# Patient Record
Sex: Female | Born: 1955 | Race: White | Hispanic: No | Marital: Married | Smoking: Former smoker
Health system: Southern US, Community
[De-identification: ages and names within clinical notes are randomized; demographics above are authoritative.]

## PROBLEM LIST (undated history)

## (undated) DIAGNOSIS — G9341 Metabolic encephalopathy: Secondary | ICD-10-CM

## (undated) DIAGNOSIS — E7800 Pure hypercholesterolemia, unspecified: Secondary | ICD-10-CM

## (undated) DIAGNOSIS — J939 Pneumothorax, unspecified: Secondary | ICD-10-CM

## (undated) DIAGNOSIS — U071 COVID-19: Secondary | ICD-10-CM

## (undated) DIAGNOSIS — J1282 Pneumonia due to coronavirus disease 2019: Secondary | ICD-10-CM

## (undated) DIAGNOSIS — K219 Gastro-esophageal reflux disease without esophagitis: Secondary | ICD-10-CM

## (undated) DIAGNOSIS — J9621 Acute and chronic respiratory failure with hypoxia: Secondary | ICD-10-CM

## (undated) DIAGNOSIS — Z972 Presence of dental prosthetic device (complete) (partial): Secondary | ICD-10-CM

## (undated) DIAGNOSIS — N200 Calculus of kidney: Secondary | ICD-10-CM

## (undated) HISTORY — PX: KIDNEY STONE SURGERY: SHX686

## (undated) HISTORY — PX: BREAST BIOPSY: SHX20

## (undated) HISTORY — PX: BREAST EXCISIONAL BIOPSY: SUR124

## (undated) HISTORY — PX: COLONOSCOPY: SHX174

## (undated) HISTORY — PX: ABDOMINAL HYSTERECTOMY: SHX81

---

## 2009-02-21 ENCOUNTER — Ambulatory Visit (HOSPITAL_COMMUNITY): Admission: RE | Admit: 2009-02-21 | Discharge: 2009-02-21 | Payer: Self-pay | Admitting: Family Medicine

## 2009-02-26 ENCOUNTER — Encounter: Admission: RE | Admit: 2009-02-26 | Discharge: 2009-02-26 | Payer: Self-pay | Admitting: Family Medicine

## 2009-02-26 ENCOUNTER — Encounter (INDEPENDENT_AMBULATORY_CARE_PROVIDER_SITE_OTHER): Payer: Self-pay | Admitting: Diagnostic Radiology

## 2009-03-25 ENCOUNTER — Ambulatory Visit (HOSPITAL_BASED_OUTPATIENT_CLINIC_OR_DEPARTMENT_OTHER): Admission: RE | Admit: 2009-03-25 | Discharge: 2009-03-25 | Payer: Self-pay | Admitting: Surgery

## 2009-03-25 ENCOUNTER — Encounter: Admission: RE | Admit: 2009-03-25 | Discharge: 2009-03-25 | Payer: Self-pay | Admitting: Surgery

## 2010-02-24 ENCOUNTER — Encounter: Admission: RE | Admit: 2010-02-24 | Discharge: 2010-02-24 | Payer: Self-pay | Admitting: Family Medicine

## 2010-07-15 LAB — BASIC METABOLIC PANEL
BUN: 7 mg/dL (ref 6–23)
CO2: 28 mEq/L (ref 19–32)
Calcium: 9.6 mg/dL (ref 8.4–10.5)
Chloride: 104 mEq/L (ref 96–112)
Creatinine, Ser: 0.74 mg/dL (ref 0.4–1.2)
GFR calc Af Amer: >60 mL/min (ref >60)
GFR calc non Af Amer: >60 mL/min (ref >60)
Glucose, Bld: 93 mg/dL (ref 70–99)
Potassium: 4.4 mEq/L (ref 3.5–5.1)
Sodium: 139 mEq/L (ref 135–145)

## 2010-07-15 LAB — DIFFERENTIAL
Basophils Absolute: 0 10*3/uL (ref 0.0–0.1)
Basophils Relative: 1 % (ref 0–1)
Eosinophils Absolute: 0.1 10*3/uL (ref 0.0–0.7)
Eosinophils Relative: 2 % (ref 0–5)
Lymphocytes Relative: 43 % (ref 12–46)
Lymphs Abs: 2.4 10*3/uL (ref 0.7–4.0)
Monocytes Absolute: 0.6 10*3/uL (ref 0.1–1.0)
Monocytes Relative: 11 % (ref 3–12)
Neutro Abs: 2.4 10*3/uL (ref 1.7–7.7)
Neutrophils Relative %: 43 % (ref 43–77)

## 2010-07-15 LAB — CBC
HCT: 40.1 % (ref 36.0–46.0)
Hemoglobin: 14 g/dL (ref 12.0–15.0)
MCHC: 34.8 g/dL (ref 30.0–36.0)
MCV: 90.6 fL (ref 78.0–100.0)
Platelets: 223 10*3/uL (ref 150–400)
RBC: 4.43 MIL/uL (ref 3.87–5.11)
RDW: 12.2 % (ref 11.5–15.5)
WBC: 5.5 10*3/uL (ref 4.0–10.5)

## 2011-01-08 ENCOUNTER — Other Ambulatory Visit: Payer: Self-pay | Admitting: Family Medicine

## 2011-01-08 DIAGNOSIS — Z1231 Encounter for screening mammogram for malignant neoplasm of breast: Secondary | ICD-10-CM

## 2011-02-26 ENCOUNTER — Ambulatory Visit
Admission: RE | Admit: 2011-02-26 | Discharge: 2011-02-26 | Disposition: A | Discharge status: Home / Self Care | Payer: 59 | Source: Ambulatory Visit | Attending: Family Medicine | Admitting: Family Medicine

## 2011-02-26 DIAGNOSIS — Z1231 Encounter for screening mammogram for malignant neoplasm of breast: Secondary | ICD-10-CM

## 2012-01-18 ENCOUNTER — Other Ambulatory Visit: Payer: Self-pay | Admitting: Family Medicine

## 2012-01-18 DIAGNOSIS — Z1231 Encounter for screening mammogram for malignant neoplasm of breast: Secondary | ICD-10-CM

## 2012-02-29 ENCOUNTER — Ambulatory Visit
Admission: RE | Admit: 2012-02-29 | Discharge: 2012-02-29 | Disposition: A | Discharge status: Home / Self Care | Payer: 59 | Source: Ambulatory Visit | Attending: Family Medicine | Admitting: Family Medicine

## 2012-02-29 DIAGNOSIS — Z1231 Encounter for screening mammogram for malignant neoplasm of breast: Secondary | ICD-10-CM

## 2012-03-04 ENCOUNTER — Other Ambulatory Visit: Payer: Self-pay | Admitting: Family Medicine

## 2012-03-04 DIAGNOSIS — R928 Other abnormal and inconclusive findings on diagnostic imaging of breast: Secondary | ICD-10-CM

## 2012-03-15 ENCOUNTER — Ambulatory Visit
Admission: RE | Admit: 2012-03-15 | Discharge: 2012-03-15 | Disposition: A | Discharge status: Home / Self Care | Payer: 59 | Source: Ambulatory Visit | Attending: Family Medicine | Admitting: Family Medicine

## 2012-03-15 DIAGNOSIS — R928 Other abnormal and inconclusive findings on diagnostic imaging of breast: Secondary | ICD-10-CM

## 2013-02-03 ENCOUNTER — Other Ambulatory Visit: Payer: Self-pay

## 2013-02-03 DIAGNOSIS — Z1231 Encounter for screening mammogram for malignant neoplasm of breast: Secondary | ICD-10-CM

## 2013-03-07 ENCOUNTER — Ambulatory Visit
Admission: RE | Admit: 2013-03-07 | Discharge: 2013-03-07 | Disposition: A | Discharge status: Home / Self Care | Payer: BC Managed Care – PPO | Source: Ambulatory Visit

## 2013-03-07 DIAGNOSIS — Z1231 Encounter for screening mammogram for malignant neoplasm of breast: Secondary | ICD-10-CM

## 2014-01-31 ENCOUNTER — Other Ambulatory Visit: Payer: Self-pay

## 2014-01-31 DIAGNOSIS — Z1231 Encounter for screening mammogram for malignant neoplasm of breast: Secondary | ICD-10-CM

## 2014-03-12 ENCOUNTER — Ambulatory Visit
Admission: RE | Admit: 2014-03-12 | Discharge: 2014-03-12 | Disposition: A | Discharge status: Home / Self Care | Payer: BC Managed Care – PPO | Source: Ambulatory Visit

## 2014-03-12 ENCOUNTER — Encounter (INDEPENDENT_AMBULATORY_CARE_PROVIDER_SITE_OTHER): Payer: Self-pay

## 2014-03-12 DIAGNOSIS — Z1231 Encounter for screening mammogram for malignant neoplasm of breast: Secondary | ICD-10-CM

## 2015-02-08 ENCOUNTER — Other Ambulatory Visit: Payer: Self-pay

## 2015-02-08 DIAGNOSIS — Z1231 Encounter for screening mammogram for malignant neoplasm of breast: Secondary | ICD-10-CM

## 2015-03-19 ENCOUNTER — Ambulatory Visit
Admission: RE | Admit: 2015-03-19 | Discharge: 2015-03-19 | Disposition: A | Discharge status: Home / Self Care | Payer: BLUE CROSS/BLUE SHIELD | Source: Ambulatory Visit

## 2015-03-19 DIAGNOSIS — Z1231 Encounter for screening mammogram for malignant neoplasm of breast: Secondary | ICD-10-CM

## 2015-04-10 ENCOUNTER — Telehealth: Payer: Self-pay | Admitting: Gastroenterology

## 2015-04-10 NOTE — Telephone Encounter (Signed)
colonoscopy

## 2015-04-12 ENCOUNTER — Encounter: Payer: Self-pay | Admitting: *Deleted

## 2015-04-12 ENCOUNTER — Other Ambulatory Visit: Payer: Self-pay

## 2015-04-12 NOTE — Telephone Encounter (Signed)
Gastroenterology Pre-Procedure Review  Request Date: 04/22/15 Requesting Physician:   PATIENT REVIEW QUESTIONS: The patient responded to the following health history questions as indicated:    1. Are you having any GI issues? no 2. Do you have a personal history of Polyps? yes (last done Feb 21, 2007) 3. Do you have a family history of Colon Cancer or Polyps? no 4. Diabetes Mellitus? no 5. Joint replacements in the past 12 months?no 6. Major health problems in the past 3 months?no 7. Any artificial heart valves, MVP, or defibrillator?no    MEDICATIONS & ALLERGIES:    Patient reports the following regarding taking any anticoagulation/antiplatelet therapy:   Plavix, Coumadin, Eliquis, Xarelto, Lovenox, Pradaxa, Brilinta, or Effient? no Aspirin? no  Patient confirms/reports the following medications:  Current Outpatient Prescriptions  Medication Sig Dispense Refill  . Krill Oil 1000 MG CAPS Take by mouth.     No current facility-administered medications for this visit.    Patient confirms/reports the following allergies:  No Known Allergies  No orders of the defined types were placed in this encounter.    AUTHORIZATION INFORMATION Primary Insurance: 1D#: Group #:  Secondary Insurance: 1D#: Group #:  SCHEDULE INFORMATION: Date: 04/22/15 Time: Location:  [facility]

## 2015-04-18 NOTE — Discharge Instructions (Signed)

## 2015-04-22 ENCOUNTER — Ambulatory Visit: Payer: BLUE CROSS/BLUE SHIELD | Admitting: Anesthesiology

## 2015-04-22 ENCOUNTER — Ambulatory Visit
Admission: RE | Admit: 2015-04-22 | Discharge: 2015-04-22 | Disposition: A | Discharge status: Home / Self Care | Payer: BLUE CROSS/BLUE SHIELD | Source: Ambulatory Visit | Attending: Gastroenterology | Admitting: Gastroenterology

## 2015-04-22 ENCOUNTER — Encounter
Admission: RE | Disposition: A | Discharge status: Home / Self Care | Payer: Self-pay | Source: Ambulatory Visit | Attending: Gastroenterology

## 2015-04-22 DIAGNOSIS — K219 Gastro-esophageal reflux disease without esophagitis: Secondary | ICD-10-CM | POA: Diagnosis not present

## 2015-04-22 DIAGNOSIS — K641 Second degree hemorrhoids: Secondary | ICD-10-CM | POA: Insufficient documentation

## 2015-04-22 DIAGNOSIS — Z1211 Encounter for screening for malignant neoplasm of colon: Secondary | ICD-10-CM | POA: Insufficient documentation

## 2015-04-22 DIAGNOSIS — Z90710 Acquired absence of both cervix and uterus: Secondary | ICD-10-CM | POA: Insufficient documentation

## 2015-04-22 DIAGNOSIS — E7800 Pure hypercholesterolemia, unspecified: Secondary | ICD-10-CM | POA: Insufficient documentation

## 2015-04-22 DIAGNOSIS — K5730 Diverticulosis of large intestine without perforation or abscess without bleeding: Secondary | ICD-10-CM | POA: Diagnosis not present

## 2015-04-22 DIAGNOSIS — Z87891 Personal history of nicotine dependence: Secondary | ICD-10-CM | POA: Insufficient documentation

## 2015-04-22 HISTORY — DX: Calculus of kidney: N20.0

## 2015-04-22 HISTORY — DX: Pure hypercholesterolemia, unspecified: E78.00

## 2015-04-22 HISTORY — PX: COLONOSCOPY WITH PROPOFOL: SHX5780

## 2015-04-22 HISTORY — DX: Gastro-esophageal reflux disease without esophagitis: K21.9

## 2015-04-22 HISTORY — DX: Presence of dental prosthetic device (complete) (partial): Z97.2

## 2015-04-22 SURGERY — COLONOSCOPY WITH PROPOFOL
Anesthesia: Monitor Anesthesia Care | Wound class: Contaminated

## 2015-04-22 MED ORDER — SIMETHICONE 40 MG/0.6ML PO SUSP
ORAL | Status: DC | PRN
  Administered 2015-04-22: 12:00:00

## 2015-04-22 MED ORDER — LIDOCAINE HCL (CARDIAC) 20 MG/ML IV SOLN
INTRAVENOUS | Status: DC | PRN
  Administered 2015-04-22: 20 mg via INTRAVENOUS

## 2015-04-22 MED ORDER — ACETAMINOPHEN 325 MG PO TABS: 325.0000 mg | ORAL_TABLET | ORAL | Status: DC | PRN

## 2015-04-22 MED ORDER — LACTATED RINGERS IV SOLN
INTRAVENOUS | Status: DC
  Administered 2015-04-22: 11:00:00 via INTRAVENOUS

## 2015-04-22 MED ORDER — ACETAMINOPHEN 160 MG/5ML PO SOLN: 325.0000 mg | ORAL | Status: DC | PRN

## 2015-04-22 MED ORDER — PROPOFOL 10 MG/ML IV BOLUS
INTRAVENOUS | Status: DC | PRN
  Administered 2015-04-22: 100 mg via INTRAVENOUS
  Administered 2015-04-22 (×4): 20 mg via INTRAVENOUS
  Administered 2015-04-22: 40 mg via INTRAVENOUS
  Administered 2015-04-22 (×2): 20 mg via INTRAVENOUS

## 2015-04-22 SURGICAL SUPPLY — 28 items

## 2015-04-22 NOTE — H&P (Signed)
  [facility]  [address] [place], [place] [address] Phone: [phone] Fax : [phone]  Primary Care Physician:  No primary care provider on file. Primary Gastroenterologist:  Dr. Servando SnareWohl  Pre-Procedure History & Physical: HPI:  Rachel [name] is a 60 y.o. female is here for a screening colonoscopy.   Past Medical History  Diagnosis Date  . GERD (gastroesophageal reflux disease)   . Wears dentures     full upper, partial lower  . Kidney stones   . Hypercholesteremia     Past Surgical History  Procedure Laterality Date  . Abdominal hysterectomy    . Colonoscopy    . Kidney stone surgery      Prior to Admission medications   Medication Sig Start Date End Date Taking? Authorizing Provider  Ca Carbonate-Mag Hydroxide (ROLAIDS PO) Take by mouth.   Yes Historical Provider, MD  Boris LownKrill Oil 1000 MG CAPS Take by mouth.   Yes Historical Provider, MD    Allergies as of 04/12/2015  . (No Known Allergies)    History reviewed. No pertinent family history.  Social History   Social History  . Marital Status: Married    Spouse Name: N/A  . Number of Children: N/A  . Years of Education: N/A   Occupational History  . Not on file.   Social History Main Topics  . Smoking status: Former Smoker    Types: Cigarettes  . Smokeless tobacco: Not on file     Comment: quit 2005  . Alcohol Use: No  . Drug Use: Not on file  . Sexual Activity: Not on file   Other Topics Concern  . Not on file   Social History Narrative    Review of Systems: See HPI, otherwise negative ROS  Physical Exam: BP 142/86 mmHg  Pulse 69  Temp(Src) 97.9 F (36.6 C)  Resp 16  Ht 5\' 4"  (1.626 m)  Wt 141 lb (63.957 kg)  BMI 24.19 kg/m2  SpO2 98% General:   Alert,  pleasant and cooperative in NAD Head:  Normocephalic and atraumatic. Neck:  Supple; no masses or thyromegaly. Lungs:  Clear throughout to auscultation.    Heart:  Regular rate and rhythm. Abdomen:  Soft,  nontender and nondistended. Normal bowel sounds, without guarding, and without rebound.   Neurologic:  Alert and  oriented x4;  grossly normal neurologically.  Impression/Plan: Rachel [name] is now here to undergo a screening colonoscopy.  Risks, benefits, and alternatives regarding colonoscopy have been reviewed with the patient.  Questions have been answered.  All parties agreeable.

## 2015-04-22 NOTE — Transfer of Care (Signed)
Immediate Anesthesia Transfer of Care Note  Patient: Rachel [name]  Procedure(s) Performed: Procedure(s): COLONOSCOPY WITH PROPOFOL (N/A)  Patient Location: PACU  Anesthesia Type: MAC  Level of Consciousness: awake, alert  and patient cooperative  Airway and Oxygen Therapy: Patient Spontanous Breathing and Patient connected to supplemental oxygen  Post-op Assessment: Post-op Vital signs reviewed, Patient's Cardiovascular Status Stable, Respiratory Function Stable, Patent Airway and No signs of Nausea or vomiting  Post-op Vital Signs: Reviewed and stable  Complications: No apparent anesthesia complications

## 2015-04-22 NOTE — Op Note (Signed)
[facility] Gastroenterology Patient Name: Rachel [name] Procedure Date: 04/22/2015 12:12 PM MRN: [id number] Account #: [id number] Date of Birth: [birth year] Admit Type: Outpatient Age: 60 Room: [facility] OR ROOM 01 Gender: Female Note Status: Finalized Procedure:         Colonoscopy Indications:       Screening for colorectal malignant neoplasm Providers:         Midge Minium, MD Medicines:         Propofol per Anesthesia Complications:     No immediate complications. Procedure:         Pre-Anesthesia Assessment:                    - Prior to the procedure, a History and Physical was                     performed, and patient medications and allergies were                     reviewed. The patient's tolerance of previous anesthesia                     was also reviewed. The risks and benefits of the procedure                     and the sedation options and risks were discussed with the                     patient. All questions were answered, and informed consent                     was obtained. Prior Anticoagulants: The patient has taken                     no previous anticoagulant or antiplatelet agents. ASA                     Grade Assessment: II - A patient with mild systemic                     disease. After reviewing the risks and benefits, the                     patient was deemed in satisfactory condition to undergo                     the procedure.                    After obtaining informed consent, the colonoscope was                     passed under direct vision. Throughout the procedure, the                     patient's blood pressure, pulse, and oxygen saturations                     were monitored continuously. The was introduced through                     the anus and advanced to the the cecum, identified by                     appendiceal orifice and ileocecal valve. The  colonoscopy                     was performed without difficulty. The  patient tolerated                     the procedure well. The quality of the bowel preparation                     was excellent. Findings:      The perianal and digital rectal examinations were normal.      A few small-mouthed diverticula were found in the entire colon.      Non-bleeding internal hemorrhoids were found during retroflexion. The       hemorrhoids were Grade II (internal hemorrhoids that prolapse but reduce       spontaneously). Impression:        - Diverticulosis in the entire examined colon.                    - Non-bleeding internal hemorrhoids.                    - No specimens collected. Recommendation:    - Repeat colonoscopy in 10 years for screening unless any                     change in family history or lower GI problems. Procedure Code(s): --- Professional ---                    [phone], Colonoscopy, flexible; diagnostic, including                     collection of specimen(s) by brushing or washing, when                     performed (separate procedure) Diagnosis Code(s): --- Professional ---                    Z12.11, Encounter for screening for malignant neoplasm of                     colon CPT copyright 2014 American Medical Association. All rights reserved. The codes documented in this report are preliminary and upon coder review may  be revised to meet current compliance requirements. Midge Miniumarren Dayson Aboud, MD 04/22/2015 12:37:27 PM This report has been signed electronically. Number of Addenda: 0 Note Initiated On: 04/22/2015 12:12 PM Scope Withdrawal Time: 0 hours 5 minutes 4 seconds  Total Procedure Duration: 0 hours 11 minutes 34 seconds       [facility]

## 2015-04-22 NOTE — Anesthesia Postprocedure Evaluation (Signed)
Anesthesia Post Note  Patient: Corwin LevinsVickie M Raffield  Procedure(s) Performed: Procedure(s) (LRB): COLONOSCOPY WITH PROPOFOL (N/A)  Patient location during evaluation: PACU Anesthesia Type: MAC Level of consciousness: awake and alert and oriented Pain management: satisfactory to patient Vital Signs Assessment: post-procedure vital signs reviewed and stable Respiratory status: spontaneous breathing, nonlabored ventilation and respiratory function stable Cardiovascular status: blood pressure returned to baseline and stable Postop Assessment: Adequate PO intake and No signs of nausea or vomiting Anesthetic complications: no    Cherly BeachStella, Trinidad Ingle J

## 2015-04-22 NOTE — Anesthesia Preprocedure Evaluation (Signed)
Anesthesia Evaluation  Patient identified by MRN, date of birth, ID band  Reviewed: Allergy & Precautions, H&P , NPO status , Patient's Chart, lab work & pertinent test results  Airway Mallampati: II  TM Distance: >3 FB Neck ROM: full    Dental no notable dental hx.    Pulmonary former smoker,    Pulmonary exam normal        Cardiovascular  Rhythm:regular Rate:Normal     Neuro/Psych    GI/Hepatic GERD  ,  Endo/Other    Renal/GU      Musculoskeletal   Abdominal   Peds  Hematology   Anesthesia Other Findings   Reproductive/Obstetrics                             Anesthesia Physical Anesthesia Plan  ASA: II  Anesthesia Plan: MAC   Post-op Pain Management:    Induction:   Airway Management Planned:   Additional Equipment:   Intra-op Plan:   Post-operative Plan:   Informed Consent: I have reviewed the patients History and Physical, chart, labs and discussed the procedure including the risks, benefits and alternatives for the proposed anesthesia with the patient or authorized representative who has indicated his/her understanding and acceptance.     Plan Discussed with: CRNA  Anesthesia Plan Comments:         Anesthesia Quick Evaluation  

## 2015-04-22 NOTE — Anesthesia Procedure Notes (Signed)
Procedure Name: MAC Performed by: Lorana Maffeo Pre-anesthesia Checklist: Patient identified, Emergency Drugs available, Suction available, Patient being monitored and Timeout performed Patient Re-evaluated:Patient Re-evaluated prior to inductionOxygen Delivery Method: Nasal cannula       

## 2015-04-23 ENCOUNTER — Encounter: Payer: Self-pay | Admitting: Gastroenterology

## 2016-02-04 ENCOUNTER — Other Ambulatory Visit: Payer: Self-pay | Admitting: Physician Assistant

## 2016-02-04 DIAGNOSIS — Z1231 Encounter for screening mammogram for malignant neoplasm of breast: Secondary | ICD-10-CM

## 2016-03-23 ENCOUNTER — Ambulatory Visit
Admission: RE | Admit: 2016-03-23 | Discharge: 2016-03-23 | Disposition: A | Discharge status: Home / Self Care | Payer: BLUE CROSS/BLUE SHIELD | Source: Ambulatory Visit | Attending: Physician Assistant | Admitting: Physician Assistant

## 2016-03-23 DIAGNOSIS — Z1231 Encounter for screening mammogram for malignant neoplasm of breast: Secondary | ICD-10-CM

## 2017-02-08 ENCOUNTER — Other Ambulatory Visit: Payer: Self-pay | Admitting: Physician Assistant

## 2017-02-08 DIAGNOSIS — Z1231 Encounter for screening mammogram for malignant neoplasm of breast: Secondary | ICD-10-CM

## 2017-03-24 ENCOUNTER — Ambulatory Visit
Admission: RE | Admit: 2017-03-24 | Discharge: 2017-03-24 | Disposition: A | Discharge status: Home / Self Care | Payer: BLUE CROSS/BLUE SHIELD | Source: Ambulatory Visit | Attending: Physician Assistant | Admitting: Physician Assistant

## 2017-03-24 DIAGNOSIS — Z1231 Encounter for screening mammogram for malignant neoplasm of breast: Secondary | ICD-10-CM

## 2018-02-03 ENCOUNTER — Other Ambulatory Visit: Payer: Self-pay | Admitting: Physician Assistant

## 2018-02-03 DIAGNOSIS — Z1231 Encounter for screening mammogram for malignant neoplasm of breast: Secondary | ICD-10-CM

## 2018-03-28 ENCOUNTER — Other Ambulatory Visit: Payer: Self-pay | Admitting: Family Medicine

## 2018-03-28 ENCOUNTER — Ambulatory Visit
Admission: RE | Admit: 2018-03-28 | Discharge: 2018-03-28 | Disposition: A | Discharge status: Home / Self Care | Payer: BLUE CROSS/BLUE SHIELD | Source: Ambulatory Visit | Attending: Physician Assistant | Admitting: Physician Assistant

## 2018-03-28 DIAGNOSIS — Z1231 Encounter for screening mammogram for malignant neoplasm of breast: Secondary | ICD-10-CM

## 2019-02-24 ENCOUNTER — Other Ambulatory Visit: Payer: Self-pay | Admitting: Family Medicine

## 2019-02-24 DIAGNOSIS — Z1231 Encounter for screening mammogram for malignant neoplasm of breast: Secondary | ICD-10-CM

## 2019-04-21 ENCOUNTER — Ambulatory Visit
Admission: RE | Admit: 2019-04-21 | Discharge: 2019-04-21 | Disposition: A | Discharge status: Home / Self Care | Payer: BLUE CROSS/BLUE SHIELD | Source: Ambulatory Visit | Attending: Family Medicine | Admitting: Family Medicine

## 2019-04-21 ENCOUNTER — Other Ambulatory Visit: Payer: Self-pay

## 2019-04-21 DIAGNOSIS — Z1231 Encounter for screening mammogram for malignant neoplasm of breast: Secondary | ICD-10-CM

## 2020-05-31 ENCOUNTER — Emergency Department (HOSPITAL_COMMUNITY): Payer: BLUE CROSS/BLUE SHIELD

## 2020-05-31 ENCOUNTER — Encounter (HOSPITAL_COMMUNITY): Payer: Self-pay | Admitting: Radiology

## 2020-05-31 ENCOUNTER — Inpatient Hospital Stay (HOSPITAL_COMMUNITY)
Admission: EM | Admit: 2020-05-31 | Discharge: 2020-07-09 | DRG: 004 | Disposition: A | Discharge status: Other Acute Inpatient Hospital | Payer: BLUE CROSS/BLUE SHIELD | Attending: Pulmonary Disease | Admitting: Pulmonary Disease

## 2020-05-31 DIAGNOSIS — Z9911 Dependence on respirator [ventilator] status: Secondary | ICD-10-CM

## 2020-05-31 DIAGNOSIS — J80 Acute respiratory distress syndrome: Secondary | ICD-10-CM | POA: Diagnosis not present

## 2020-05-31 DIAGNOSIS — J13 Pneumonia due to Streptococcus pneumoniae: Secondary | ICD-10-CM | POA: Diagnosis not present

## 2020-05-31 DIAGNOSIS — R0689 Other abnormalities of breathing: Secondary | ICD-10-CM | POA: Diagnosis not present

## 2020-05-31 DIAGNOSIS — J984 Other disorders of lung: Secondary | ICD-10-CM | POA: Diagnosis not present

## 2020-05-31 DIAGNOSIS — E860 Dehydration: Secondary | ICD-10-CM | POA: Diagnosis present

## 2020-05-31 DIAGNOSIS — E870 Hyperosmolality and hypernatremia: Secondary | ICD-10-CM | POA: Diagnosis not present

## 2020-05-31 DIAGNOSIS — J939 Pneumothorax, unspecified: Secondary | ICD-10-CM | POA: Diagnosis not present

## 2020-05-31 DIAGNOSIS — E872 Acidosis: Secondary | ICD-10-CM | POA: Diagnosis not present

## 2020-05-31 DIAGNOSIS — R739 Hyperglycemia, unspecified: Secondary | ICD-10-CM | POA: Diagnosis not present

## 2020-05-31 DIAGNOSIS — E162 Hypoglycemia, unspecified: Secondary | ICD-10-CM | POA: Diagnosis not present

## 2020-05-31 DIAGNOSIS — R7989 Other specified abnormal findings of blood chemistry: Secondary | ICD-10-CM

## 2020-05-31 DIAGNOSIS — R042 Hemoptysis: Secondary | ICD-10-CM | POA: Diagnosis not present

## 2020-05-31 DIAGNOSIS — E876 Hypokalemia: Secondary | ICD-10-CM | POA: Diagnosis present

## 2020-05-31 DIAGNOSIS — Z452 Encounter for adjustment and management of vascular access device: Secondary | ICD-10-CM

## 2020-05-31 DIAGNOSIS — N200 Calculus of kidney: Secondary | ICD-10-CM

## 2020-05-31 DIAGNOSIS — Z66 Do not resuscitate: Secondary | ICD-10-CM

## 2020-05-31 DIAGNOSIS — J189 Pneumonia, unspecified organism: Secondary | ICD-10-CM | POA: Diagnosis not present

## 2020-05-31 DIAGNOSIS — D696 Thrombocytopenia, unspecified: Secondary | ICD-10-CM | POA: Diagnosis not present

## 2020-05-31 DIAGNOSIS — E875 Hyperkalemia: Secondary | ICD-10-CM | POA: Diagnosis not present

## 2020-05-31 DIAGNOSIS — E871 Hypo-osmolality and hyponatremia: Secondary | ICD-10-CM | POA: Diagnosis present

## 2020-05-31 DIAGNOSIS — Z930 Tracheostomy status: Secondary | ICD-10-CM

## 2020-05-31 DIAGNOSIS — G9341 Metabolic encephalopathy: Secondary | ICD-10-CM | POA: Diagnosis present

## 2020-05-31 DIAGNOSIS — U071 COVID-19: Secondary | ICD-10-CM | POA: Diagnosis not present

## 2020-05-31 DIAGNOSIS — I959 Hypotension, unspecified: Secondary | ICD-10-CM | POA: Diagnosis not present

## 2020-05-31 DIAGNOSIS — G4700 Insomnia, unspecified: Secondary | ICD-10-CM | POA: Diagnosis not present

## 2020-05-31 DIAGNOSIS — Y95 Nosocomial condition: Secondary | ICD-10-CM | POA: Diagnosis not present

## 2020-05-31 DIAGNOSIS — E785 Hyperlipidemia, unspecified: Secondary | ICD-10-CM | POA: Diagnosis present

## 2020-05-31 DIAGNOSIS — R578 Other shock: Secondary | ICD-10-CM | POA: Diagnosis not present

## 2020-05-31 DIAGNOSIS — R0600 Dyspnea, unspecified: Secondary | ICD-10-CM

## 2020-05-31 DIAGNOSIS — K5900 Constipation, unspecified: Secondary | ICD-10-CM | POA: Diagnosis not present

## 2020-05-31 DIAGNOSIS — I4510 Unspecified right bundle-branch block: Secondary | ICD-10-CM | POA: Diagnosis present

## 2020-05-31 DIAGNOSIS — R069 Unspecified abnormalities of breathing: Secondary | ICD-10-CM

## 2020-05-31 DIAGNOSIS — J9312 Secondary spontaneous pneumothorax: Secondary | ICD-10-CM | POA: Diagnosis not present

## 2020-05-31 DIAGNOSIS — E861 Hypovolemia: Secondary | ICD-10-CM | POA: Diagnosis present

## 2020-05-31 DIAGNOSIS — J9621 Acute and chronic respiratory failure with hypoxia: Secondary | ICD-10-CM | POA: Diagnosis not present

## 2020-05-31 DIAGNOSIS — R040 Epistaxis: Secondary | ICD-10-CM | POA: Diagnosis not present

## 2020-05-31 DIAGNOSIS — R0603 Acute respiratory distress: Secondary | ICD-10-CM | POA: Diagnosis not present

## 2020-05-31 DIAGNOSIS — R0902 Hypoxemia: Secondary | ICD-10-CM

## 2020-05-31 DIAGNOSIS — A419 Sepsis, unspecified organism: Secondary | ICD-10-CM | POA: Diagnosis not present

## 2020-05-31 DIAGNOSIS — N170 Acute kidney failure with tubular necrosis: Secondary | ICD-10-CM | POA: Diagnosis not present

## 2020-05-31 DIAGNOSIS — J1282 Pneumonia due to coronavirus disease 2019: Secondary | ICD-10-CM | POA: Diagnosis not present

## 2020-05-31 DIAGNOSIS — R6521 Severe sepsis with septic shock: Secondary | ICD-10-CM | POA: Diagnosis not present

## 2020-05-31 DIAGNOSIS — J9382 Other air leak: Secondary | ICD-10-CM | POA: Diagnosis not present

## 2020-05-31 DIAGNOSIS — J9602 Acute respiratory failure with hypercapnia: Secondary | ICD-10-CM | POA: Diagnosis not present

## 2020-05-31 DIAGNOSIS — J9601 Acute respiratory failure with hypoxia: Secondary | ICD-10-CM

## 2020-05-31 DIAGNOSIS — K219 Gastro-esophageal reflux disease without esophagitis: Secondary | ICD-10-CM | POA: Diagnosis not present

## 2020-05-31 DIAGNOSIS — Z87442 Personal history of urinary calculi: Secondary | ICD-10-CM

## 2020-05-31 DIAGNOSIS — R339 Retention of urine, unspecified: Secondary | ICD-10-CM | POA: Diagnosis not present

## 2020-05-31 DIAGNOSIS — R0602 Shortness of breath: Secondary | ICD-10-CM

## 2020-05-31 DIAGNOSIS — Z90710 Acquired absence of both cervix and uterus: Secondary | ICD-10-CM

## 2020-05-31 DIAGNOSIS — D6489 Other specified anemias: Secondary | ICD-10-CM | POA: Diagnosis present

## 2020-05-31 DIAGNOSIS — Z7189 Other specified counseling: Secondary | ICD-10-CM | POA: Diagnosis not present

## 2020-05-31 DIAGNOSIS — J9690 Respiratory failure, unspecified, unspecified whether with hypoxia or hypercapnia: Secondary | ICD-10-CM | POA: Diagnosis present

## 2020-05-31 DIAGNOSIS — Z87891 Personal history of nicotine dependence: Secondary | ICD-10-CM

## 2020-05-31 DIAGNOSIS — Z01818 Encounter for other preprocedural examination: Secondary | ICD-10-CM

## 2020-05-31 DIAGNOSIS — T380X5A Adverse effect of glucocorticoids and synthetic analogues, initial encounter: Secondary | ICD-10-CM | POA: Diagnosis not present

## 2020-05-31 DIAGNOSIS — Z4682 Encounter for fitting and adjustment of non-vascular catheter: Secondary | ICD-10-CM

## 2020-05-31 LAB — TROPONIN I (HIGH SENSITIVITY)
Troponin I (High Sensitivity): 15 ng/L (ref <18)
Troponin I (High Sensitivity): 18 ng/L — ABNORMAL HIGH (ref <18)

## 2020-05-31 LAB — COMPREHENSIVE METABOLIC PANEL
ALT: 21 U/L (ref 0–44)
AST: 32 U/L (ref 15–41)
Albumin: 3.7 g/dL (ref 3.5–5.0)
Alkaline Phosphatase: 50 U/L (ref 38–126)
Anion gap: 9 (ref 5–15)
BUN: 12 mg/dL (ref 8–23)
CO2: 23 mmol/L (ref 22–32)
Calcium: 9 mg/dL (ref 8.9–10.3)
Chloride: 99 mmol/L (ref 98–111)
Creatinine, Ser: 0.79 mg/dL (ref 0.44–1.00)
GFR, Estimated: >60 mL/min (ref >60)
Glucose, Bld: 106 mg/dL — ABNORMAL HIGH (ref 70–99)
Potassium: 3 mmol/L — ABNORMAL LOW (ref 3.5–5.1)
Sodium: 131 mmol/L — ABNORMAL LOW (ref 135–145)
Total Bilirubin: 0.9 mg/dL (ref 0.3–1.2)
Total Protein: 7.3 g/dL (ref 6.5–8.1)

## 2020-05-31 LAB — BRAIN NATRIURETIC PEPTIDE: B Natriuretic Peptide: 76 pg/mL (ref 0.0–100.0)

## 2020-05-31 LAB — CBC WITH DIFFERENTIAL/PLATELET
Abs Immature Granulocytes: 0.05 10*3/uL (ref 0.00–0.07)
Basophils Absolute: 0 10*3/uL (ref 0.0–0.1)
Basophils Relative: 0 %
Eosinophils Absolute: 0.1 10*3/uL (ref 0.0–0.5)
Eosinophils Relative: 1 %
HCT: 36.7 % (ref 36.0–46.0)
Hemoglobin: 11.9 g/dL — ABNORMAL LOW (ref 12.0–15.0)
Immature Granulocytes: 1 %
Lymphocytes Relative: 11 %
Lymphs Abs: 0.9 10*3/uL (ref 0.7–4.0)
MCH: 28.8 pg (ref 26.0–34.0)
MCHC: 32.4 g/dL (ref 30.0–36.0)
MCV: 88.9 fL (ref 80.0–100.0)
Monocytes Absolute: 0.8 10*3/uL (ref 0.1–1.0)
Monocytes Relative: 9 %
Neutro Abs: 7 10*3/uL (ref 1.7–7.7)
Neutrophils Relative %: 78 %
Platelets: 240 10*3/uL (ref 150–400)
RBC: 4.13 MIL/uL (ref 3.87–5.11)
RDW: 13.4 % (ref 11.5–15.5)
WBC: 8.8 10*3/uL (ref 4.0–10.5)
nRBC: 0 % (ref 0.0–0.2)

## 2020-05-31 LAB — POC SARS CORONAVIRUS 2 AG -  ED: SARS Coronavirus 2 Ag: POSITIVE — AB

## 2020-05-31 LAB — FERRITIN: Ferritin: 734 ng/mL — ABNORMAL HIGH (ref 11–307)

## 2020-05-31 LAB — C-REACTIVE PROTEIN: CRP: 19.1 mg/dL — ABNORMAL HIGH (ref <1.0)

## 2020-05-31 LAB — D-DIMER, QUANTITATIVE: D-Dimer, Quant: 1.44 ug/mL-FEU — ABNORMAL HIGH (ref 0.00–0.50)

## 2020-05-31 MED ORDER — METHYLPREDNISOLONE SODIUM SUCC 40 MG IJ SOLR
40.0000 mg | Freq: Two times a day (BID) | INTRAMUSCULAR | Status: DC
  Administered 2020-05-31 – 2020-06-05 (×10): 40 mg via INTRAVENOUS
  Filled 2020-05-31 (×10): qty 1

## 2020-05-31 MED ORDER — SODIUM CHLORIDE 0.9 % IV SOLN: 200.0000 mg | Freq: Once | INTRAVENOUS | Status: DC

## 2020-05-31 MED ORDER — SODIUM CHLORIDE 0.9 % IV SOLN
100.0000 mg | INTRAVENOUS | Status: AC
  Administered 2020-05-31 (×2): 100 mg via INTRAVENOUS
  Filled 2020-05-31 (×3): qty 20

## 2020-05-31 MED ORDER — ONDANSETRON HCL 4 MG/2ML IJ SOLN: 4.0000 mg | Freq: Four times a day (QID) | INTRAMUSCULAR | Status: DC | PRN

## 2020-05-31 MED ORDER — ACETAMINOPHEN 650 MG RE SUPP
650.0000 mg | Freq: Four times a day (QID) | RECTAL | Status: DC | PRN
  Administered 2020-06-15: 650 mg via RECTAL
  Filled 2020-05-31: qty 1

## 2020-05-31 MED ORDER — ACETAMINOPHEN 325 MG PO TABS
650.0000 mg | ORAL_TABLET | Freq: Four times a day (QID) | ORAL | Status: DC | PRN
  Administered 2020-06-12 – 2020-06-16 (×5): 650 mg via ORAL
  Filled 2020-05-31 (×5): qty 2

## 2020-05-31 MED ORDER — DEXAMETHASONE SODIUM PHOSPHATE 10 MG/ML IJ SOLN
10.0000 mg | Freq: Once | INTRAMUSCULAR | Status: AC
  Administered 2020-05-31: 10 mg via INTRAVENOUS
  Filled 2020-05-31: qty 1

## 2020-05-31 MED ORDER — ACETAMINOPHEN 500 MG PO TABS
1000.0000 mg | ORAL_TABLET | Freq: Once | ORAL | Status: AC
  Administered 2020-05-31: 1000 mg via ORAL
  Filled 2020-05-31: qty 2

## 2020-05-31 MED ORDER — ENOXAPARIN SODIUM 40 MG/0.4ML ~~LOC~~ SOLN
40.0000 mg | SUBCUTANEOUS | Status: DC
  Administered 2020-05-31 – 2020-06-01 (×2): 40 mg via SUBCUTANEOUS
  Filled 2020-05-31 (×2): qty 0.4

## 2020-05-31 MED ORDER — SODIUM CHLORIDE 0.9 % IV SOLN
100.0000 mg | Freq: Every day | INTRAVENOUS | Status: AC
  Administered 2020-06-01 – 2020-06-04 (×4): 100 mg via INTRAVENOUS
  Filled 2020-05-31 (×4): qty 20

## 2020-05-31 MED ORDER — GUAIFENESIN-DM 100-10 MG/5ML PO SYRP: 10.0000 mL | ORAL_SOLUTION | ORAL | Status: DC | PRN

## 2020-05-31 MED ORDER — IOHEXOL 350 MG/ML SOLN
75.0000 mL | Freq: Once | INTRAVENOUS | Status: AC | PRN
  Administered 2020-05-31: 75 mL via INTRAVENOUS

## 2020-05-31 MED ORDER — POTASSIUM CHLORIDE CRYS ER 20 MEQ PO TBCR
40.0000 meq | EXTENDED_RELEASE_TABLET | Freq: Once | ORAL | Status: AC
  Administered 2020-05-31: 40 meq via ORAL
  Filled 2020-05-31: qty 2

## 2020-05-31 MED ORDER — SODIUM CHLORIDE 0.9 % IV SOLN: Freq: Once | INTRAVENOUS | Status: AC

## 2020-05-31 MED ORDER — SODIUM CHLORIDE 0.9 % IV SOLN: 100.0000 mg | Freq: Every day | INTRAVENOUS | Status: DC

## 2020-05-31 MED ORDER — POTASSIUM CHLORIDE 20 MEQ PO PACK
40.0000 meq | PACK | Freq: Once | ORAL | Status: AC
  Administered 2020-05-31: 40 meq via ORAL
  Filled 2020-05-31 (×2): qty 2

## 2020-05-31 MED ORDER — IPRATROPIUM-ALBUTEROL 20-100 MCG/ACT IN AERS
1.0000 | INHALATION_SPRAY | Freq: Four times a day (QID) | RESPIRATORY_TRACT | Status: DC
  Administered 2020-05-31 – 2020-06-03 (×10): 1 via RESPIRATORY_TRACT
  Filled 2020-05-31 (×2): qty 4

## 2020-05-31 MED ORDER — ONDANSETRON HCL 4 MG PO TABS: 4.0000 mg | ORAL_TABLET | Freq: Four times a day (QID) | ORAL | Status: DC | PRN

## 2020-05-31 MED ORDER — ALBUTEROL SULFATE HFA 108 (90 BASE) MCG/ACT IN AERS
1.0000 | INHALATION_SPRAY | RESPIRATORY_TRACT | Status: DC | PRN
  Filled 2020-05-31: qty 6.7

## 2020-05-31 MED ORDER — HYDROCOD POLST-CPM POLST ER 10-8 MG/5ML PO SUER
5.0000 mL | Freq: Two times a day (BID) | ORAL | Status: DC
Start: 2020-05-31 — End: 2020-06-17
  Administered 2020-05-31 – 2020-06-16 (×29): 5 mL via ORAL
  Filled 2020-05-31 (×29): qty 5

## 2020-05-31 NOTE — ED Provider Notes (Addendum)
[facility] EMERGENCY DEPARTMENT Provider Note   CSN: [id number] Arrival date & time: 05/31/20  1232     History Chief Complaint  Patient presents with  . Shortness of Breath    Rachel [name] is a 65 y.o. female.  HPI   65 year old female with past medical history of GERD, kidney stones, HLD presents the emergency department with shortness of breath.  Patient has been complaining of flulike symptoms for the past 2 weeks, she states that shortness of breath started getting worse last night, she was originally seen in urgent care and sent here for hypoxia.  She endorses congestion, sore throat, headache, myalgias, fever.  No known sick contacts.  No active chest pain.    Past Medical History:  Diagnosis Date  . GERD (gastroesophageal reflux disease)   . Hypercholesteremia   . Kidney stones   . Wears dentures    full upper, partial lower    Patient Active Problem List   Diagnosis Date Noted  . Special screening for malignant neoplasms, colon     Past Surgical History:  Procedure Laterality Date  . ABDOMINAL HYSTERECTOMY    . BREAST BIOPSY Right   . BREAST EXCISIONAL BIOPSY Right   . COLONOSCOPY    . COLONOSCOPY WITH PROPOFOL N/A 04/22/2015   Procedure: COLONOSCOPY WITH PROPOFOL;  Surgeon: Midge Minium, MD;  Location: [facility];  Service: Endoscopy;  Laterality: N/A;  . KIDNEY STONE SURGERY       OB History   No obstetric history on file.     Family History  Problem Relation Age of Onset  . Breast cancer Paternal Aunt        in 16's    Social History   Tobacco Use  . Smoking status: Former Smoker    Types: Cigarettes  . Tobacco comment: quit 2005  Substance Use Topics  . Alcohol use: No    Home Medications Prior to Admission medications   Medication Sig Start Date End Date Taking? Authorizing Provider  Ca Carbonate-Mag Hydroxide (ROLAIDS PO) Take by mouth.    [provider]  Boris Lown Oil 1000 MG CAPS Take by mouth.    [provider]    Allergies    Patient has no known allergies.  Review of Systems   Review of Systems  Constitutional: Positive for appetite change, chills, fatigue and fever.  HENT: Positive for sore throat. Negative for congestion.   Eyes: Negative for visual disturbance.  Respiratory: Positive for cough and shortness of breath.   Cardiovascular: Negative for chest pain, palpitations and leg swelling.  Gastrointestinal: Positive for nausea. Negative for abdominal pain, diarrhea and vomiting.  Genitourinary: Negative for dysuria.  Musculoskeletal: Positive for myalgias.  Skin: Negative for rash.  Neurological: Positive for headaches.    Physical Exam Updated Vital Signs BP (!) 100/52   Pulse 89   Temp (!) 101.2 F (38.4 C) (Oral)   Resp (!) 31   Ht 5\' 4"  (1.626 m)   Wt 72.6 kg   SpO2 96%   BMI 27.46 kg/m   Physical Exam Vitals and nursing note reviewed.  Constitutional:      Appearance: Normal appearance. She is ill-appearing.  HENT:     Head: Normocephalic.     Mouth/Throat:     Mouth: Mucous membranes are moist.  Cardiovascular:     Rate and Rhythm: Normal rate.  Pulmonary:     Effort: Pulmonary effort is normal. No tachypnea.     Breath sounds: Examination of the  right-lower field reveals rales. Examination of the left-lower field reveals rales. Rales present.     Comments: On nasal cannula Chest:     Chest wall: No crepitus.  Abdominal:     Palpations: Abdomen is soft.     Tenderness: There is no abdominal tenderness.  Skin:    General: Skin is warm.  Neurological:     Mental Status: She is alert and oriented to person, place, and time. Mental status is at baseline.  Psychiatric:        Mood and Affect: Mood normal.     ED Results / Procedures / Treatments   Labs (all labs ordered are listed, but only abnormal results are displayed) Labs Reviewed  CBC WITH DIFFERENTIAL/PLATELET - Abnormal; Notable for the following components:      Result Value    Hemoglobin 11.9 (*)    All other components within normal limits  COMPREHENSIVE METABOLIC PANEL - Abnormal; Notable for the following components:   Sodium 131 (*)    Potassium 3.0 (*)    Glucose, Bld 106 (*)    All other components within normal limits  POC SARS CORONAVIRUS 2 AG -  ED - Abnormal; Notable for the following components:   SARS Coronavirus 2 Ag POSITIVE (*)    All other components within normal limits  BRAIN NATRIURETIC PEPTIDE  TROPONIN I (HIGH SENSITIVITY)  TROPONIN I (HIGH SENSITIVITY)    EKG EKG Interpretation  Date/Time:  Friday May 31 2020 12:46:19 EST Ventricular Rate:  93 PR Interval:    QRS Duration: 130 QT Interval:  353 QTC Calculation: 439 R Axis:   48 Text Interpretation: Sinus rhythm Right bundle branch block RBBB which appears new Confirmed by Coralee Pesa [phone]) on 05/31/2020 1:02:52 PM   Radiology DG Chest Port 1 View  Result Date: 05/31/2020 CLINICAL DATA:  Shortness of breath. EXAM: PORTABLE CHEST 1 VIEW COMPARISON:  March 25, 2009. FINDINGS: The heart size and mediastinal contours are within normal limits. No pneumothorax or pleural effusion is noted. Patchy airspace opacities are noted throughout both lungs consistent with multifocal pneumonia. The visualized skeletal structures are unremarkable. IMPRESSION: Bilateral multifocal pneumonia. Electronically Signed   By: Lupita Raider M.D.   On: 05/31/2020 13:24    Procedures .Critical Care Performed by: Rozelle Logan, DO Authorized by: Rozelle Logan, DO   Critical care provider statement:    Critical care time (minutes):  45   Critical care was time spent personally by me on the following activities:  Discussions with consultants, evaluation of patient's response to treatment, examination of patient, ordering and performing treatments and interventions, ordering and review of laboratory studies, ordering and review of radiographic studies, pulse oximetry, re-evaluation of  patient's condition, obtaining history from patient or surrogate and review of old charts   I assumed direction of critical care for this patient from another provider in my specialty: no       Medications Ordered in ED Medications  acetaminophen (TYLENOL) tablet 1,000 mg (1,000 mg Oral Given 05/31/20 1326)  iohexol (OMNIPAQUE) 350 MG/ML injection 75 mL (75 mLs Intravenous Contrast Given 05/31/20 1416)    ED Course  I have reviewed the triage vital signs and the nursing notes.  Pertinent labs & imaging results that were available during my care of the patient were reviewed by me and considered in my medical decision making (see chart for details).    MDM Rules/Calculators/A&P  65 year old female presents the emergency department with concern for flulike symptoms and hypoxia.  She is noted to be Covid positive, currently requiring 6 L of nasal cannula for hypoxia reported in the low 80s on room air. Patient is unvaccinated.  She is ill-appearing but in no acute distress.  EKG shows a new right bundle branch block, could be concerning for pulmonary embolism in the setting of Covid infection.  Blood work otherwise shows a mild hypokalemia and hyponatremia without other acute abnormalities.  Cardiac work-up is negative.  Chest x-ray shows bilateral multifocal pneumonia, CTA shows no pulmonary embolism.  Patient will be treated symptomatically, continues to require oxygen, will be admitted.  Patients evaluation and results requires admission for further treatment and care. Patient agrees with admission plan, offers no new complaints and is stable/unchanged at time of admit.  Final Clinical Impression(s) / ED Diagnoses Final diagnoses:  None    Rx / DC Orders ED Discharge Orders    None       Rozelle Logan, DO 05/31/20 1541    Jacqui Headen, Clabe Seal, DO 05/31/20 1557

## 2020-05-31 NOTE — ED Triage Notes (Signed)
Pt. Arrived via EMS from urgent care. Per urgent care pts. O2 was 50 on RA. Pt. Complains of flu like symptoms for 2 weeks. Pt. Was 83 on RA. Pt. Is currently on 6 L of oxygen with sats of 92.

## 2020-05-31 NOTE — H&P (Signed)
History and Physical    Rachel [name] VHQ:[id number] DOB: 1955/05/13 DOA: 05/31/2020  PCP: Tanna FurryZhou-Talbert, Serena S, MD   Patient coming from: Home   Chief Complaint: Dyspnea   HPI: Rachel [name] is a 65 y.o. female with medical history significant of dyslipidemia, GERD and kidney stones who presented with dyspnea.  She reports 7 days of an upper respiratory tract infection symptoms including runny nose, headaches, generalized weakness and decreased appetite.  Over the last 48 hours she had developed persistent cough and dyspnea.  She took over-the-counter antitussive agents and ibuprofen with no significant improvement of her symptoms.  There is no improving or worsening factors. Because of progressively worsening symptoms she presented to the local urgent care where her oxygenation saturation was 50% on room air, then she was referred to the hospital for further evaluation..  She has been not vaccinated for COVID-19.  She reports her husband also having flulike symptoms at home.    ED Course: Patient was diagnosed with acute hypoxic respiratory failure, chest radiograph was positive for infiltrates and her Covid test came back positive. Patient was placed on supplemental oxygen, received systemic steroids and intravenous fluids.  Review of Systems:  1. General: No fevers, no chills, no weight gain or weight loss, poor appetite and generalized weakness.  2. ENT: positive runny nose and sore throat, no hearing disturbances 3. Pulmonary: positive dyspnea, cough, but no wheezing, or hemoptysis 4. Cardiovascular: No angina, claudication, lower extremity edema, pnd or orthopnea 5. Gastrointestinal: No nausea or vomiting, no diarrhea or constipation 6. Hematology: No easy bruisability or frequent infections 7. Urology: No dysuria, hematuria or increased urinary frequency 8. Dermatology: No rashes. 9. Neurology: No seizures or paresthesias 10. Musculoskeletal: No joint pain or  deformities  Past Medical History:  Diagnosis Date  . GERD (gastroesophageal reflux disease)   . Hypercholesteremia   . Kidney stones   . Wears dentures    full upper, partial lower    Past Surgical History:  Procedure Laterality Date  . ABDOMINAL HYSTERECTOMY    . BREAST BIOPSY Right   . BREAST EXCISIONAL BIOPSY Right   . COLONOSCOPY    . COLONOSCOPY WITH PROPOFOL N/A 04/22/2015   Procedure: COLONOSCOPY WITH PROPOFOL;  Surgeon: Midge Miniumarren Wohl, MD;  [facility];  Service: Endoscopy;  Laterality: N/A;  . KIDNEY STONE SURGERY       reports that she has quit smoking. Her smoking use included cigarettes. She does not have any smokeless tobacco history on file. She reports that she does not drink alcohol. No history on file for drug use.  No Known Allergies  Family History  Problem Relation Age of Onset  . Breast cancer Paternal Aunt        in 750's     Prior to Admission medications   Medication Sig Start Date End Date Taking? Authorizing Provider  Ca Carbonate-Mag Hydroxide (ROLAIDS PO) Take by mouth.    [provider]  Boris LownKrill Oil 1000 MG CAPS Take by mouth.    [provider]    Physical Exam: Vitals:   05/31/20 1515 05/31/20 1539 05/31/20 1600 05/31/20 1608  BP:  (!) 93/54 101/69   Pulse: 78 84 75   Resp: (!) 26 (!) 23 (!) 26   Temp:    98.7 F (37.1 C)  TempSrc:    Oral  SpO2: 97% 96% 94%   Weight:      Height:        Vitals:   05/31/20 1515 05/31/20  1539 05/31/20 1600 05/31/20 1608  BP:  (!) 93/54 101/69   Pulse: 78 84 75   Resp: (!) 26 (!) 23 (!) 26   Temp:    98.7 F (37.1 C)  TempSrc:    Oral  SpO2: 97% 96% 94%   Weight:      Height:       General: deconditioned and ill looking appearing  Neurology: Awake and alert, non focal Head and Neck. Head normocephalic. Neck supple with no adenopathy or thyromegaly.   E ENT: mild pallor, no icterus, oral mucosa dry.  Cardiovascular: No JVD. S1-S2 present, rhythmic, no  gallops, rubs, or murmurs. No lower extremity edema. Pulmonary: positive breath sounds bilaterally, with no wheezing, but positive bilateral scattered rales. No rhonchi.  Gastrointestinal. Abdomen soft and non tender Skin. No rashes Musculoskeletal: no joint deformities    Labs on Admission: I have personally reviewed following labs and imaging studies  CBC: Recent Labs  Lab 05/31/20 1252  WBC 8.8  NEUTROABS 7.0  HGB 11.9*  HCT 36.7  MCV 88.9  PLT 240   Basic Metabolic Panel: Recent Labs  Lab 05/31/20 1252  NA 131*  K 3.0*  CL 99  CO2 23  GLUCOSE 106*  BUN 12  CREATININE 0.79  CALCIUM 9.0   GFR: Estimated Creatinine Clearance: 70.3 mL/min (by C-G formula based on SCr of 0.79 mg/dL). Liver Function Tests: Recent Labs  Lab 05/31/20 1252  AST 32  ALT 21  ALKPHOS 50  BILITOT 0.9  PROT 7.3  ALBUMIN 3.7   No results for input(s): LIPASE, AMYLASE in the last 168 hours. No results for input(s): AMMONIA in the last 168 hours. Coagulation Profile: No results for input(s): INR, PROTIME in the last 168 hours. Cardiac Enzymes: No results for input(s): CKTOTAL, CKMB, CKMBINDEX, TROPONINI in the last 168 hours. BNP (last 3 results) No results for input(s): PROBNP in the last 8760 hours. HbA1C: No results for input(s): HGBA1C in the last 72 hours. CBG: No results for input(s): GLUCAP in the last 168 hours. Lipid Profile: No results for input(s): CHOL, HDL, LDLCALC, TRIG, CHOLHDL, LDLDIRECT in the last 72 hours. Thyroid Function Tests: No results for input(s): TSH, T4TOTAL, FREET4, T3FREE, THYROIDAB in the last 72 hours. Anemia Panel: No results for input(s): VITAMINB12, FOLATE, FERRITIN, TIBC, IRON, RETICCTPCT in the last 72 hours. Urine analysis: No results found for: COLORURINE, APPEARANCEUR, LABSPEC, PHURINE, GLUCOSEU, HGBUR, BILIRUBINUR, KETONESUR, PROTEINUR, UROBILINOGEN, NITRITE, LEUKOCYTESUR  Radiological Exams on Admission: CT Angio Chest PE W/Cm &/Or Wo  Cm  Result Date: 05/31/2020 CLINICAL DATA:  Shortness of breath.  COVID-19 positive. EXAM: CT ANGIOGRAPHY CHEST WITH CONTRAST TECHNIQUE: Multidetector CT imaging of the chest was performed using the standard protocol during bolus administration of intravenous contrast. Multiplanar CT image reconstructions and MIPs were obtained to evaluate the vascular anatomy. CONTRAST:  17mL OMNIPAQUE IOHEXOL 350 MG/ML SOLN COMPARISON:  None. FINDINGS: Cardiovascular: Satisfactory opacification of the pulmonary arteries to the segmental level. No evidence of pulmonary embolism. Mild cardiomegaly. No pericardial effusion. Atherosclerosis of thoracic aorta is noted without aneurysm formation. Mediastinum/Nodes: No enlarged mediastinal, hilar, or axillary lymph nodes. Thyroid gland, trachea, and esophagus demonstrate no significant findings. Lungs/Pleura: No pneumothorax or pleural effusion is noted. Patchy airspace opacities are noted throughout both lungs consistent with multifocal pneumonia. Upper Abdomen: No acute abnormality. Musculoskeletal: No chest wall abnormality. No acute or significant osseous findings. Review of the MIP images confirms the above findings. IMPRESSION: 1. No definite evidence of pulmonary embolus. 2. Patchy  airspace opacities are noted throughout both lungs consistent with multifocal pneumonia. 3. Aortic atherosclerosis. Aortic Atherosclerosis (ICD10-I70.0). Electronically Signed   By: Lupita Raider M.D.   On: 05/31/2020 14:32   DG Chest Port 1 View  Result Date: 05/31/2020 CLINICAL DATA:  Shortness of breath. EXAM: PORTABLE CHEST 1 VIEW COMPARISON:  March 25, 2009. FINDINGS: The heart size and mediastinal contours are within normal limits. No pneumothorax or pleural effusion is noted. Patchy airspace opacities are noted throughout both lungs consistent with multifocal pneumonia. The visualized skeletal structures are unremarkable. IMPRESSION: Bilateral multifocal pneumonia. Electronically  Signed   By: Lupita Raider M.D.   On: 05/31/2020 13:24    EKG: Independently reviewed.  93 bpm, normal axis, right bundle branch block, sinus rhythm, no ST segment or T wave changes.  Assessment/Plan Principal Problem:   Pneumonia due to COVID-19 virus Active Problems:   Acute respiratory failure with hypoxia (HCC)   GERD (gastroesophageal reflux disease)   Nephrolithiasis   Hypokalemia   65 year old female unvaccinated for COVID-19 who presents with 7 days of upper respiratory tract infection symptoms that has progressed into dyspnea and cough for the last 48 hours.  Poor appetite and generalized weakness. On her initial physical examination her oximetry was 83% on room air, placed on 6 L per nasal cannula reaching 92%, temperature 38.4 C, blood pressure 130/66, 83/68, respiratory rate 28-31, oxygen saturation 95% on 9 L/min per high flow nasal cannula.  She has dry mucous membranes, lungs with rales bilaterally but no wheezing, heart S1-S2, present rhythmic, soft abdomen no extremity edema.  Sodium 131, potassium 3.0, chloride 99, bicarb 23, glucose 106, BUN 12, creatinine 0.79, troponin I 15-18, white count 8.8, hemoglobin 0.9, hematocrit 36.7, platelets 240. SARS COVID-19 positive. Chest radiograph with multilobar interstitial infiltrates, all 4 quadrants, predominantly lower lobes, more right than left.   CT chest with no evidence of pulmonary embolism, bilateral multilobar dense groundglass opacities.  Rachel [name] will be admitted to the hospital with a working diagnosis of acute hypoxic respiratory failure due to SARS COVID-19 viral pneumonia.   1.  Acute hypoxic respiratory failure due to SARS COVID-19 viral pneumonia.  Patient will be admitted to the telemetry ward, continue supplemental oxygen per high flow nasal cannula, to target oxygen saturation greater than 94%.  Check inflammatory markers including CRP, ferritin and D-dimer.  Medical therapy with systemic  corticosteroids, dexamethasone 50 mg twice daily intravenously and antiviral therapy with remdesivir. Bronchodilator therapy, antitussive agents and airway clearing techniques with incentive spirometer and flutter valve.  Continue close follow-up of inflammatory markers and oxygenation.  2.  Hyponatremia/hypokalemia due to dehydration.  Patient has received intravenous fluids in the emergency department, along with 40 mEq potassium chloride. Add extra 40 mEq to make a total of 80 mEq, check magnesium in a.m. Follow a restrictive intravenous fluids strategy in setting of acute viral pneumonia.  3.  GERD.  Continue antiacid therapy with pantoprazole.  4.  History of nephrolithiasis.  No acute exacerbation.   Status is: Inpatient  Remains inpatient appropriate because:IV treatments appropriate due to intensity of illness or inability to take PO   Dispo: The patient is from: Home              Anticipated d/c is to: Home              Anticipated d/c date is: 3 days              Patient currently is not medically  stable to d/c.   Difficult to place patient No   DVT prophylaxis: Enoxaparin   Code Status:   full  Family Communication:  Patient ask me not to call her husband today.     Consults called:  none  Admission status:   Inpatient.    Masami Plata Annett Gula MD [facility]   05/31/2020, 4:17 PM

## 2020-05-31 NOTE — ED Notes (Signed)
Attempted report x 2 

## 2020-05-31 NOTE — ED Notes (Signed)
Pts. Monitor was showing O2 sats of 82. Entered pts. Room and pt. Was sleeping. Asked pt. To turn to their side. Assessed their nasal cannula to make sure it was placed right. Nasal cannula was not in pts. Nose. Placed the nasal cannula back. Pts. O2 went back to 88. Pt. Is now 90 on 12 L high flow nasal cannula. Pt. Is resting.

## 2020-05-31 NOTE — ED Notes (Signed)
ED TO INPATIENT HANDOFF REPORT  ED Nurse Name and Phone #:   S Name/Age/Gender Rachel [name] 65 y.o. female Room/Bed: APA04/APA04  Code Status   Code Status: Not on file  Home/SNF/Other Home Patient oriented to: self, place, time and situation Is this baseline? Yes   Triage Complete: Triage complete  Chief Complaint Pneumonia due to COVID-19 virus [U07.1, J12.82]  Triage Note Pt. Arrived via EMS from urgent care. Per urgent care pts. O2 was 50 on RA. Pt. Complains of flu like symptoms for 2 weeks. Pt. Was 83 on RA. Pt. Is currently on 6 L of oxygen with sats of 92.    Allergies No Known Allergies  Level of Care/Admitting Diagnosis ED Disposition    ED Disposition Condition Comment   Admit  Hospital Area: [facility] [[id number]]  Level of Care: Telemetry [5]  Covid Evaluation: Confirmed COVID Positive  Diagnosis: Pneumonia due to COVID-19 virus [[id number]]  Admitting Physician: Coralie Keens [[id number]]  Attending Physician: Coralie Keens [[id number]]  Estimated length of stay: 3 - 4 days  Certification:: I certify this patient will need inpatient services for at least 2 midnights       B Medical/Surgery History Past Medical History:  Diagnosis Date  . GERD (gastroesophageal reflux disease)   . Hypercholesteremia   . Kidney stones   . Wears dentures    full upper, partial lower   Past Surgical History:  Procedure Laterality Date  . ABDOMINAL HYSTERECTOMY    . BREAST BIOPSY Right   . BREAST EXCISIONAL BIOPSY Right   . COLONOSCOPY    . COLONOSCOPY WITH PROPOFOL N/A 04/22/2015   Procedure: COLONOSCOPY WITH PROPOFOL;  Surgeon: Midge Minium, MD;  Location: [facility];  Service: Endoscopy;  Laterality: N/A;  . KIDNEY STONE SURGERY       A IV Location/Drains/Wounds Patient Lines/Drains/Airways Status    Active Line/Drains/Airways    Name Placement date Placement time Site Days   Peripheral IV 05/31/20 Left Forearm 05/31/20   1255  Forearm  less than 1   Incision (Closed) 04/22/15 Rectum Other (Comment) 04/22/15  1220  -- 1866          Intake/Output Last 24 hours  Intake/Output Summary (Last 24 hours) at 05/31/2020 2033 Last data filed at 05/31/2020 1851 Gross per 24 hour  Intake 200 ml  Output --  Net 200 ml    Labs/Imaging Results for orders placed or performed during the hospital encounter of 05/31/20 (from the past 48 hour(s))  POC SARS Coronavirus 2 Ag-ED -     Status: Abnormal   Collection Time: 05/31/20 12:48 PM  Result Value Ref Range   SARS Coronavirus 2 Ag POSITIVE (A) NEGATIVE    Comment: (NOTE) SARS-CoV-2 antigen PRESENT.  Positive results indicate the presence of viral antigens, but clinical correlation with patient history and other diagnostic information is necessary to determine patient infection status.  Positive results do not rule out bacterial infection or co-infection  with other viruses. False positive results are rare but can occur, and confirmatory RT-PCR testing may be appropriate in some circumstances. The expected result is Negative.  Fact Sheet for Patients: https://www.jennings-kim.com/  Fact Sheet for Healthcare Providers: https://alexander-rogers.biz/  This test is not yet approved or cleared by the [place] FDA and  has been authorized for detection and/or diagnosis of SARS-CoV-2 by FDA under an Emergency Use Authorization (EUA).  This EUA will remain in effect (meaning this test can be used) for the duration of  the  COVID-19 declaration under Section 564(b)(1) of the Act, 21 U.S.C. section [id number]( b)(1), unless the authorization is terminated or revoked sooner.    CBC with Differential     Status: Abnormal   Collection Time: 05/31/20 12:52 PM  Result Value Ref Range   WBC 8.8 4.0 - 10.5 K/uL   RBC 4.13 3.87 - 5.11 MIL/uL   Hemoglobin 11.9 (L) 12.0 - 15.0 g/dL   HCT 41.336.7 24.436.0 - 01.046.0 %   MCV 88.9 80.0 - 100.0 fL   MCH 28.8  26.0 - 34.0 pg   MCHC 32.4 30.0 - 36.0 g/dL   RDW 27.213.4 53.611.5 - 64.415.5 %   Platelets 240 150 - 400 K/uL   nRBC 0.0 0.0 - 0.2 %   Neutrophils Relative % 78 %   Neutro Abs 7.0 1.7 - 7.7 K/uL   Lymphocytes Relative 11 %   Lymphs Abs 0.9 0.7 - 4.0 K/uL   Monocytes Relative 9 %   Monocytes Absolute 0.8 0.1 - 1.0 K/uL   Eosinophils Relative 1 %   Eosinophils Absolute 0.1 0.0 - 0.5 K/uL   Basophils Relative 0 %   Basophils Absolute 0.0 0.0 - 0.1 K/uL   Immature Granulocytes 1 %   Abs Immature Granulocytes 0.05 0.00 - 0.07 K/uL    Comment: Performed at [facility], [address] [place], [place] [address]  Comprehensive metabolic panel     Status: Abnormal   Collection Time: 05/31/20 12:52 PM  Result Value Ref Range   Sodium 131 (L) 135 - 145 mmol/L   Potassium 3.0 (L) 3.5 - 5.1 mmol/L   Chloride 99 98 - 111 mmol/L   CO2 23 22 - 32 mmol/L   Glucose, Bld 106 (H) 70 - 99 mg/dL    Comment: Glucose reference range applies only to samples taken after fasting for at least 8 hours.   BUN 12 8 - 23 mg/dL   Creatinine, Ser 2.590.79 0.44 - 1.00 mg/dL   Calcium 9.0 8.9 - 56.310.3 mg/dL   Total Protein 7.3 6.5 - 8.1 g/dL   Albumin 3.7 3.5 - 5.0 g/dL   AST 32 15 - 41 U/L   ALT 21 0 - 44 U/L   Alkaline Phosphatase 50 38 - 126 U/L   Total Bilirubin 0.9 0.3 - 1.2 mg/dL   GFR, Estimated >87>60 >56>60 mL/min    Comment: (NOTE) Calculated using the CKD-EPI Creatinine Equation (2021)    Anion gap 9 5 - 15    Comment: Performed at [facility], [address] [place], [place] [address]  Brain natriuretic peptide     Status: None   Collection Time: 05/31/20 12:52 PM  Result Value Ref Range   B Natriuretic Peptide 76.0 0.0 - 100.0 pg/mL    Comment: Performed at [facility], [address] [place], [place] [address]  Troponin I (High Sensitivity)     Status: None   Collection Time: 05/31/20 12:52 PM  Result Value Ref Range   Troponin I (High Sensitivity) 15 <18 ng/L    Comment: (NOTE) Elevated high sensitivity  troponin I (hsTnI) values and significant  changes across serial measurements may suggest ACS but many other  chronic and acute conditions are known to elevate hsTnI results.  Refer to the "Links" section for chest pain algorithms and additional  guidance. Performed at [facility], [address] [place], [place] [address]   Troponin I (High Sensitivity)     Status: Abnormal   Collection Time: 05/31/20  3:05 PM  Result Value Ref Range   Troponin I (High Sensitivity) 18 (H) <18 ng/L    Comment: (NOTE) Elevated high sensitivity troponin I (hsTnI) values and significant  changes across serial measurements may suggest ACS but many other  chronic and acute conditions are known to elevate hsTnI results.  Refer to the "Links" section for chest pain algorithms and additional  guidance. Performed at [facility], [address] [place], [place] [address]   C-reactive protein     Status: Abnormal   Collection Time: 05/31/20  5:03 PM  Result Value Ref Range   CRP 19.1 (H) <1.0 mg/dL    Comment: Performed at [facility], [address] [place], [place] [address]  D-dimer, quantitative     Status: Abnormal   Collection Time: 05/31/20  5:03 PM  Result Value Ref Range   D-Dimer, Quant 1.44 (H) 0.00 - 0.50 ug/mL-FEU    Comment: (NOTE) At the manufacturer cut-off value of 0.5 g/mL FEU, this assay has a negative predictive value of 95-100%.This assay is intended for use in conjunction with a clinical pretest probability (PTP) assessment model to exclude pulmonary embolism (PE) and deep venous thrombosis (DVT) in outpatients suspected of PE or DVT. Results should be correlated with clinical presentation. Performed at [facility], [address] [place], [place] [address]   Ferritin     Status: Abnormal   Collection Time: 05/31/20  5:03 PM  Result Value Ref Range   Ferritin 734 (H) 11 - 307 ng/mL    Comment: Performed at [facility], [address], [place], [place] [address]   CT Angio  Chest PE W/Cm &/Or Wo Cm  Result Date: 05/31/2020 CLINICAL DATA:  Shortness of breath.  COVID-19 positive. EXAM: CT ANGIOGRAPHY CHEST WITH CONTRAST TECHNIQUE: Multidetector CT imaging of the chest was performed using the standard protocol during bolus administration of intravenous contrast. Multiplanar CT image reconstructions and MIPs were obtained to evaluate the vascular anatomy. CONTRAST:  46mL OMNIPAQUE IOHEXOL 350 MG/ML SOLN COMPARISON:  None. FINDINGS: Cardiovascular: Satisfactory opacification of the pulmonary arteries to the segmental level. No evidence of pulmonary embolism. Mild cardiomegaly. No pericardial effusion. Atherosclerosis of thoracic aorta is noted without aneurysm formation. Mediastinum/Nodes: No enlarged mediastinal, hilar, or axillary lymph nodes. Thyroid gland, trachea, and esophagus demonstrate no significant findings. Lungs/Pleura: No pneumothorax or pleural effusion is noted. Patchy airspace opacities are noted throughout both lungs consistent with multifocal pneumonia. Upper Abdomen: No acute abnormality. Musculoskeletal: No chest wall abnormality. No acute or significant osseous findings. Review of the MIP images confirms the above findings. IMPRESSION: 1. No definite evidence of pulmonary embolus. 2. Patchy airspace opacities are noted throughout both lungs consistent with multifocal pneumonia. 3. Aortic atherosclerosis. Aortic Atherosclerosis (ICD10-I70.0). Electronically Signed   By: Lupita Raider M.D.   On: 05/31/2020 14:32   DG Chest Port 1 View  Result Date: 05/31/2020 CLINICAL DATA:  Shortness of breath. EXAM: PORTABLE CHEST 1 VIEW COMPARISON:  March 25, 2009. FINDINGS: The heart size and mediastinal contours are within normal limits. No pneumothorax or pleural effusion is noted. Patchy airspace opacities are noted throughout both lungs consistent with multifocal pneumonia. The visualized skeletal structures are unremarkable. IMPRESSION: Bilateral multifocal  pneumonia. Electronically Signed   By: Lupita Raider M.D.   On: 05/31/2020 13:24    Pending Labs [employer] (From admission, onward)          Start     Ordered   Signed and Held  HIV Antibody (routine testing w rflx)  (  HIV Antibody (Routine testing w reflex) panel)  Once,   R        Signed and Held   Signed and Held  CBC  (enoxaparin (LOVENOX)    CrCl >/= 30 ml/min)  Once,   R       Comments: Baseline for enoxaparin therapy IF NOT ALREADY DRAWN.  Notify MD if PLT < 100 K.    Signed and Held   Signed and Held  Creatinine, serum  (enoxaparin (LOVENOX)    CrCl >/= 30 ml/min)  Once,   R       Comments: Baseline for enoxaparin therapy IF NOT ALREADY DRAWN.    Signed and Held   Signed and Held  Creatinine, serum  (enoxaparin (LOVENOX)    CrCl >/= 30 ml/min)  Weekly,   R     Comments: while on enoxaparin therapy    Signed and Held   Signed and Held  Comprehensive metabolic panel  Daily,   R      Signed and Held   Signed and Held  C-reactive protein  Daily,   R      Signed and Held   Signed and Held  D-dimer, quantitative  Daily,   R      Signed and Held   Signed and Held  Ferritin  Daily,   R      Signed and Held   Signed and Held  Magnesium  Tomorrow morning,   R        Signed and Held          Vitals/Pain Today's Vitals   05/31/20 1608 05/31/20 1630 05/31/20 1700 05/31/20 1730  BP:  111/69 101/66 103/68  Pulse:  74 84 73  Resp:  (!) 26 (!) 28 (!) 29  Temp: 98.7 F (37.1 C)     TempSrc: Oral     SpO2:  97% 91% 94%  Weight:      Height:      PainSc:        Isolation Precautions No active isolations  Medications Medications  remdesivir 100 mg in sodium chloride 0.9 % 100 mL IVPB (has no administration in time range)  acetaminophen (TYLENOL) tablet 1,000 mg (1,000 mg Oral Given 05/31/20 1326)  iohexol (OMNIPAQUE) 350 MG/ML injection 75 mL (75 mLs Intravenous Contrast Given 05/31/20 1416)  0.9 %  sodium chloride infusion ( Intravenous New Bag/Given 05/31/20 1620)   dexamethasone (DECADRON) injection 10 mg (10 mg Intravenous Given 05/31/20 1609)  potassium chloride (KLOR-CON) packet 40 mEq (40 mEq Oral Given 05/31/20 1627)  remdesivir 100 mg in sodium chloride 0.9 % 100 mL IVPB (0 mg Intravenous Stopped 05/31/20 1851)    Mobility walks Low fall risk   Focused Assessments    R Recommendations: See Admitting Provider Note  Report given to:   Additional Notes:

## 2020-05-31 NOTE — ED Notes (Signed)
Attempted report x1. 

## 2020-06-01 DIAGNOSIS — K219 Gastro-esophageal reflux disease without esophagitis: Secondary | ICD-10-CM | POA: Diagnosis not present

## 2020-06-01 DIAGNOSIS — J1282 Pneumonia due to coronavirus disease 2019: Secondary | ICD-10-CM | POA: Diagnosis not present

## 2020-06-01 DIAGNOSIS — U071 COVID-19: Secondary | ICD-10-CM | POA: Diagnosis not present

## 2020-06-01 DIAGNOSIS — J9601 Acute respiratory failure with hypoxia: Secondary | ICD-10-CM | POA: Diagnosis not present

## 2020-06-01 LAB — COMPREHENSIVE METABOLIC PANEL
ALT: 17 U/L (ref 0–44)
AST: 28 U/L (ref 15–41)
Albumin: 3.1 g/dL — ABNORMAL LOW (ref 3.5–5.0)
Alkaline Phosphatase: 46 U/L (ref 38–126)
Anion gap: 10 (ref 5–15)
BUN: 13 mg/dL (ref 8–23)
CO2: 24 mmol/L (ref 22–32)
Calcium: 8.9 mg/dL (ref 8.9–10.3)
Chloride: 105 mmol/L (ref 98–111)
Creatinine, Ser: 0.67 mg/dL (ref 0.44–1.00)
GFR, Estimated: >60 mL/min (ref >60)
Glucose, Bld: 153 mg/dL — ABNORMAL HIGH (ref 70–99)
Potassium: 5.1 mmol/L (ref 3.5–5.1)
Sodium: 139 mmol/L (ref 135–145)
Total Bilirubin: 0.7 mg/dL (ref 0.3–1.2)
Total Protein: 6.6 g/dL (ref 6.5–8.1)

## 2020-06-01 LAB — C-REACTIVE PROTEIN: CRP: 21.7 mg/dL — ABNORMAL HIGH (ref <1.0)

## 2020-06-01 LAB — FERRITIN: Ferritin: 1032 ng/mL — ABNORMAL HIGH (ref 11–307)

## 2020-06-01 LAB — HIV ANTIBODY (ROUTINE TESTING W REFLEX): HIV Screen 4th Generation wRfx: NONREACTIVE

## 2020-06-01 LAB — MAGNESIUM: Magnesium: 2.2 mg/dL (ref 1.7–2.4)

## 2020-06-01 LAB — D-DIMER, QUANTITATIVE: D-Dimer, Quant: 1.79 ug/mL-FEU — ABNORMAL HIGH (ref 0.00–0.50)

## 2020-06-01 MED ORDER — TOCILIZUMAB 400 MG/20ML IV SOLN
8.0000 mg/kg | Freq: Once | INTRAVENOUS | Status: AC
  Administered 2020-06-01: 580 mg via INTRAVENOUS
  Filled 2020-06-01: qty 29

## 2020-06-01 NOTE — Progress Notes (Signed)
Patient states she feels better. Saturation 97 on NRB 15 and 15 HFNC. Gave mdi did note slight wheezes left on inspiration. Did flutter and incentive 1200 cc, no coughing with either. Patient reported she slept on her stomach today and plans to do so tonight. Saturation dropped to 90 when doing flutter and incentive.

## 2020-06-01 NOTE — Progress Notes (Signed)
Patient ambulated to bathroom this morning on 11 liters HFNC. Patient desated to 43 to 10 when ambulated back to bed paged MD to make aware . This nurse turned patients oxygen up to 12. Patients oxygen then desated into the 70's. This nurse called respiratoy. Respiratory advised to place non rebreather on the patient and turned HFNC to 14 liters. This nurse paged MD to make aware of non rebreather and oxygen liters. Patient oxygen saturation is 88% on non rebreather 15 liters and 14 liters HFNC. Oxygen saturation is 90%

## 2020-06-01 NOTE — Progress Notes (Signed)
PROGRESS NOTE    Rachel [name]  ZOX:[id number] DOB: 05/29/1955 DOA: 05/31/2020 PCP: Tanna Furry, MD    Brief Narrative:  65 year old female with a history of GERD, hyperlipidemia, presents to the hospital with shortness of breath.  She tested positive for COVID-19 and was found to be significantly hypoxic.  She was admitted for remdesivir, steroids.  Inflammatory markers were elevated and she had increasing oxygen requirements.  She also received a dose of Actemra.   Assessment & Plan:   Principal Problem:   Pneumonia due to COVID-19 virus Active Problems:   Acute respiratory failure with hypoxia (HCC)   GERD (gastroesophageal reflux disease)   Nephrolithiasis   Hypokalemia   Hyponatremia   Acute respiratory failure with hypoxia -Secondary to COVID-19 pneumonia -Currently on 15 L high flow nasal cannula and nonrebreather -Wean down oxygen as tolerated  COVID-19 pneumonia -She has elevated inflammatory markers which are trending up -She is currently on remdesivir, IV steroids -With increasing oxygen requirements and rising inflammatory markers, she will receive 1 dose of Actemra -She does not have any contraindications for Actemra.  Risks and benefits were explained and patient is agreeable -Patient encouraged to prone as much as possible   Hyponatremia/hypokalemia -Related to hypovolemia -Improved with replacement  GERD -Continue on Protonix   DVT prophylaxis: enoxaparin (LOVENOX) injection 40 mg Start: 05/31/20 2200 SCDs Start: 05/31/20 2057  Code Status: *Full code Family Communication: Updated husband over the phone Disposition Plan: Status is: Inpatient  Remains inpatient appropriate because:Inpatient level of care appropriate due to severity of illness   Dispo: The patient is from: Home              Anticipated d/c is to: Home              Anticipated d/c date is: > 3 days              Patient currently is not medically stable to d/c.    Difficult to place patient No    Consultants:     Procedures:     Antimicrobials:       Subjective: Patient became short of breath earlier today when she was ambulating to the bathroom.  Upon returning, she does report improvement in her breathing when she rests.  She is still short of breath and coughing.  She is a has had increasing oxygen requirements and is currently on 15 L plus nonrebreather since her ambulation this morning  Objective: Vitals:   06/01/20 0642 06/01/20 0824 06/01/20 1211 06/01/20 1359  BP: 108/90   118/79  Pulse: 83   69  Resp: 20     Temp: 99 F (37.2 C)   98 F (36.7 C)  TempSrc: Oral   Oral  SpO2: 90% 90% 90% 96%  Weight:      Height:        Intake/Output Summary (Last 24 hours) at 06/01/2020 1403 Last data filed at 05/31/2020 2212 Gross per 24 hour  Intake 1200 ml  Output --  Net 1200 ml   Filed Weights   05/31/20 1244  Weight: 72.6 kg    Examination:  General exam: Appears calm and comfortable  Respiratory system: Clear to auscultation. Respiratory effort normal. Cardiovascular system: S1 & S2 heard, RRR. No JVD, murmurs, rubs, gallops or clicks. No pedal edema. Gastrointestinal system: Abdomen is nondistended, soft and nontender. No organomegaly or masses felt. Normal bowel sounds heard. Central nervous system: Alert and oriented. No focal neurological deficits. Extremities: Symmetric 5 x  5 power. Skin: No rashes, lesions or ulcers Psychiatry: Judgement and insight appear normal. Mood & affect appropriate.     Data Reviewed: I have personally reviewed following labs and imaging studies  CBC: Recent Labs  Lab 05/31/20 1252  WBC 8.8  NEUTROABS 7.0  HGB 11.9*  HCT 36.7  MCV 88.9  PLT 240   Basic Metabolic Panel: Recent Labs  Lab 05/31/20 1252 06/01/20 0602  NA 131* 139  K 3.0* 5.1  CL 99 105  CO2 23 24  GLUCOSE 106* 153*  BUN 12 13  CREATININE 0.79 0.67  CALCIUM 9.0 8.9  MG  --  2.2   GFR: Estimated  Creatinine Clearance: 70.3 mL/min (by C-G formula based on SCr of 0.67 mg/dL). Liver Function Tests: Recent Labs  Lab 05/31/20 1252 06/01/20 0602  AST 32 28  ALT 21 17  ALKPHOS 50 46  BILITOT 0.9 0.7  PROT 7.3 6.6  ALBUMIN 3.7 3.1*   No results for input(s): LIPASE, AMYLASE in the last 168 hours. No results for input(s): AMMONIA in the last 168 hours. Coagulation Profile: No results for input(s): INR, PROTIME in the last 168 hours. Cardiac Enzymes: No results for input(s): CKTOTAL, CKMB, CKMBINDEX, TROPONINI in the last 168 hours. BNP (last 3 results) No results for input(s): PROBNP in the last 8760 hours. HbA1C: No results for input(s): HGBA1C in the last 72 hours. CBG: No results for input(s): GLUCAP in the last 168 hours. Lipid Profile: No results for input(s): CHOL, HDL, LDLCALC, TRIG, CHOLHDL, LDLDIRECT in the last 72 hours. Thyroid Function Tests: No results for input(s): TSH, T4TOTAL, FREET4, T3FREE, THYROIDAB in the last 72 hours. Anemia Panel: Recent Labs    05/31/20 1703 06/01/20 0602  FERRITIN 734* 1,032*   Sepsis Labs: No results for input(s): PROCALCITON, LATICACIDVEN in the last 168 hours.  No results found for this or any previous visit (from the past 240 hour(s)).       Radiology Studies: CT Angio Chest PE W/Cm &/Or Wo Cm  Result Date: 05/31/2020 CLINICAL DATA:  Shortness of breath.  COVID-19 positive. EXAM: CT ANGIOGRAPHY CHEST WITH CONTRAST TECHNIQUE: Multidetector CT imaging of the chest was performed using the standard protocol during bolus administration of intravenous contrast. Multiplanar CT image reconstructions and MIPs were obtained to evaluate the vascular anatomy. CONTRAST:  41mL OMNIPAQUE IOHEXOL 350 MG/ML SOLN COMPARISON:  None. FINDINGS: Cardiovascular: Satisfactory opacification of the pulmonary arteries to the segmental level. No evidence of pulmonary embolism. Mild cardiomegaly. No pericardial effusion. Atherosclerosis of thoracic  aorta is noted without aneurysm formation. Mediastinum/Nodes: No enlarged mediastinal, hilar, or axillary lymph nodes. Thyroid gland, trachea, and esophagus demonstrate no significant findings. Lungs/Pleura: No pneumothorax or pleural effusion is noted. Patchy airspace opacities are noted throughout both lungs consistent with multifocal pneumonia. Upper Abdomen: No acute abnormality. Musculoskeletal: No chest wall abnormality. No acute or significant osseous findings. Review of the MIP images confirms the above findings. IMPRESSION: 1. No definite evidence of pulmonary embolus. 2. Patchy airspace opacities are noted throughout both lungs consistent with multifocal pneumonia. 3. Aortic atherosclerosis. Aortic Atherosclerosis (ICD10-I70.0). Electronically Signed   By: Lupita Raider M.D.   On: 05/31/2020 14:32   DG Chest Port 1 View  Result Date: 05/31/2020 CLINICAL DATA:  Shortness of breath. EXAM: PORTABLE CHEST 1 VIEW COMPARISON:  March 25, 2009. FINDINGS: The heart size and mediastinal contours are within normal limits. No pneumothorax or pleural effusion is noted. Patchy airspace opacities are noted throughout both lungs consistent with multifocal  pneumonia. The visualized skeletal structures are unremarkable. IMPRESSION: Bilateral multifocal pneumonia. Electronically Signed   By: Lupita Raider M.D.   On: 05/31/2020 13:24        Scheduled Meds: . chlorpheniramine-HYDROcodone  5 mL Oral Q12H  . enoxaparin (LOVENOX) injection  40 mg Subcutaneous Q24H  . Ipratropium-Albuterol  1 puff Inhalation QID  . methylPREDNISolone (SOLU-MEDROL) injection  40 mg Intravenous Q12H   Continuous Infusions: . remdesivir 100 mg in NS 100 mL 100 mg (06/01/20 0948)     LOS: 1 day    Time spent:    Erick Blinks, MD [facility]   If 7PM-7AM, please contact night-coverage [email or web address]  06/01/2020, 2:03 PM

## 2020-06-02 ENCOUNTER — Encounter (HOSPITAL_COMMUNITY): Payer: Self-pay | Admitting: Internal Medicine

## 2020-06-02 ENCOUNTER — Other Ambulatory Visit: Payer: Self-pay

## 2020-06-02 DIAGNOSIS — R0902 Hypoxemia: Secondary | ICD-10-CM

## 2020-06-02 DIAGNOSIS — U071 COVID-19: Secondary | ICD-10-CM | POA: Diagnosis not present

## 2020-06-02 DIAGNOSIS — R7989 Other specified abnormal findings of blood chemistry: Secondary | ICD-10-CM

## 2020-06-02 DIAGNOSIS — R0603 Acute respiratory distress: Secondary | ICD-10-CM

## 2020-06-02 LAB — COMPREHENSIVE METABOLIC PANEL
ALT: 18 U/L (ref 0–44)
AST: 29 U/L (ref 15–41)
Albumin: 2.8 g/dL — ABNORMAL LOW (ref 3.5–5.0)
Alkaline Phosphatase: 48 U/L (ref 38–126)
Anion gap: 7 (ref 5–15)
BUN: 21 mg/dL (ref 8–23)
CO2: 25 mmol/L (ref 22–32)
Calcium: 8.9 mg/dL (ref 8.9–10.3)
Chloride: 106 mmol/L (ref 98–111)
Creatinine, Ser: 0.71 mg/dL (ref 0.44–1.00)
GFR, Estimated: >60 mL/min (ref >60)
Glucose, Bld: 162 mg/dL — ABNORMAL HIGH (ref 70–99)
Potassium: 5 mmol/L (ref 3.5–5.1)
Sodium: 138 mmol/L (ref 135–145)
Total Bilirubin: 0.5 mg/dL (ref 0.3–1.2)
Total Protein: 6.1 g/dL — ABNORMAL LOW (ref 6.5–8.1)

## 2020-06-02 LAB — D-DIMER, QUANTITATIVE: D-Dimer, Quant: 5.68 ug/mL-FEU — ABNORMAL HIGH (ref 0.00–0.50)

## 2020-06-02 LAB — C-REACTIVE PROTEIN: CRP: 12.4 mg/dL — ABNORMAL HIGH (ref <1.0)

## 2020-06-02 LAB — FERRITIN: Ferritin: 1037 ng/mL — ABNORMAL HIGH (ref 11–307)

## 2020-06-02 MED ORDER — FUROSEMIDE 10 MG/ML IJ SOLN
40.0000 mg | Freq: Once | INTRAMUSCULAR | Status: AC
  Administered 2020-06-02: 40 mg via INTRAVENOUS
  Filled 2020-06-02: qty 4

## 2020-06-02 MED ORDER — ENOXAPARIN SODIUM 80 MG/0.8ML ~~LOC~~ SOLN
70.0000 mg | Freq: Two times a day (BID) | SUBCUTANEOUS | Status: DC
  Administered 2020-06-02 – 2020-06-03 (×4): 70 mg via SUBCUTANEOUS
  Filled 2020-06-02 (×4): qty 0.8

## 2020-06-02 MED ORDER — PHENOL 1.4 % MT LIQD
1.0000 | OROMUCOSAL | Status: DC | PRN
  Administered 2020-06-02: 1 via OROMUCOSAL
  Filled 2020-06-02: qty 177

## 2020-06-02 NOTE — Progress Notes (Signed)
PROGRESS NOTE    Rachel [name]  NOB:[id number] DOB: 1955/07/28 DOA: 05/31/2020 PCP: Tanna Furry, MD    Brief Narrative:  [name] with a working diagnosis of acute hypoxic respiratory failure due to SARS COVID-19 viral pneumonia.  65 year old female unvaccinated for COVID-19 who presents with 7 days of upper respiratory tract infection symptoms that has progressed into dyspnea and cough for Rachel last 48 hours.  Poor appetite and generalized weakness. On her initial physical examination her oximetry was 83% on room air, placed on 6 L per nasal cannula reaching 92%, temperature 38.4 C, blood pressure 130/66, 83/68, respiratory rate 28-31, oxygen saturation 95% on 9 L/min per high flow nasal cannula.  She has dry mucous membranes, lungs with rales bilaterally but no wheezing, heart S1-S2, present rhythmic, soft abdomen no extremity edema.  Sodium 131, potassium 3.0, chloride 99, bicarb 23, glucose 106, BUN 12, creatinine 0.79, troponin I 15-18, white count 8.8, hemoglobin 0.9, hematocrit 36.7, platelets 240. SARS COVID-19 positive. Chest radiograph with multilobar interstitial infiltrates, all 4 quadrants, predominantly lower lobes, more right than left.   CT chest with no evidence of pulmonary embolism, bilateral multilobar dense groundglass opacities.  Assessment & Plan:   Principal Problem:   Pneumonia due to COVID-19 virus Active Problems:   Acute respiratory failure with hypoxia (HCC)   GERD (gastroesophageal reflux disease)   Nephrolithiasis   Hypokalemia   Hyponatremia   1. Acute hypoxemic respiratory failure due to SARS COVID-19 viral pneumonia Sp tociluzumab 02/19.   RR: 18 to 19  Pulse oxymetry:88 to 96%  Fi02: 15 L/min per HFNC/ non NRBM  COVID-19 Labs  Recent Labs    05/31/20 1703 06/01/20 0602 06/02/20 0622  DDIMER 1.44* 1.79* 5.68*  FERRITIN 734* 1,032* 1,037*  CRP 19.1* 21.7* 12.4*    No results found  for: SARSCOV2NAA  [name] continue with high oxygen requirements and high inflammatory markers, sp tocilizumab yesterday. Continue with systemic steroids 40 mg bid of methylprednisolone and antiviral therapy with Remdesivir. Continue bronchodilator therapy, antitussive agents and airway clearing techniques.  Add one dose of furosemide today for non cardiogenic pulmonary edema.  Very elevated D dimer, will increase dose of enoxaparin to therapeutic and will check [place] lower extremities for DVT, on admission CT chest was negative for PE.  Continue oxymetry monitoring, keep 02 sat more than 88%.  Follow on inflammatory markers.  Prone as tolerated 4 hrs at Rachel time for a total of 16 hr per day.   2. Hyponatremia and hypokalemia due to dehydration.  Stable renal function, serum cr is 0,71, K is 5.0 and bicarbonate at 25. Na 138.  [name] is tolerating po well, continue to follow a restrictive IV fluids strategy/  3. GERD. Continue antiacid therapy   4. Hx nephrolithiasis.  No exacerbation   [name] continue to be at high risk for worsening respiratory failure   Status is: Inpatient  Remains inpatient appropriate because:IV treatments appropriate due to intensity of illness or inability to take PO   Dispo: Rachel [name] is from: Home              Anticipated d/c is to: Home              Anticipated d/c date is: > 3 days              [name] currently is not medically stable to d/c.   Difficult to place [name] No    DVT prophylaxis: Enoxaparin  Code Status:   full  Family Communication:  I spoke over Rachel phone with Rachel [name]'s husband about [name]'s  condition, plan of care, prognosis and all questions were addressed.   Subjective: [name] continue with dyspnea but mild improvement, not yet back to baseline, no nausea or vomiting, poor oral intake and decreased energy  Objective: Vitals:   06/02/20 0619 06/02/20 0752 06/02/20 0754 06/02/20 0909  BP: 108/66   98/61  Pulse: 77    73  Resp: 18     Temp: (!) 97.2 F (36.2 C)     TempSrc:      SpO2: 98% 98% (!) 88% 96%  Weight:      Height:        Intake/Output Summary (Last 24 hours) at 06/02/2020 1122 Last data filed at 06/01/2020 1500 Gross per 24 hour  Intake 103.24 ml  Output --  Net 103.24 ml   Filed Weights   05/31/20 1244  Weight: 72.6 kg    Examination:   General: Not in pain or dyspnea, deconditioned  Neurology: Awake and alert, non focal  E ENT: no pallor, no icterus, oral mucosa moist Cardiovascular: No JVD. S1-S2 present, rhythmic, no gallops, rubs, or murmurs. No lower extremity edema. Pulmonary: positive breath sounds bilaterally, with wheezing or rhonchi, scattered rales. Gastrointestinal. Abdomen soft and non tender Skin. No rashes Musculoskeletal: no joint deformities     Data Reviewed: I have personally reviewed following labs and imaging studies  CBC: Recent Labs  Lab 05/31/20 1252  WBC 8.8  NEUTROABS 7.0  HGB 11.9*  HCT 36.7  MCV 88.9  PLT 240   Basic Metabolic Panel: Recent Labs  Lab 05/31/20 1252 06/01/20 0602 06/02/20 0622  NA 131* 139 138  K 3.0* 5.1 5.0  CL 99 105 106  CO2 23 24 25   GLUCOSE 106* 153* 162*  BUN 12 13 21   CREATININE 0.79 0.67 0.71  CALCIUM 9.0 8.9 8.9  MG  --  2.2  --    GFR: Estimated Creatinine Clearance: 70.3 mL/min (by C-G formula based on SCr of 0.71 mg/dL). Liver Function Tests: Recent Labs  Lab 05/31/20 1252 06/01/20 0602 06/02/20 0622  AST 32 28 29  ALT 21 17 18   ALKPHOS 50 46 48  BILITOT 0.9 0.7 0.5  PROT 7.3 6.6 6.1*  ALBUMIN 3.7 3.1* 2.8*   No results for input(s): LIPASE, AMYLASE in Rachel last 168 hours. No results for input(s): AMMONIA in Rachel last 168 hours. Coagulation Profile: No results for input(s): INR, PROTIME in Rachel last 168 hours. Cardiac Enzymes: No results for input(s): CKTOTAL, CKMB, CKMBINDEX, TROPONINI in Rachel last 168 hours. BNP (last 3 results) No results for input(s): PROBNP in Rachel last 8760  hours. HbA1C: No results for input(s): HGBA1C in Rachel last 72 hours. CBG: No results for input(s): GLUCAP in Rachel last 168 hours. Lipid Profile: No results for input(s): CHOL, HDL, LDLCALC, TRIG, CHOLHDL, LDLDIRECT in Rachel last 72 hours. Thyroid Function Tests: No results for input(s): TSH, T4TOTAL, FREET4, T3FREE, THYROIDAB in Rachel last 72 hours. Anemia Panel: Recent Labs    06/01/20 0602 06/02/20 0622  FERRITIN 1,032* 1,037*      Radiology Studies: I have reviewed all of Rachel imaging during this hospital visit personally     Scheduled Meds: . chlorpheniramine-HYDROcodone  5 mL Oral Q12H  . enoxaparin (LOVENOX) injection  40 mg Subcutaneous Q24H  . Ipratropium-Albuterol  1 puff Inhalation QID  . methylPREDNISolone (SOLU-MEDROL) injection  40 mg Intravenous Q12H  Continuous Infusions: . remdesivir 100 mg in NS 100 mL 100 mg (06/02/20 0915)     LOS: 2 days        Amory Zbikowski Annett Gula, MD

## 2020-06-03 ENCOUNTER — Inpatient Hospital Stay (HOSPITAL_COMMUNITY): Payer: BLUE CROSS/BLUE SHIELD

## 2020-06-03 DIAGNOSIS — K219 Gastro-esophageal reflux disease without esophagitis: Secondary | ICD-10-CM | POA: Diagnosis not present

## 2020-06-03 DIAGNOSIS — U071 COVID-19: Secondary | ICD-10-CM | POA: Diagnosis not present

## 2020-06-03 DIAGNOSIS — J9601 Acute respiratory failure with hypoxia: Secondary | ICD-10-CM | POA: Diagnosis not present

## 2020-06-03 DIAGNOSIS — E876 Hypokalemia: Secondary | ICD-10-CM | POA: Diagnosis not present

## 2020-06-03 LAB — COMPREHENSIVE METABOLIC PANEL
ALT: 17 U/L (ref 0–44)
AST: 27 U/L (ref 15–41)
Albumin: 2.9 g/dL — ABNORMAL LOW (ref 3.5–5.0)
Alkaline Phosphatase: 55 U/L (ref 38–126)
Anion gap: 11 (ref 5–15)
BUN: 26 mg/dL — ABNORMAL HIGH (ref 8–23)
CO2: 24 mmol/L (ref 22–32)
Calcium: 9 mg/dL (ref 8.9–10.3)
Chloride: 104 mmol/L (ref 98–111)
Creatinine, Ser: 0.84 mg/dL (ref 0.44–1.00)
GFR, Estimated: >60 mL/min (ref >60)
Glucose, Bld: 146 mg/dL — ABNORMAL HIGH (ref 70–99)
Potassium: 4.4 mmol/L (ref 3.5–5.1)
Sodium: 139 mmol/L (ref 135–145)
Total Bilirubin: 0.5 mg/dL (ref 0.3–1.2)
Total Protein: 6.2 g/dL — ABNORMAL LOW (ref 6.5–8.1)

## 2020-06-03 LAB — FERRITIN: Ferritin: 600 ng/mL — ABNORMAL HIGH (ref 11–307)

## 2020-06-03 LAB — C-REACTIVE PROTEIN: CRP: 6.5 mg/dL — ABNORMAL HIGH (ref <1.0)

## 2020-06-03 LAB — D-DIMER, QUANTITATIVE: D-Dimer, Quant: 3.82 ug/mL-FEU — ABNORMAL HIGH (ref 0.00–0.50)

## 2020-06-03 MED ORDER — IPRATROPIUM-ALBUTEROL 20-100 MCG/ACT IN AERS
1.0000 | INHALATION_SPRAY | Freq: Three times a day (TID) | RESPIRATORY_TRACT | Status: DC
  Administered 2020-06-03 – 2020-06-13 (×31): 1 via RESPIRATORY_TRACT

## 2020-06-03 MED ORDER — FUROSEMIDE 10 MG/ML IJ SOLN
40.0000 mg | Freq: Once | INTRAMUSCULAR | Status: AC
  Administered 2020-06-03: 40 mg via INTRAVENOUS
  Filled 2020-06-03: qty 4

## 2020-06-03 NOTE — Progress Notes (Signed)
PROGRESS NOTE    Rachel [name]  HYW:[id number] DOB: Apr 26, 1955 DOA: 05/31/2020 PCP: Tanna Furry, MD    Brief Narrative:  Rachel [name] will be admitted to the hospital with a working diagnosis of acute hypoxic respiratory failure due to SARS COVID-19 viral pneumonia.  65 year old female unvaccinated for COVID-19 who presents with 7 days of upper respiratory tract infection symptoms that has progressed into dyspnea and cough for the last 48 hours. Poor appetite and generalized weakness. On her initial physical examination her oximetry was 83% on room air, placed on 6 L per nasal cannula reaching 92%,temperature 38.4 C, blood pressure 130/66, 83/68, respiratory rate 28-31, oxygen saturation 95% on 9 L/min per high flow nasal cannula.She has dry mucous membranes, lungs with rales bilaterally but no wheezing, heart S1-S2, present rhythmic, soft abdomen no extremity edema.  Sodium 131, potassium 3.0, chloride 99, bicarb 23, glucose 106, BUN 12, creatinine 0.79, troponin I 15-18, white count 8.8, hemoglobin 0.9, hematocrit 36.7, platelets 240. SARS COVID-19 positive. Chest radiograph with multilobar interstitial infiltrates, all 4 quadrants, predominantly lower lobes, more right than left.  CT chest with no evidence of pulmonary embolism, bilateral multilobar dense groundglass opacities.  Patient with high inflammatory markers and high oxygen requirements.    Assessment & Plan:   Principal Problem:   Pneumonia due to COVID-19 virus Active Problems:   Acute respiratory failure with hypoxia (HCC)   GERD (gastroesophageal reflux disease)   Nephrolithiasis   Hypokalemia   Hyponatremia   1. Acute hypoxemic respiratory failure due to SARS COVID-19 viral pneumonia Sp tociluzumab 02/19.   RR: 18  Pulse oxymetry: 96 to 98%  Fi02: 15 L/min per HFNC and non re-breather mask.   COVID-19 Labs  Recent Labs    06/01/20 0602 06/02/20 0622 06/03/20 0523  DDIMER 1.79*  5.68* 3.82*  FERRITIN 1,032* 1,037* 600*  CRP 21.7* 12.4* 6.5*    No results found for: SARSCOV2NAA  Had one dose of furosemide yesterday for non cardiogenic pulmonary edema,  Urine output not documented. Follow up chest film today personally reviewed with bilateral interstitial infiltrates predominantly at bases and left upper lobe. Inflammatory markers are improving.   Reports improvement in dyspnea but not yet back to baseline.   Continue with systemic steroids 40 mg bid of methylprednisolone and antiviral therapy with Remdesivir #4/5.  On bronchodilator therapy, antitussive agents and airway clearing techniques.  Repeat dose of furosemide 40 mg x1 today.   Continue to encourage prone as tolerated 4 hrs at the time for a total of 16 hr per day.   Continue therapeutic anticoagulation in the setting of elevated d dimer, follow on [place] lower extremities. Her admission CT negative for PE.   2. Hyponatremia and hypokalemia due to dehydration.  poor appetite but tolerating po well. This am Na 139, K 4,4 , bicarbonate at 24 and serum cr at 0,84. Continue to hold on IV fluids, follow on renal function in am.   3. GERD. On pantoprazole,    4. Hx nephrolithiasis.  No clinical signs of exacerbation   Patient continue to be at high risk for worsening respiratory failure.   Status is: Inpatient  Remains inpatient appropriate because:IV treatments appropriate due to intensity of illness or inability to take PO   Dispo: The patient is from: Home              Anticipated d/c is to: Home              Anticipated d/c date is: >  3 days              Patient currently is not medically stable to d/c.   Difficult to place patient No   DVT prophylaxis: Enoxaparin   Code Status:   full  Family Communication:  I spoke over the phone with the patient's husband about patient's  condition, plan of care, prognosis and all questions were addressed.    Subjective: Patient reports improvement in  dyspnea and increase physical functional capacity, continue to have poor appetite, not yet back to baseline,   Objective: Vitals:   06/02/20 1953 06/02/20 2120 06/03/20 0500 06/03/20 0912  BP:  108/66 (!) 105/56   Pulse:  74 70   Resp:  18    Temp:  98.6 F (37 C) 98.7 F (37.1 C)   TempSrc:  Oral Oral   SpO2: 97% 96% 99% 98%  Weight:      Height:        Intake/Output Summary (Last 24 hours) at 06/03/2020 1002 Last data filed at 06/02/2020 1957 Gross per 24 hour  Intake 100 ml  Output -  Net 100 ml   Filed Weights   05/31/20 1244  Weight: 72.6 kg    Examination:   General: Not in pain or dyspnea, deconditioned  Neurology: Awake and alert, non focal  E ENT: mild pallor, no icterus, oral mucosa moist Cardiovascular: No JVD. S1-S2 present, rhythmic, no gallops, rubs, or murmurs. No lower extremity edema. Pulmonary: positive breath sounds bilaterally, with no wheezing,or rhonchi or scattered bilateral rales. Gastrointestinal. Abdomen soft and non tender Skin. No rashes Musculoskeletal: no joint deformities     Data Reviewed: I have personally reviewed following labs and imaging studies  CBC: Recent Labs  Lab 05/31/20 1252  WBC 8.8  NEUTROABS 7.0  HGB 11.9*  HCT 36.7  MCV 88.9  PLT 240   Basic Metabolic Panel: Recent Labs  Lab 05/31/20 1252 06/01/20 0602 06/02/20 0622 06/03/20 0523  NA 131* 139 138 139  K 3.0* 5.1 5.0 4.4  CL 99 105 106 104  CO2 23 24 25 24   GLUCOSE 106* 153* 162* 146*  BUN 12 13 21  26*  CREATININE 0.79 0.67 0.71 0.84  CALCIUM 9.0 8.9 8.9 9.0  MG  --  2.2  --   --    GFR: Estimated Creatinine Clearance: 67 mL/min (by C-G formula based on SCr of 0.84 mg/dL). Liver Function Tests: Recent Labs  Lab 05/31/20 1252 06/01/20 0602 06/02/20 0622 06/03/20 0523  AST 32 28 29 27   ALT 21 17 18 17   ALKPHOS 50 46 48 55  BILITOT 0.9 0.7 0.5 0.5  PROT 7.3 6.6 6.1* 6.2*  ALBUMIN 3.7 3.1* 2.8* 2.9*   No results for input(s): LIPASE,  AMYLASE in the last 168 hours. No results for input(s): AMMONIA in the last 168 hours. Coagulation Profile: No results for input(s): INR, PROTIME in the last 168 hours. Cardiac Enzymes: No results for input(s): CKTOTAL, CKMB, CKMBINDEX, TROPONINI in the last 168 hours. BNP (last 3 results) No results for input(s): PROBNP in the last 8760 hours. HbA1C: No results for input(s): HGBA1C in the last 72 hours. CBG: No results for input(s): GLUCAP in the last 168 hours. Lipid Profile: No results for input(s): CHOL, HDL, LDLCALC, TRIG, CHOLHDL, LDLDIRECT in the last 72 hours. Thyroid Function Tests: No results for input(s): TSH, T4TOTAL, FREET4, T3FREE, THYROIDAB in the last 72 hours. Anemia Panel: Recent Labs    06/02/20 0622 06/03/20 0523  FERRITIN 1,037* 600*  Radiology Studies: I have reviewed all of the imaging during this hospital visit personally     Scheduled Meds: . chlorpheniramine-HYDROcodone  5 mL Oral Q12H  . enoxaparin (LOVENOX) injection  70 mg Subcutaneous Q12H  . Ipratropium-Albuterol  1 puff Inhalation TID  . methylPREDNISolone (SOLU-MEDROL) injection  40 mg Intravenous Q12H   Continuous Infusions: . remdesivir 100 mg in NS 100 mL 100 mg (06/03/20 0857)     LOS: 3 days        Mauricio Annett Gula, MD

## 2020-06-03 NOTE — Progress Notes (Signed)
Alert and oriented x 4. Denies pain or discomfort. Calm and pleasant. Bedside commode present. Continued on High flow oxygen and non-rebreathe, tolerating well. Self pronning at times.No deterioration at this time.Continues on telemetry and continuous oxygen monitoring. Continue to monitor.

## 2020-06-04 DIAGNOSIS — E876 Hypokalemia: Secondary | ICD-10-CM | POA: Diagnosis not present

## 2020-06-04 DIAGNOSIS — J9601 Acute respiratory failure with hypoxia: Secondary | ICD-10-CM | POA: Diagnosis not present

## 2020-06-04 DIAGNOSIS — U071 COVID-19: Secondary | ICD-10-CM | POA: Diagnosis not present

## 2020-06-04 DIAGNOSIS — K219 Gastro-esophageal reflux disease without esophagitis: Secondary | ICD-10-CM | POA: Diagnosis not present

## 2020-06-04 LAB — COMPREHENSIVE METABOLIC PANEL
ALT: 16 U/L (ref 0–44)
AST: 27 U/L (ref 15–41)
Albumin: 3.2 g/dL — ABNORMAL LOW (ref 3.5–5.0)
Alkaline Phosphatase: 60 U/L (ref 38–126)
Anion gap: 12 (ref 5–15)
BUN: 27 mg/dL — ABNORMAL HIGH (ref 8–23)
CO2: 28 mmol/L (ref 22–32)
Calcium: 9.1 mg/dL (ref 8.9–10.3)
Chloride: 97 mmol/L — ABNORMAL LOW (ref 98–111)
Creatinine, Ser: 0.82 mg/dL (ref 0.44–1.00)
GFR, Estimated: >60 mL/min (ref >60)
Glucose, Bld: 131 mg/dL — ABNORMAL HIGH (ref 70–99)
Potassium: 4.6 mmol/L (ref 3.5–5.1)
Sodium: 137 mmol/L (ref 135–145)
Total Bilirubin: 0.8 mg/dL (ref 0.3–1.2)
Total Protein: 6.5 g/dL (ref 6.5–8.1)

## 2020-06-04 LAB — C-REACTIVE PROTEIN: CRP: 3.5 mg/dL — ABNORMAL HIGH (ref <1.0)

## 2020-06-04 LAB — D-DIMER, QUANTITATIVE: D-Dimer, Quant: 3.52 ug/mL-FEU — ABNORMAL HIGH (ref 0.00–0.50)

## 2020-06-04 LAB — FERRITIN: Ferritin: 596 ng/mL — ABNORMAL HIGH (ref 11–307)

## 2020-06-04 MED ORDER — ENOXAPARIN SODIUM 40 MG/0.4ML ~~LOC~~ SOLN: 40.0000 mg | Freq: Two times a day (BID) | SUBCUTANEOUS | Status: DC

## 2020-06-04 MED ORDER — ENOXAPARIN SODIUM 40 MG/0.4ML ~~LOC~~ SOLN
40.0000 mg | SUBCUTANEOUS | Status: DC
  Administered 2020-06-04 – 2020-06-17 (×14): 40 mg via SUBCUTANEOUS
  Filled 2020-06-04 (×13): qty 0.4

## 2020-06-04 MED ORDER — SALINE SPRAY 0.65 % NA SOLN
1.0000 | NASAL | Status: DC | PRN
  Filled 2020-06-04: qty 44

## 2020-06-04 MED ORDER — OXYMETAZOLINE HCL 0.05 % NA SOLN
1.0000 | Freq: Two times a day (BID) | NASAL | Status: DC
  Administered 2020-06-04 – 2020-06-13 (×19): 1 via NASAL
  Filled 2020-06-04 (×2): qty 30

## 2020-06-04 NOTE — Progress Notes (Signed)
At the beginning of the shift the patient was on 15 L HFNC and 15 NRB.  Dr. Kerry Hough rounded on patient and noticed that her NRB was off and her O2 sats were in the 70s.  However, the patient was in no respiratory distress, lips were pink and patient stated that she was feeling fine.  Dr. Kerry Hough requested that her pulse ox be put on her ear lobe and O2 sats rechecked.  Patient's O2 saturation was in the 80s on 15 L HFNC.  The NRB was taken off and patient's O2 remained in the upper 70s-mid 80s.  The patient does not appear to be in any distress and continues to state that she feels fine.  She regularly uses her incentive spirometer and flutter valve.  Patient does lie prone and O2 sats when lying prone on 15 L HFNC are >90%.  The NRB is still near the patient on 10 L in case she feels like she needs more oxygen.

## 2020-06-04 NOTE — Progress Notes (Signed)
Pt spo2 82% on 15lpm salter HFNC upon entering room to do inhaler pt was prone and resting. Pt placed on partial rebreather mask and 15 lpm HFNC spo2 85% with proning after treatment.  Changed pt over to 100% NRB and 15l HFNC 88% and climbing. RT will continue to monitor pt RN informed

## 2020-06-04 NOTE — Plan of Care (Signed)
Alert and oriented x 4. Denies pain or discomfort. Continues on high flow oxygen and non-rebreather. Self pronning and pulmonary hygiene encouraged. Continue plan of care. Problem: Education: Goal: Knowledge of General Education information will improve Description: Including pain rating scale, medication(s)/side effects and non-pharmacologic comfort measures Outcome: Progressing   Problem: Health Behavior/Discharge Planning: Goal: Ability to manage health-related needs will improve Outcome: Progressing   Problem: Clinical Measurements: Goal: Ability to maintain clinical measurements within normal limits will improve Outcome: Progressing Goal: Will remain free from infection Outcome: Progressing Goal: Diagnostic test results will improve Outcome: Progressing Goal: Respiratory complications will improve Outcome: Progressing Goal: Cardiovascular complication will be avoided Outcome: Progressing   Problem: Activity: Goal: Risk for activity intolerance will decrease Outcome: Progressing   Problem: Nutrition: Goal: Adequate nutrition will be maintained Outcome: Progressing   Problem: Coping: Goal: Level of anxiety will decrease Outcome: Progressing   Problem: Elimination: Goal: Will not experience complications related to bowel motility Outcome: Progressing Goal: Will not experience complications related to urinary retention Outcome: Progressing   Problem: Pain Managment: Goal: General experience of comfort will improve Outcome: Progressing   Problem: Safety: Goal: Ability to remain free from injury will improve Outcome: Progressing   Problem: Skin Integrity: Goal: Risk for impaired skin integrity will decrease Outcome: Progressing

## 2020-06-04 NOTE — Progress Notes (Signed)
PROGRESS NOTE    Rachel [name]  LNL:[id number] DOB: 1956-01-18 DOA: 05/31/2020 PCP: Tanna Furry, MD    Brief Narrative:  Rachel [name] will be admitted to the hospital with a working diagnosis of acute hypoxic respiratory failure due to SARS COVID-19 viral pneumonia.  65 year old female unvaccinated for COVID-19 who presents with 7 days of upper respiratory tract infection symptoms that has progressed into dyspnea and cough for the last 48 hours. Poor appetite and generalized weakness. On her initial physical examination her oximetry was 83% on room air, placed on 6 L per nasal cannula reaching 92%,temperature 38.4 C, blood pressure 130/66, 83/68, respiratory rate 28-31, oxygen saturation 95% on 9 L/min per high flow nasal cannula.She has dry mucous membranes, lungs with rales bilaterally but no wheezing, heart S1-S2, present rhythmic, soft abdomen no extremity edema.  Sodium 131, potassium 3.0, chloride 99, bicarb 23, glucose 106, BUN 12, creatinine 0.79, troponin I 15-18, white count 8.8, hemoglobin 0.9, hematocrit 36.7, platelets 240. SARS COVID-19 positive. Chest radiograph with multilobar interstitial infiltrates, all 4 quadrants, predominantly lower lobes, more right than left.  CT chest with no evidence of pulmonary embolism, bilateral multilobar dense groundglass opacities.  Patient with high inflammatory markers and high oxygen requirements.    Assessment & Plan:   Principal Problem:   Pneumonia due to COVID-19 virus Active Problems:   Acute respiratory failure with hypoxia (HCC)   GERD (gastroesophageal reflux disease)   Nephrolithiasis   Hypokalemia   Hyponatremia   1. Acute hypoxemic respiratory failure due to SARS COVID-19 viral pneumonia Sp tociluzumab 02/19.   Patient is currently on 15 L via high flow nasal cannula and nonrebreather mask She appears to be breathing comfortably and does not feel increasingly short of breath Pulse oximetry  reading in the 70s on her finger, but this appears to be unreliable since it does not match her clinical picture I have requested that pulse ox be moved to her earlobe for more accurate readings  COVID-19 Labs  Recent Labs    06/02/20 0622 06/03/20 0523 06/04/20 0537  DDIMER 5.68* 3.82* 3.52*  FERRITIN 1,037* 600* 596*  CRP 12.4* 6.5* 3.5*    No results found for: SARSCOV2NAA  Inflammatory markers are improving.   Reports improvement in dyspnea but not yet back to baseline.  Will attempt to discontinue nonrebreather mask today  Continue with systemic steroids 40 mg bid of methylprednisolone and antiviral therapy with Remdesivir #5/5.  On bronchodilator therapy, antitussive agents and airway clearing techniques.  Continue to encourage prone as tolerated 4 hrs at the time for a total of 16 hr per day.   She did have elevated D-dimer, but ultrasound of lower extremities and CT chest is negative for VTE. Continue to Lovenox back to DVT prophylaxis dose  2. Hyponatremia and hypokalemia due to dehydration.  poor appetite but tolerating po well. Overall electrolytes have corrected and volume status has improved  3. GERD. On pantoprazole,    4. Hx nephrolithiasis.  No clinical signs of exacerbation   Patient continue to be at high risk for worsening respiratory failure.   Status is: Inpatient  Remains inpatient appropriate because:IV treatments appropriate due to intensity of illness or inability to take PO   Dispo: The patient is from: Home              Anticipated d/c is to: Home              Anticipated d/c date is: > 3 days  Patient currently is not medically stable to d/c.   Difficult to place patient No   DVT prophylaxis: Enoxaparin   Code Status:   full  Family Communication:  I spoke over the phone with the patient's husband about patient's  condition, plan of care, prognosis and all questions were addressed.    Subjective: She reports having  some epistaxis. She has persistent cough and possibly some hemoptysis. Overall, she feels that her breathing is doing better.  Objective: Vitals:   06/04/20 0741 06/04/20 0950 06/04/20 1356 06/04/20 1603  BP:   (!) 144/71   Pulse:   60   Resp:   20   Temp:      TempSrc:      SpO2: 94% 95% 98% 98%  Weight:      Height:        Intake/Output Summary (Last 24 hours) at 06/04/2020 1943 Last data filed at 06/03/2020 1947 Gross per 24 hour  Intake 3.82 ml  Output --  Net 3.82 ml   Filed Weights   05/31/20 1244  Weight: 72.6 kg    Examination:   General exam: Alert, awake, oriented x 3 Respiratory system: Clear to auscultation. Respiratory effort normal. Cardiovascular system:RRR. No murmurs, rubs, gallops. Gastrointestinal system: Abdomen is nondistended, soft and nontender. No organomegaly or masses felt. Normal bowel sounds heard. Central nervous system: Alert and oriented. No focal neurological deficits. Extremities: No C/C/E, +pedal pulses Skin: No rashes, lesions or ulcers Psychiatry: Judgement and insight appear normal. Mood & affect appropriate.    Data Reviewed: I have personally reviewed following labs and imaging studies  CBC: Recent Labs  Lab 05/31/20 1252  WBC 8.8  NEUTROABS 7.0  HGB 11.9*  HCT 36.7  MCV 88.9  PLT 240   Basic Metabolic Panel: Recent Labs  Lab 05/31/20 1252 06/01/20 0602 06/02/20 0622 06/03/20 0523 06/04/20 0537  NA 131* 139 138 139 137  K 3.0* 5.1 5.0 4.4 4.6  CL 99 105 106 104 97*  CO2 23 24 25 24 28   GLUCOSE 106* 153* 162* 146* 131*  BUN 12 13 21  26* 27*  CREATININE 0.79 0.67 0.71 0.84 0.82  CALCIUM 9.0 8.9 8.9 9.0 9.1  MG  --  2.2  --   --   --    GFR: Estimated Creatinine Clearance: 68.6 mL/min (by C-G formula based on SCr of 0.82 mg/dL). Liver Function Tests: Recent Labs  Lab 05/31/20 1252 06/01/20 0602 06/02/20 0622 06/03/20 0523 06/04/20 0537  AST 32 28 29 27 27   ALT 21 17 18 17 16   ALKPHOS 50 46 48 55 60   BILITOT 0.9 0.7 0.5 0.5 0.8  PROT 7.3 6.6 6.1* 6.2* 6.5  ALBUMIN 3.7 3.1* 2.8* 2.9* 3.2*   No results for input(s): LIPASE, AMYLASE in the last 168 hours. No results for input(s): AMMONIA in the last 168 hours. Coagulation Profile: No results for input(s): INR, PROTIME in the last 168 hours. Cardiac Enzymes: No results for input(s): CKTOTAL, CKMB, CKMBINDEX, TROPONINI in the last 168 hours. BNP (last 3 results) No results for input(s): PROBNP in the last 8760 hours. HbA1C: No results for input(s): HGBA1C in the last 72 hours. CBG: No results for input(s): GLUCAP in the last 168 hours. Lipid Profile: No results for input(s): CHOL, HDL, LDLCALC, TRIG, CHOLHDL, LDLDIRECT in the last 72 hours. Thyroid Function Tests: No results for input(s): TSH, T4TOTAL, FREET4, T3FREE, THYROIDAB in the last 72 hours. Anemia Panel: Recent Labs    06/03/20 0523 06/04/20 0537  FERRITIN 600* 596*      Radiology Studies: I have reviewed all of the imaging during this hospital visit personally     Scheduled Meds: . chlorpheniramine-HYDROcodone  5 mL Oral Q12H  . enoxaparin (LOVENOX) injection  40 mg Subcutaneous Q24H  . Ipratropium-Albuterol  1 puff Inhalation TID  . methylPREDNISolone (SOLU-MEDROL) injection  40 mg Intravenous Q12H  . oxymetazoline  1 spray Each Nare BID   Continuous Infusions:    LOS: 4 days        Erick Blinks, MD

## 2020-06-05 DIAGNOSIS — R0603 Acute respiratory distress: Secondary | ICD-10-CM

## 2020-06-05 DIAGNOSIS — U071 COVID-19: Secondary | ICD-10-CM | POA: Diagnosis not present

## 2020-06-05 DIAGNOSIS — E876 Hypokalemia: Secondary | ICD-10-CM | POA: Diagnosis not present

## 2020-06-05 DIAGNOSIS — J9601 Acute respiratory failure with hypoxia: Secondary | ICD-10-CM | POA: Diagnosis not present

## 2020-06-05 DIAGNOSIS — R7989 Other specified abnormal findings of blood chemistry: Secondary | ICD-10-CM

## 2020-06-05 DIAGNOSIS — E871 Hypo-osmolality and hyponatremia: Secondary | ICD-10-CM | POA: Diagnosis not present

## 2020-06-05 DIAGNOSIS — R0902 Hypoxemia: Secondary | ICD-10-CM

## 2020-06-05 LAB — COMPREHENSIVE METABOLIC PANEL
ALT: 17 U/L (ref 0–44)
AST: 28 U/L (ref 15–41)
Albumin: 3 g/dL — ABNORMAL LOW (ref 3.5–5.0)
Alkaline Phosphatase: 59 U/L (ref 38–126)
Anion gap: 12 (ref 5–15)
BUN: 27 mg/dL — ABNORMAL HIGH (ref 8–23)
CO2: 29 mmol/L (ref 22–32)
Calcium: 9.3 mg/dL (ref 8.9–10.3)
Chloride: 98 mmol/L (ref 98–111)
Creatinine, Ser: 0.78 mg/dL (ref 0.44–1.00)
GFR, Estimated: >60 mL/min (ref >60)
Glucose, Bld: 142 mg/dL — ABNORMAL HIGH (ref 70–99)
Potassium: 4.8 mmol/L (ref 3.5–5.1)
Sodium: 139 mmol/L (ref 135–145)
Total Bilirubin: 0.7 mg/dL (ref 0.3–1.2)
Total Protein: 5.8 g/dL — ABNORMAL LOW (ref 6.5–8.1)

## 2020-06-05 LAB — C-REACTIVE PROTEIN: CRP: 2.1 mg/dL — ABNORMAL HIGH (ref <1.0)

## 2020-06-05 LAB — D-DIMER, QUANTITATIVE: D-Dimer, Quant: 14.12 ug/mL-FEU — ABNORMAL HIGH (ref 0.00–0.50)

## 2020-06-05 LAB — FERRITIN: Ferritin: 660 ng/mL — ABNORMAL HIGH (ref 11–307)

## 2020-06-05 MED ORDER — METHYLPREDNISOLONE SODIUM SUCC 125 MG IJ SOLR
70.0000 mg | Freq: Two times a day (BID) | INTRAMUSCULAR | Status: AC
  Administered 2020-06-05 – 2020-06-11 (×13): 70 mg via INTRAVENOUS
  Filled 2020-06-05 (×13): qty 2

## 2020-06-05 MED ORDER — METHYLPREDNISOLONE SODIUM SUCC 40 MG IJ SOLR
40.0000 mg | Freq: Once | INTRAMUSCULAR | Status: AC
  Administered 2020-06-05: 40 mg via INTRAVENOUS
  Filled 2020-06-05: qty 1

## 2020-06-05 NOTE — Plan of Care (Signed)
  Problem: Education: Goal: Knowledge of General Education information will improve Description: Including pain rating scale, medication(s)/side effects and non-pharmacologic comfort measures Outcome: Progressing   Problem: Health Behavior/Discharge Planning: Goal: Ability to manage health-related needs will improve Outcome: Progressing   Problem: Education: Goal: Knowledge of risk factors and measures for prevention of condition will improve Outcome: Progressing   Problem: Coping: Goal: Psychosocial and spiritual needs will be supported Outcome: Progressing   

## 2020-06-05 NOTE — Progress Notes (Signed)
PROGRESS NOTE  Gerldine Suleiman DEY:[id number] DOB: 1955/10/24 DOA: 05/31/2020 PCP: Tanna Furry, MD  Brief History:  Mrs. Rachel will be admitted to the hospital with a working diagnosis of acute hypoxic respiratory failure due to SARS COVID-19 viral pneumonia.  65 year old female unvaccinated for COVID-19 who presents with 7 days of upper respiratory tract infection symptoms that has progressed into dyspnea and cough for the last 48 hours. Poor appetite and generalized weakness. On her initial physical examination her oximetry was 83% on room air, placed on 6 L per nasal cannula reaching 92%,temperature 38.4 C, blood pressure 130/66, 83/68, respiratory rate 28-31, oxygen saturation 95% on 9 L/min per high flow nasal cannula.She has dry mucous membranes, lungs with rales bilaterally but no wheezing, heart S1-S2, present rhythmic, soft abdomen no extremity edema.  Sodium 131, potassium 3.0, chloride 99, bicarb 23, glucose 106, BUN 12, creatinine 0.79, troponin I 15-18, white count 8.8, hemoglobin 0.9, hematocrit 36.7, platelets 240. SARS COVID-19 positive. Chest radiograph with multilobar interstitial infiltrates, all 4 quadrants, predominantly lower lobes, more right than left.  CT chest with no evidence of pulmonary embolism, bilateral multilobar dense groundglass opacities.  Patient with high inflammatory markers and high oxygen requirements.  Assessment/Plan: Acute hypoxemic respiratory failure due toSARS COVID-19 viral pneumonia Sp tociluzumab 02/19.  Patient is currently on 15 L via high flow nasal cannula and nonrebreather mask -appears to be breathing comfortably and does not feel increasingly short of breath -finished 5 days remdesivir -increase IV solumedrol to 70 mg bid -On bronchodilator therapy, antitussive agents and airway clearing techniques. -Continue to encourage prone as tolerated 4 hrs at the time for a total of 16 hr per day.  -She  did have elevated D-dimer, but ultrasound of lower extremities and CT chest is negative for VTE.  -CRP 21.7>>12.4>>3.5>>2.1 -Ferritin 1032>>1037>>600>>596>>660 -D-dimer 1.79>.5.68>>3.52>>14.12  Hyponatremia and hypokalemia due to dehydration.  -due to volume depletion and poor solute intake Overall electrolytes have corrected and volume status has improved  GERD.  -continue protonix  Hx nephrolithiasis.  -No clinical signs of exacerbation      Status is: Inpatient  Remains inpatient appropriate because:IV treatments appropriate due to intensity of illness or inability to take PO   Dispo: The patient is from: Home              Anticipated d/c is to: Home              Anticipated d/c date is: > 3 days              Patient currently is not medically stable to d/c.   Difficult to place patient No        Family Communication:  no Family at bedside  Consultants:  none  Code Status:  FULL /  DVT Prophylaxis: [place] Lovenox   Procedures: As Listed in Progress Note Above  Antibiotics: None      Subjective: Overall patient states breathing is improving but remains sob with minimal exertion.  Patient denies fevers, chills, headache, chest pain, nausea, vomiting, diarrhea, abdominal pain, dysuria, hematuria, hematochezia, and melena.   Objective: Vitals:   06/04/20 2159 06/04/20 2159 06/05/20 0548 06/05/20 0754  BP: 121/62 121/62 111/69   Pulse: 63 (!) 59 74   Resp: 20 20 20    Temp: 98.8 F (37.1 C) 98.8 F (37.1 C) (!) 96.9 F (36.1 C)   TempSrc: Oral Oral    SpO2: 94% 96% 94% (!) 86%  Weight:      Height:       No intake or output data in the 24 hours ending 06/05/20 1443 Weight change:  Exam:   General:  Pt is alert, follows commands appropriately, not in acute distress  HEENT: No icterus, No thrush, No neck mass, [place]/AT  Cardiovascular: RRR, S1/S2, no rubs, no gallops  Respiratory: diminished BS bilateral. No wheeze.  Bibasilar  rales  Abdomen: Soft/+BS, non tender, non distended, no guarding  Extremities: No edema, No lymphangitis, No petechiae, No rashes, no synovitis   Data Reviewed: I have personally reviewed following labs and imaging studies Basic Metabolic Panel: Recent Labs  Lab 06/01/20 0602 06/02/20 0622 06/03/20 0523 06/04/20 0537 06/05/20 0421  NA 139 138 139 137 139  K 5.1 5.0 4.4 4.6 4.8  CL 105 106 104 97* 98  CO2 24 25 24 28 29   GLUCOSE 153* 162* 146* 131* 142*  BUN 13 21 26* 27* 27*  CREATININE 0.67 0.71 0.84 0.82 0.78  CALCIUM 8.9 8.9 9.0 9.1 9.3  MG 2.2  --   --   --   --    Liver Function Tests: Recent Labs  Lab 06/01/20 0602 06/02/20 0622 06/03/20 0523 06/04/20 0537 06/05/20 0421  AST 28 29 27 27 28   ALT 17 18 17 16 17   ALKPHOS 46 48 55 60 59  BILITOT 0.7 0.5 0.5 0.8 0.7  PROT 6.6 6.1* 6.2* 6.5 5.8*  ALBUMIN 3.1* 2.8* 2.9* 3.2* 3.0*   No results for input(s): LIPASE, AMYLASE in the last 168 hours. No results for input(s): AMMONIA in the last 168 hours. Coagulation Profile: No results for input(s): INR, PROTIME in the last 168 hours. CBC: Recent Labs  Lab 05/31/20 1252  WBC 8.8  NEUTROABS 7.0  HGB 11.9*  HCT 36.7  MCV 88.9  PLT 240   Cardiac Enzymes: No results for input(s): CKTOTAL, CKMB, CKMBINDEX, TROPONINI in the last 168 hours. BNP: Invalid input(s): POCBNP CBG: No results for input(s): GLUCAP in the last 168 hours. HbA1C: No results for input(s): HGBA1C in the last 72 hours. Urine analysis: No results found for: COLORURINE, APPEARANCEUR, LABSPEC, PHURINE, GLUCOSEU, HGBUR, BILIRUBINUR, KETONESUR, PROTEINUR, UROBILINOGEN, NITRITE, LEUKOCYTESUR Sepsis Labs: @LABRCNTIP (procalcitonin:4,lacticidven:4) )No results found for this or any previous visit (from the past 240 hour(s)).   Scheduled Meds: . chlorpheniramine-HYDROcodone  5 mL Oral Q12H  . enoxaparin (LOVENOX) injection  40 mg Subcutaneous Q24H  . Ipratropium-Albuterol  1 puff Inhalation TID   . methylPREDNISolone (SOLU-MEDROL) injection  70 mg Intravenous Q12H  . methylPREDNISolone (SOLU-MEDROL) injection  40 mg Intravenous Once  . oxymetazoline  1 spray Each Nare BID   Continuous Infusions:  Procedures/Studies: DG Chest 1 View  Result Date: 06/03/2020 CLINICAL DATA:  COVID 19 pneumonia. EXAM: CHEST  1 VIEW COMPARISON:  CT a chest in chest x-ray dated May 31, 2020. FINDINGS: Unchanged mild cardiomegaly. Diffuse bilateral interstitial and patchy bibasilar airspace opacities are not significantly changed. No pleural effusion or pneumothorax. No acute osseous abnormality. IMPRESSION: 1. Unchanged multifocal COVID-19 pneumonia. Electronically Signed   By: 06/02/20 M.D.   On: 06/03/2020 12:31   CT Angio Chest PE W/Cm &/Or Wo Cm  Result Date: 05/31/2020 CLINICAL DATA:  Shortness of breath.  COVID-19 positive. EXAM: CT ANGIOGRAPHY CHEST WITH CONTRAST TECHNIQUE: Multidetector CT imaging of the chest was performed using the standard protocol during bolus administration of intravenous contrast. Multiplanar CT image reconstructions and MIPs were obtained to evaluate the vascular anatomy. CONTRAST:  55mL OMNIPAQUE  IOHEXOL 350 MG/ML SOLN COMPARISON:  None. FINDINGS: Cardiovascular: Satisfactory opacification of the pulmonary arteries to the segmental level. No evidence of pulmonary embolism. Mild cardiomegaly. No pericardial effusion. Atherosclerosis of thoracic aorta is noted without aneurysm formation. Mediastinum/Nodes: No enlarged mediastinal, hilar, or axillary lymph nodes. Thyroid gland, trachea, and esophagus demonstrate no significant findings. Lungs/Pleura: No pneumothorax or pleural effusion is noted. Patchy airspace opacities are noted throughout both lungs consistent with multifocal pneumonia. Upper Abdomen: No acute abnormality. Musculoskeletal: No chest wall abnormality. No acute or significant osseous findings. Review of the MIP images confirms the above findings. IMPRESSION:  1. No definite evidence of pulmonary embolus. 2. Patchy airspace opacities are noted throughout both lungs consistent with multifocal pneumonia. 3. Aortic atherosclerosis. Aortic Atherosclerosis (ICD10-I70.0). Electronically Signed   By: Lupita Raider M.D.   On: 05/31/2020 14:32   US Venous Img Lower Bilateral (DVT)  Result Date: 06/03/2020 CLINICAL DATA:  Elevated D-dimer. Former smoker. Patient is currently on anticoagulation. Evaluate for DVT. EXAM: BILATERAL LOWER EXTREMITY VENOUS DOPPLER ULTRASOUND TECHNIQUE: Gray-scale sonography with graded compression, as well as color Doppler and duplex ultrasound were performed to evaluate the lower extremity deep venous systems from the level of the common femoral vein and including the common femoral, femoral, profunda femoral, popliteal and calf veins including the posterior tibial, peroneal and gastrocnemius veins when visible. The superficial great saphenous vein was also interrogated. Spectral Doppler was utilized to evaluate flow at rest and with distal augmentation maneuvers in the common femoral, femoral and popliteal veins. COMPARISON:  None. FINDINGS: RIGHT LOWER EXTREMITY Common Femoral Vein: No evidence of thrombus. Normal compressibility, respiratory phasicity and response to augmentation. Saphenofemoral Junction: No evidence of thrombus. Normal compressibility and flow on color Doppler imaging. Profunda Femoral Vein: No evidence of thrombus. Normal compressibility and flow on color Doppler imaging. Femoral Vein: No evidence of thrombus. Normal compressibility, respiratory phasicity and response to augmentation. Popliteal Vein: No evidence of thrombus. Normal compressibility, respiratory phasicity and response to augmentation. Calf Veins: No evidence of thrombus. Normal compressibility and flow on color Doppler imaging. Superficial Great Saphenous Vein: No evidence of thrombus. Normal compressibility. Venous Reflux:  None. Other Findings: Note is made  of an approximately 2.2 x 1.2 x 1.1 cm anechoic fluid collection within the right popliteal fossa compatible with a Baker's cyst. LEFT LOWER EXTREMITY Common Femoral Vein: No evidence of thrombus. Normal compressibility, respiratory phasicity and response to augmentation. Saphenofemoral Junction: No evidence of thrombus. Normal compressibility and flow on color Doppler imaging. Profunda Femoral Vein: No evidence of thrombus. Normal compressibility and flow on color Doppler imaging. Femoral Vein: No evidence of thrombus. Normal compressibility, respiratory phasicity and response to augmentation. Popliteal Vein: No evidence of thrombus. Normal compressibility, respiratory phasicity and response to augmentation. Calf Veins: No evidence of thrombus. Normal compressibility and flow on color Doppler imaging. Superficial Great Saphenous Vein: No evidence of thrombus. Normal compressibility. Venous Reflux:  None. Other Findings:  None. IMPRESSION: 1. No evidence of DVT within either lower extremity. 2. Incidental note made of an approximately 2.2 cm right-sided Baker's cyst. Electronically Signed   By: Simonne Come M.D.   On: 06/03/2020 15:42   DG Chest Port 1 View  Result Date: 05/31/2020 CLINICAL DATA:  Shortness of breath. EXAM: PORTABLE CHEST 1 VIEW COMPARISON:  March 25, 2009. FINDINGS: The heart size and mediastinal contours are within normal limits. No pneumothorax or pleural effusion is noted. Patchy airspace opacities are noted throughout both lungs consistent with multifocal pneumonia. The visualized skeletal  structures are unremarkable. IMPRESSION: Bilateral multifocal pneumonia. Electronically Signed   By: Lupita RaiderJames  Green Jr M.D.   On: 05/31/2020 13:24    Catarina Hartshornavid Robertson Colclough, DO  [facility]  If 7PM-7AM, please contact night-coverage [email or web address] Password [email or web address] 06/05/2020, 2:43 PM   LOS: 5 days

## 2020-06-06 DIAGNOSIS — Z7189 Other specified counseling: Secondary | ICD-10-CM | POA: Diagnosis not present

## 2020-06-06 DIAGNOSIS — J9601 Acute respiratory failure with hypoxia: Secondary | ICD-10-CM | POA: Diagnosis not present

## 2020-06-06 DIAGNOSIS — U071 COVID-19: Secondary | ICD-10-CM | POA: Diagnosis not present

## 2020-06-06 DIAGNOSIS — Z66 Do not resuscitate: Secondary | ICD-10-CM | POA: Diagnosis not present

## 2020-06-06 LAB — CBC
HCT: 42.6 % (ref 36.0–46.0)
Hemoglobin: 14 g/dL (ref 12.0–15.0)
MCH: 29.7 pg (ref 26.0–34.0)
MCHC: 32.9 g/dL (ref 30.0–36.0)
MCV: 90.3 fL (ref 80.0–100.0)
Platelets: 219 10*3/uL (ref 150–400)
RBC: 4.72 MIL/uL (ref 3.87–5.11)
RDW: 13.1 % (ref 11.5–15.5)
WBC: 15.5 10*3/uL — ABNORMAL HIGH (ref 4.0–10.5)
nRBC: 0 % (ref 0.0–0.2)

## 2020-06-06 LAB — COMPREHENSIVE METABOLIC PANEL
ALT: 23 U/L (ref 0–44)
AST: 32 U/L (ref 15–41)
Albumin: 3.1 g/dL — ABNORMAL LOW (ref 3.5–5.0)
Alkaline Phosphatase: 64 U/L (ref 38–126)
Anion gap: 13 (ref 5–15)
BUN: 27 mg/dL — ABNORMAL HIGH (ref 8–23)
CO2: 27 mmol/L (ref 22–32)
Calcium: 9.2 mg/dL (ref 8.9–10.3)
Chloride: 98 mmol/L (ref 98–111)
Creatinine, Ser: 0.74 mg/dL (ref 0.44–1.00)
GFR, Estimated: >60 mL/min (ref >60)
Glucose, Bld: 157 mg/dL — ABNORMAL HIGH (ref 70–99)
Potassium: 4.7 mmol/L (ref 3.5–5.1)
Sodium: 138 mmol/L (ref 135–145)
Total Bilirubin: 0.8 mg/dL (ref 0.3–1.2)
Total Protein: 6.1 g/dL — ABNORMAL LOW (ref 6.5–8.1)

## 2020-06-06 LAB — D-DIMER, QUANTITATIVE: D-Dimer, Quant: >20 ug/mL-FEU — ABNORMAL HIGH (ref 0.00–0.50)

## 2020-06-06 LAB — FERRITIN: Ferritin: 703 ng/mL — ABNORMAL HIGH (ref 11–307)

## 2020-06-06 LAB — C-REACTIVE PROTEIN: CRP: 0.9 mg/dL (ref <1.0)

## 2020-06-06 MED ORDER — ALUM & MAG HYDROXIDE-SIMETH 200-200-20 MG/5ML PO SUSP
30.0000 mL | ORAL | Status: DC | PRN
  Administered 2020-06-06 – 2020-06-10 (×3): 30 mL via ORAL
  Filled 2020-06-06 (×3): qty 30

## 2020-06-06 NOTE — Progress Notes (Signed)
Patient states she has changed her mind about code status after speaking with spouse. She now wants full scope of care. -change to full code in [facility]

## 2020-06-06 NOTE — Plan of Care (Signed)
  Problem: Education: Goal: Knowledge of General Education information will improve Description: Including pain rating scale, medication(s)/side effects and non-pharmacologic comfort measures Outcome: Progressing   Problem: Health Behavior/Discharge Planning: Goal: Ability to manage health-related needs will improve Outcome: Progressing   Problem: Education: Goal: Knowledge of risk factors and measures for prevention of condition will improve Outcome: Progressing   Problem: Coping: Goal: Psychosocial and spiritual needs will be supported Outcome: Progressing   Problem: Respiratory: Goal: Will maintain a patent airway Outcome: Progressing

## 2020-06-06 NOTE — Progress Notes (Addendum)
PROGRESS NOTE  Rachel [name] LEX:[id number] DOB: 1955/05/17 DOA: 05/31/2020 PCP: Tanna Furry, MD  Brief History:  [name] will be admitted to the hospital with a working diagnosis of acute hypoxic respiratory failure due to SARS COVID-19 viral pneumonia.  65 year old female unvaccinated for COVID-19 who presents with 7 days of upper respiratory tract infection symptoms that has progressed into dyspnea and cough for the last 48 hours. Poor appetite and generalized weakness. On her initial physical examination her oximetry was 83% on room air, placed on 6 L per nasal cannula reaching 92%,temperature 38.4 C, blood pressure 130/66, 83/68, respiratory rate 28-31, oxygen saturation 95% on 9 L/min per high flow nasal cannula.She has dry mucous membranes, lungs with rales bilaterally but no wheezing, heart S1-S2, present rhythmic, soft abdomen no extremity edema.  Sodium 131, potassium 3.0, chloride 99, bicarb 23, glucose 106, BUN 12, creatinine 0.79, troponin I 15-18, white count 8.8, hemoglobin 0.9, hematocrit 36.7, platelets 240. SARS COVID-19 positive. Chest radiograph with multilobar interstitial infiltrates, all 4 quadrants, predominantly lower lobes, more right than left.  CT chest with no evidence of pulmonary embolism, bilateral multilobar dense groundglass opacities.  Patient with high inflammatory markers and high oxygen requirements initially. tociluzumab 02/19 and started on IV steroids.   Assessment/Plan: Acute hypoxemic respiratory failure due to COVID-19 viral pneumonia Sp tociluzumab 02/19.  Patient is currently on 15 L via high flow nasal cannula and nonrebreather mask -appears to be breathing comfortably and does not feel increasingly short of breath -finished 5 days remdesivir -increased IV solumedrol to 70 mg bid -On bronchodilator therapy, antitussive agents and airway clearing techniques. -Continue to encourage prone as tolerated 4  hrs at the time for a total of 16 hr per day. -She did have elevated D-dimer, but ultrasound of lower extremities and CT chest is negative for VTE.  -CRP 21.7>>12.4>>3.5>>2.1>0.9 -Ferritin 1032>>1037>>600>>596>>660>>703 -D-dimer 1.79>.5.68>>3.52>>14.12>>20.00  Hyponatremia and hypokalemia due to dehydration.  -due to volume depletion and poor solute intake -Overall electrolytes have corrected and volume status has improved  GERD.  -continue protonix  Hx nephrolithiasis.  -No clinical signs of exacerbation  Goals of Care Advance care planning, including the explanation and discussion of advance directives was carried out with the patient and family.  Code status including explanations of "Full Code" and "DNR" and alternatives were discussed in detail.  Discussion of end-of-life issues including but not limited palliative care, hospice care and the concept of hospice, other end-of-life care options, power of attorney for health care decisions, living wills, and physician orders for life-sustaining treatment were also discussed with the patient and family.  Total face to face time 16 minutes. -confirmed DNR with patient who is A&O x 4       Status is: Inpatient  Remains inpatient appropriate because:IV treatments appropriate due to intensity of illness or inability to take PO   Dispo: The patient is from: Home  Anticipated d/c is to: Home  Anticipated d/c date is: > 3 days  Patient currently is not medically stable to d/c.              Difficult to place patient No        Family Communication:  no Family at bedside  Consultants:  none  Code Status:  DNR  DVT Prophylaxis: [place] Lovenox   Procedures: As Listed in Progress Note Above  Antibiotics: None    Total time spent 35 minutes.  Greater than 50% spent face  to face counseling and coordinating care.    Subjective: Patient is overall breathing  better, but gets sob with minimal activity.  Denies f/c, cp, n/v/d, abd pain.  Objective: Vitals:   06/05/20 2007 06/05/20 2131 06/06/20 0558 06/06/20 0819  BP:  114/60 97/62   Pulse:  85 72   Resp:  18 17   Temp:  (!) 97.1 F (36.2 C) (!) 97.2 F (36.2 C)   TempSrc:      SpO2: (S) (!) 85% 91% 91% 90%  Weight:      Height:       No intake or output data in the 24 hours ending 06/06/20 1445 Weight change:  Exam:   General:  Pt is alert, follows commands appropriately, not in acute distress  HEENT: No icterus, No thrush, No neck mass, [place]/AT  Cardiovascular: RRR, S1/S2, no rubs, no gallops  Respiratory: bilateral scattered rales. No wheeze  Abdomen: Soft/+BS, non tender, non distended, no guarding  Extremities: No edema, No lymphangitis, No petechiae, No rashes, no synovitis   Data Reviewed: I have personally reviewed following labs and imaging studies Basic Metabolic Panel: Recent Labs  Lab 06/01/20 0602 06/02/20 0622 06/03/20 0523 06/04/20 0537 06/05/20 0421 06/06/20 0404  NA 139 138 139 137 139 138  K 5.1 5.0 4.4 4.6 4.8 4.7  CL 105 106 104 97* 98 98  CO2 24 25 24 28 29 27   GLUCOSE 153* 162* 146* 131* 142* 157*  BUN 13 21 26* 27* 27* 27*  CREATININE 0.67 0.71 0.84 0.82 0.78 0.74  CALCIUM 8.9 8.9 9.0 9.1 9.3 9.2  MG 2.2  --   --   --   --   --    Liver Function Tests: Recent Labs  Lab 06/02/20 0622 06/03/20 0523 06/04/20 0537 06/05/20 0421 06/06/20 0404  AST 29 27 27 28  32  ALT 18 17 16 17 23   ALKPHOS 48 55 60 59 64  BILITOT 0.5 0.5 0.8 0.7 0.8  PROT 6.1* 6.2* 6.5 5.8* 6.1*  ALBUMIN 2.8* 2.9* 3.2* 3.0* 3.1*   No results for input(s): LIPASE, AMYLASE in the last 168 hours. No results for input(s): AMMONIA in the last 168 hours. Coagulation Profile: No results for input(s): INR, PROTIME in the last 168 hours. CBC: Recent Labs  Lab 05/31/20 1252 06/06/20 0404  WBC 8.8 15.5*  NEUTROABS 7.0  --   HGB 11.9* 14.0  HCT 36.7 42.6  MCV 88.9 90.3   PLT 240 219   Cardiac Enzymes: No results for input(s): CKTOTAL, CKMB, CKMBINDEX, TROPONINI in the last 168 hours. BNP: Invalid input(s): POCBNP CBG: No results for input(s): GLUCAP in the last 168 hours. HbA1C: No results for input(s): HGBA1C in the last 72 hours. Urine analysis: No results found for: COLORURINE, APPEARANCEUR, LABSPEC, PHURINE, GLUCOSEU, HGBUR, BILIRUBINUR, KETONESUR, PROTEINUR, UROBILINOGEN, NITRITE, LEUKOCYTESUR Sepsis Labs: @LABRCNTIP (procalcitonin:4,lacticidven:4) )No results found for this or any previous visit (from the past 240 hour(s)).   Scheduled Meds: . chlorpheniramine-HYDROcodone  5 mL Oral Q12H  . enoxaparin (LOVENOX) injection  40 mg Subcutaneous Q24H  . Ipratropium-Albuterol  1 puff Inhalation TID  . methylPREDNISolone (SOLU-MEDROL) injection  70 mg Intravenous Q12H  . oxymetazoline  1 spray Each Nare BID   Continuous Infusions:  Procedures/Studies: DG Chest 1 View  Result Date: 06/03/2020 CLINICAL DATA:  COVID 19 pneumonia. EXAM: CHEST  1 VIEW COMPARISON:  CT a chest in chest x-ray dated May 31, 2020. FINDINGS: Unchanged mild cardiomegaly. Diffuse bilateral interstitial and patchy bibasilar airspace opacities  are not significantly changed. No pleural effusion or pneumothorax. No acute osseous abnormality. IMPRESSION: 1. Unchanged multifocal COVID-19 pneumonia. Electronically Signed   By: Obie Dredge M.D.   On: 06/03/2020 12:31   CT Angio Chest PE W/Cm &/Or Wo Cm  Result Date: 05/31/2020 CLINICAL DATA:  Shortness of breath.  COVID-19 positive. EXAM: CT ANGIOGRAPHY CHEST WITH CONTRAST TECHNIQUE: Multidetector CT imaging of the chest was performed using the standard protocol during bolus administration of intravenous contrast. Multiplanar CT image reconstructions and MIPs were obtained to evaluate the vascular anatomy. CONTRAST:  58mL OMNIPAQUE IOHEXOL 350 MG/ML SOLN COMPARISON:  None. FINDINGS: Cardiovascular: Satisfactory opacification of  the pulmonary arteries to the segmental level. No evidence of pulmonary embolism. Mild cardiomegaly. No pericardial effusion. Atherosclerosis of thoracic aorta is noted without aneurysm formation. Mediastinum/Nodes: No enlarged mediastinal, hilar, or axillary lymph nodes. Thyroid gland, trachea, and esophagus demonstrate no significant findings. Lungs/Pleura: No pneumothorax or pleural effusion is noted. Patchy airspace opacities are noted throughout both lungs consistent with multifocal pneumonia. Upper Abdomen: No acute abnormality. Musculoskeletal: No chest wall abnormality. No acute or significant osseous findings. Review of the MIP images confirms the above findings. IMPRESSION: 1. No definite evidence of pulmonary embolus. 2. Patchy airspace opacities are noted throughout both lungs consistent with multifocal pneumonia. 3. Aortic atherosclerosis. Aortic Atherosclerosis (ICD10-I70.0). Electronically Signed   By: Lupita Raider M.D.   On: 05/31/2020 14:32   US Venous Img Lower Bilateral (DVT)  Result Date: 06/03/2020 CLINICAL DATA:  Elevated D-dimer. Former smoker. Patient is currently on anticoagulation. Evaluate for DVT. EXAM: BILATERAL LOWER EXTREMITY VENOUS DOPPLER ULTRASOUND TECHNIQUE: Gray-scale sonography with graded compression, as well as color Doppler and duplex ultrasound were performed to evaluate the lower extremity deep venous systems from the level of the common femoral vein and including the common femoral, femoral, profunda femoral, popliteal and calf veins including the posterior tibial, peroneal and gastrocnemius veins when visible. The superficial great saphenous vein was also interrogated. Spectral Doppler was utilized to evaluate flow at rest and with distal augmentation maneuvers in the common femoral, femoral and popliteal veins. COMPARISON:  None. FINDINGS: RIGHT LOWER EXTREMITY Common Femoral Vein: No evidence of thrombus. Normal compressibility, respiratory phasicity and response  to augmentation. Saphenofemoral Junction: No evidence of thrombus. Normal compressibility and flow on color Doppler imaging. Profunda Femoral Vein: No evidence of thrombus. Normal compressibility and flow on color Doppler imaging. Femoral Vein: No evidence of thrombus. Normal compressibility, respiratory phasicity and response to augmentation. Popliteal Vein: No evidence of thrombus. Normal compressibility, respiratory phasicity and response to augmentation. Calf Veins: No evidence of thrombus. Normal compressibility and flow on color Doppler imaging. Superficial Great Saphenous Vein: No evidence of thrombus. Normal compressibility. Venous Reflux:  None. Other Findings: Note is made of an approximately 2.2 x 1.2 x 1.1 cm anechoic fluid collection within the right popliteal fossa compatible with a Baker's cyst. LEFT LOWER EXTREMITY Common Femoral Vein: No evidence of thrombus. Normal compressibility, respiratory phasicity and response to augmentation. Saphenofemoral Junction: No evidence of thrombus. Normal compressibility and flow on color Doppler imaging. Profunda Femoral Vein: No evidence of thrombus. Normal compressibility and flow on color Doppler imaging. Femoral Vein: No evidence of thrombus. Normal compressibility, respiratory phasicity and response to augmentation. Popliteal Vein: No evidence of thrombus. Normal compressibility, respiratory phasicity and response to augmentation. Calf Veins: No evidence of thrombus. Normal compressibility and flow on color Doppler imaging. Superficial Great Saphenous Vein: No evidence of thrombus. Normal compressibility. Venous Reflux:  None. Other Findings:  None. IMPRESSION: 1. No evidence of DVT within either lower extremity. 2. Incidental note made of an approximately 2.2 cm right-sided Baker's cyst. Electronically Signed   By: Simonne ComeJohn  Watts M.D.   On: 06/03/2020 15:42   DG Chest Port 1 View  Result Date: 05/31/2020 CLINICAL DATA:  Shortness of breath. EXAM: PORTABLE  CHEST 1 VIEW COMPARISON:  March 25, 2009. FINDINGS: The heart size and mediastinal contours are within normal limits. No pneumothorax or pleural effusion is noted. Patchy airspace opacities are noted throughout both lungs consistent with multifocal pneumonia. The visualized skeletal structures are unremarkable. IMPRESSION: Bilateral multifocal pneumonia. Electronically Signed   By: Lupita RaiderJames  Green Jr M.D.   On: 05/31/2020 13:24    Catarina Hartshornavid Tikisha Molinaro, DO  [facility]  If 7PM-7AM, please contact night-coverage [email or web address] Password [email or web address] 06/06/2020, 2:45 PM   LOS: 6 days

## 2020-06-07 DIAGNOSIS — U071 COVID-19: Secondary | ICD-10-CM | POA: Diagnosis not present

## 2020-06-07 DIAGNOSIS — Z7189 Other specified counseling: Secondary | ICD-10-CM | POA: Diagnosis not present

## 2020-06-07 DIAGNOSIS — J9601 Acute respiratory failure with hypoxia: Secondary | ICD-10-CM | POA: Diagnosis not present

## 2020-06-07 DIAGNOSIS — E876 Hypokalemia: Secondary | ICD-10-CM | POA: Diagnosis not present

## 2020-06-07 LAB — COMPREHENSIVE METABOLIC PANEL
ALT: 28 U/L (ref 0–44)
AST: 41 U/L (ref 15–41)
Albumin: 3.1 g/dL — ABNORMAL LOW (ref 3.5–5.0)
Alkaline Phosphatase: 67 U/L (ref 38–126)
Anion gap: 10 (ref 5–15)
BUN: 25 mg/dL — ABNORMAL HIGH (ref 8–23)
CO2: 29 mmol/L (ref 22–32)
Calcium: 8.8 mg/dL — ABNORMAL LOW (ref 8.9–10.3)
Chloride: 97 mmol/L — ABNORMAL LOW (ref 98–111)
Creatinine, Ser: 0.82 mg/dL (ref 0.44–1.00)
GFR, Estimated: >60 mL/min (ref >60)
Glucose, Bld: 150 mg/dL — ABNORMAL HIGH (ref 70–99)
Potassium: 5.4 mmol/L — ABNORMAL HIGH (ref 3.5–5.1)
Sodium: 136 mmol/L (ref 135–145)
Total Bilirubin: 1 mg/dL (ref 0.3–1.2)
Total Protein: 6 g/dL — ABNORMAL LOW (ref 6.5–8.1)

## 2020-06-07 LAB — D-DIMER, QUANTITATIVE: D-Dimer, Quant: >20 ug/mL-FEU — ABNORMAL HIGH (ref 0.00–0.50)

## 2020-06-07 LAB — CBC
HCT: 43.9 % (ref 36.0–46.0)
Hemoglobin: 14.2 g/dL (ref 12.0–15.0)
MCH: 28.6 pg (ref 26.0–34.0)
MCHC: 32.3 g/dL (ref 30.0–36.0)
MCV: 88.3 fL (ref 80.0–100.0)
Platelets: 169 10*3/uL (ref 150–400)
RBC: 4.97 MIL/uL (ref 3.87–5.11)
RDW: 13.2 % (ref 11.5–15.5)
WBC: 18.7 10*3/uL — ABNORMAL HIGH (ref 4.0–10.5)
nRBC: 0 % (ref 0.0–0.2)

## 2020-06-07 LAB — C-REACTIVE PROTEIN: CRP: 0.6 mg/dL (ref <1.0)

## 2020-06-07 LAB — FERRITIN: Ferritin: 752 ng/mL — ABNORMAL HIGH (ref 11–307)

## 2020-06-07 MED ORDER — SODIUM ZIRCONIUM CYCLOSILICATE 10 G PO PACK
10.0000 g | PACK | Freq: Once | ORAL | Status: AC
  Administered 2020-06-07: 10 g via ORAL
  Filled 2020-06-07: qty 1

## 2020-06-07 NOTE — Progress Notes (Addendum)
PROGRESS NOTE  Rachel [name] IYM:[id number] DOB: [birth year] DOA: 05/31/2020 PCP: Tanna Furry, MD  Brief History: [name] will be admitted to the hospital with a working diagnosis of acute hypoxic respiratory failure due to SARS COVID-19 viral pneumonia.  65 year old female unvaccinated for COVID-19 who presents with 7 days of upper respiratory tract infection symptoms that has progressed into dyspnea and cough for the last 48 hours. Poor appetite and generalized weakness. On her initial physical examination her oximetry was 83% on room air, placed on 6 L per nasal cannula reaching 92%,temperature 38.4 C, blood pressure 130/66, 83/68, respiratory rate 28-31, oxygen saturation 95% on 9 L/min per high flow nasal cannula.She has dry mucous membranes, lungs with rales bilaterally but no wheezing, heart S1-S2, present rhythmic, soft abdomen no extremity edema.  Sodium 131, potassium 3.0, chloride 99, bicarb 23, glucose 106, BUN 12, creatinine 0.79, troponin I 15-18, white count 8.8, hemoglobin 0.9, hematocrit 36.7, platelets 240. SARS COVID-19 positive. Chest radiograph with multilobar interstitial infiltrates, all 4 quadrants, predominantly lower lobes, more right than left.  CT chest with no evidence of pulmonary embolism, bilateral multilobar dense groundglass opacities.  Patient with high inflammatory markers and high oxygen requirements initially. tociluzumab 02/19 and started on IV steroids.   Assessment/Plan: Acute hypoxemic respiratory failure due to COVID-19 viral pneumonia Sp tociluzumab 02/19.  Patient is currently on 15 L via high flow nasal cannula and nonrebreather mask -appears to be breathing comfortably and does not feel increasingly short of breath -finished 5 days remdesivir -increased IV solumedrol to 70 mg bid -On bronchodilator therapy, antitussive agents and airway clearing techniques. -Continue to encourage prone as tolerated 4  hrs at the time for a total of 16 hr per day. -She did have elevated D-dimer, but ultrasound of lower extremities and CT chest is negative for VTE. -CRP 21.7>>12.4>>3.5>>2.1>0.9 -Ferritin 1032>>1037>>600>>596>>660>>703 -D-dimer 1.79>.5.68>>3.52>>14.12>>20.00  Hyponatremia and hypokalemia due to dehydration. -due to volume depletion and poor solute intake -Overall electrolytes have corrected and volume status has improved  GERD. -continue protonix  Hx nephrolithiasis.  -No clinical signs of exacerbation  Goals of Care Advance care planning, including the explanation and discussion of advance directives was carried out with the patient and family.  Code status including explanations of "Full Code" and "DNR" and alternatives were discussed in detail.  Discussion of end-of-life issues including but not limited palliative care, hospice care and the concept of hospice, other end-of-life care options, power of attorney for health care decisions, living wills, and physician orders for life-sustaining treatment were also discussed with the patient and family.  Total face to face time 16 minutes. -confirmed DNR with patient initially, but after speaking with spouse, she and spouse changed minds and wanted full scope of care       Status is: Inpatient  Remains inpatient appropriate because:IV treatments appropriate due to intensity of illness or inability to take PO   Dispo: The patient is from:Home Anticipated d/c is HW:[facility] Anticipated d/c date is: > 3 days Patient currently is not medically stable to d/c. Difficult to place patient No        Family Communication:noFamily at bedside  Consultants:none  Code Status: DNR  DVT Prophylaxis: Lovenox   Procedures: As Listed in Progress Note Above  Antibiotics: None     Subjective: Patient states she remains sob with activity,  but overall starting to feel better.  Denies f/c cp, n/v/d, abd pain  Objective: Vitals:  06/07/20 0505 06/07/20 1000 06/07/20 1409 06/07/20 1502  BP: 110/66   120/66  Pulse: 65   67  Resp: 18     Temp: (!) 97.4 F (36.3 C)     TempSrc:      SpO2: 93% 97% 91% 93%  Weight:      Height:        Intake/Output Summary (Last 24 hours) at 06/07/2020 1527 Last data filed at 06/06/2020 1700 Gross per 24 hour  Intake 360 ml  Output -  Net 360 ml   Weight change:  Exam:   General:  Pt is alert, follows commands appropriately, not in acute distress  HEENT: No icterus, No thrush, No neck mass, [place]/AT  Cardiovascular: RRR, S1/S2, no rubs, no gallops  Respiratory: bibasilar rales. No wheeze  Abdomen: Soft/+BS, non tender, non distended, no guarding  Extremities: No edema, No lymphangitis, No petechiae, No rashes, no synovitis   Data Reviewed: I have personally reviewed following labs and imaging studies Basic Metabolic Panel: Recent Labs  Lab 06/01/20 0602 06/02/20 0622 06/03/20 0523 06/04/20 0537 06/05/20 0421 06/06/20 0404 06/07/20 0622  NA 139   < > 139 137 139 138 136  K 5.1   < > 4.4 4.6 4.8 4.7 5.4*  CL 105   < > 104 97* 98 98 97*  CO2 24   < > 24 28 29 27 29   GLUCOSE 153*   < > 146* 131* 142* 157* 150*  BUN 13   < > 26* 27* 27* 27* 25*  CREATININE 0.67   < > 0.84 0.82 0.78 0.74 0.82  CALCIUM 8.9   < > 9.0 9.1 9.3 9.2 8.8*  MG 2.2  --   --   --   --   --   --    < > = values in this interval not displayed.   Liver Function Tests: Recent Labs  Lab 06/03/20 0523 06/04/20 0537 06/05/20 0421 06/06/20 0404 06/07/20 0622  AST 27 27 28  32 41  ALT 17 16 17 23 28   ALKPHOS 55 60 59 64 67  BILITOT 0.5 0.8 0.7 0.8 1.0  PROT 6.2* 6.5 5.8* 6.1* 6.0*  ALBUMIN 2.9* 3.2* 3.0* 3.1* 3.1*   No results for input(s): LIPASE, AMYLASE in the last 168 hours. No results for input(s): AMMONIA in the last 168 hours. Coagulation Profile: No results for input(s): INR, PROTIME  in the last 168 hours. CBC: Recent Labs  Lab 06/06/20 0404 06/07/20 0622  WBC 15.5* 18.7*  HGB 14.0 14.2  HCT 42.6 43.9  MCV 90.3 88.3  PLT 219 169   Cardiac Enzymes: No results for input(s): CKTOTAL, CKMB, CKMBINDEX, TROPONINI in the last 168 hours. BNP: Invalid input(s): POCBNP CBG: No results for input(s): GLUCAP in the last 168 hours. HbA1C: No results for input(s): HGBA1C in the last 72 hours. Urine analysis: No results found for: COLORURINE, APPEARANCEUR, LABSPEC, PHURINE, GLUCOSEU, HGBUR, BILIRUBINUR, KETONESUR, PROTEINUR, UROBILINOGEN, NITRITE, LEUKOCYTESUR Sepsis Labs: @LABRCNTIP (procalcitonin:4,lacticidven:4) )No results found for this or any previous visit (from the past 240 hour(s)).   Scheduled Meds: . chlorpheniramine-HYDROcodone  5 mL Oral Q12H  . enoxaparin (LOVENOX) injection  40 mg Subcutaneous Q24H  . Ipratropium-Albuterol  1 puff Inhalation TID  . methylPREDNISolone (SOLU-MEDROL) injection  70 mg Intravenous Q12H  . oxymetazoline  1 spray Each Nare BID   Continuous Infusions:  Procedures/Studies: DG Chest 1 View  Result Date: 06/03/2020 CLINICAL DATA:  COVID 19 pneumonia. EXAM: CHEST  1 VIEW COMPARISON:  CT  a chest in chest x-ray dated May 31, 2020. FINDINGS: Unchanged mild cardiomegaly. Diffuse bilateral interstitial and patchy bibasilar airspace opacities are not significantly changed. No pleural effusion or pneumothorax. No acute osseous abnormality. IMPRESSION: 1. Unchanged multifocal COVID-19 pneumonia. Electronically Signed   By: Obie DredgeWilliam T Derry M.D.   On: 06/03/2020 12:31   CT Angio Chest PE W/Cm &/Or Wo Cm  Result Date: 05/31/2020 CLINICAL DATA:  Shortness of breath.  COVID-19 positive. EXAM: CT ANGIOGRAPHY CHEST WITH CONTRAST TECHNIQUE: Multidetector CT imaging of the chest was performed using the standard protocol during bolus administration of intravenous contrast. Multiplanar CT image reconstructions and MIPs were obtained to evaluate the  vascular anatomy. CONTRAST:  75mL OMNIPAQUE IOHEXOL 350 MG/ML SOLN COMPARISON:  None. FINDINGS: Cardiovascular: Satisfactory opacification of the pulmonary arteries to the segmental level. No evidence of pulmonary embolism. Mild cardiomegaly. No pericardial effusion. Atherosclerosis of thoracic aorta is noted without aneurysm formation. Mediastinum/Nodes: No enlarged mediastinal, hilar, or axillary lymph nodes. Thyroid gland, trachea, and esophagus demonstrate no significant findings. Lungs/Pleura: No pneumothorax or pleural effusion is noted. Patchy airspace opacities are noted throughout both lungs consistent with multifocal pneumonia. Upper Abdomen: No acute abnormality. Musculoskeletal: No chest wall abnormality. No acute or significant osseous findings. Review of the MIP images confirms the above findings. IMPRESSION: 1. No definite evidence of pulmonary embolus. 2. Patchy airspace opacities are noted throughout both lungs consistent with multifocal pneumonia. 3. Aortic atherosclerosis. Aortic Atherosclerosis (ICD10-I70.0). Electronically Signed   By: Lupita RaiderJames  Green Jr M.D.   On: 05/31/2020 14:32   US Venous Img Lower Bilateral (DVT)  Result Date: 06/03/2020 CLINICAL DATA:  Elevated D-dimer. Former smoker. Patient is currently on anticoagulation. Evaluate for DVT. EXAM: BILATERAL LOWER EXTREMITY VENOUS DOPPLER ULTRASOUND TECHNIQUE: Gray-scale sonography with graded compression, as well as color Doppler and duplex ultrasound were performed to evaluate the lower extremity deep venous systems from the level of the common femoral vein and including the common femoral, femoral, profunda femoral, popliteal and calf veins including the posterior tibial, peroneal and gastrocnemius veins when visible. The superficial great saphenous vein was also interrogated. Spectral Doppler was utilized to evaluate flow at rest and with distal augmentation maneuvers in the common femoral, femoral and popliteal veins. COMPARISON:   None. FINDINGS: RIGHT LOWER EXTREMITY Common Femoral Vein: No evidence of thrombus. Normal compressibility, respiratory phasicity and response to augmentation. Saphenofemoral Junction: No evidence of thrombus. Normal compressibility and flow on color Doppler imaging. Profunda Femoral Vein: No evidence of thrombus. Normal compressibility and flow on color Doppler imaging. Femoral Vein: No evidence of thrombus. Normal compressibility, respiratory phasicity and response to augmentation. Popliteal Vein: No evidence of thrombus. Normal compressibility, respiratory phasicity and response to augmentation. Calf Veins: No evidence of thrombus. Normal compressibility and flow on color Doppler imaging. Superficial Great Saphenous Vein: No evidence of thrombus. Normal compressibility. Venous Reflux:  None. Other Findings: Note is made of an approximately 2.2 x 1.2 x 1.1 cm anechoic fluid collection within the right popliteal fossa compatible with a Baker's cyst. LEFT LOWER EXTREMITY Common Femoral Vein: No evidence of thrombus. Normal compressibility, respiratory phasicity and response to augmentation. Saphenofemoral Junction: No evidence of thrombus. Normal compressibility and flow on color Doppler imaging. Profunda Femoral Vein: No evidence of thrombus. Normal compressibility and flow on color Doppler imaging. Femoral Vein: No evidence of thrombus. Normal compressibility, respiratory phasicity and response to augmentation. Popliteal Vein: No evidence of thrombus. Normal compressibility, respiratory phasicity and response to augmentation. Calf Veins: No evidence of thrombus. Normal compressibility and  flow on color Doppler imaging. Superficial Great Saphenous Vein: No evidence of thrombus. Normal compressibility. Venous Reflux:  None. Other Findings:  None. IMPRESSION: 1. No evidence of DVT within either lower extremity. 2. Incidental note made of an approximately 2.2 cm right-sided Baker's cyst. Electronically Signed   By:  Simonne Come M.D.   On: 06/03/2020 15:42   DG Chest Port 1 View  Result Date: 05/31/2020 CLINICAL DATA:  Shortness of breath. EXAM: PORTABLE CHEST 1 VIEW COMPARISON:  March 25, 2009. FINDINGS: The heart size and mediastinal contours are within normal limits. No pneumothorax or pleural effusion is noted. Patchy airspace opacities are noted throughout both lungs consistent with multifocal pneumonia. The visualized skeletal structures are unremarkable. IMPRESSION: Bilateral multifocal pneumonia. Electronically Signed   By: Lupita Raider M.D.   On: 05/31/2020 13:24    Catarina Hartshorn, DO  [facility]  If 7PM-7AM, please contact night-coverage [email or web address] Password [facility] 06/07/2020, 3:27 PM   LOS: 7 days

## 2020-06-07 NOTE — Plan of Care (Signed)

## 2020-06-08 DIAGNOSIS — E876 Hypokalemia: Secondary | ICD-10-CM | POA: Diagnosis not present

## 2020-06-08 DIAGNOSIS — E871 Hypo-osmolality and hyponatremia: Secondary | ICD-10-CM | POA: Diagnosis not present

## 2020-06-08 DIAGNOSIS — U071 COVID-19: Secondary | ICD-10-CM | POA: Diagnosis not present

## 2020-06-08 DIAGNOSIS — J9601 Acute respiratory failure with hypoxia: Secondary | ICD-10-CM | POA: Diagnosis not present

## 2020-06-08 LAB — COMPREHENSIVE METABOLIC PANEL
ALT: 28 U/L (ref 0–44)
AST: 37 U/L (ref 15–41)
Albumin: 3.3 g/dL — ABNORMAL LOW (ref 3.5–5.0)
Alkaline Phosphatase: 70 U/L (ref 38–126)
Anion gap: 12 (ref 5–15)
BUN: 30 mg/dL — ABNORMAL HIGH (ref 8–23)
CO2: 29 mmol/L (ref 22–32)
Calcium: 9.2 mg/dL (ref 8.9–10.3)
Chloride: 94 mmol/L — ABNORMAL LOW (ref 98–111)
Creatinine, Ser: 0.79 mg/dL (ref 0.44–1.00)
GFR, Estimated: >60 mL/min (ref >60)
Glucose, Bld: 140 mg/dL — ABNORMAL HIGH (ref 70–99)
Potassium: 4.8 mmol/L (ref 3.5–5.1)
Sodium: 135 mmol/L (ref 135–145)
Total Bilirubin: 1 mg/dL (ref 0.3–1.2)
Total Protein: 6.2 g/dL — ABNORMAL LOW (ref 6.5–8.1)

## 2020-06-08 LAB — CBC
HCT: 43.9 % (ref 36.0–46.0)
Hemoglobin: 14.3 g/dL (ref 12.0–15.0)
MCH: 28.9 pg (ref 26.0–34.0)
MCHC: 32.6 g/dL (ref 30.0–36.0)
MCV: 88.9 fL (ref 80.0–100.0)
Platelets: 174 10*3/uL (ref 150–400)
RBC: 4.94 MIL/uL (ref 3.87–5.11)
RDW: 13.6 % (ref 11.5–15.5)
WBC: 18.1 10*3/uL — ABNORMAL HIGH (ref 4.0–10.5)
nRBC: 0 % (ref 0.0–0.2)

## 2020-06-08 LAB — FERRITIN: Ferritin: 773 ng/mL — ABNORMAL HIGH (ref 11–307)

## 2020-06-08 LAB — C-REACTIVE PROTEIN: CRP: 0.6 mg/dL (ref <1.0)

## 2020-06-08 LAB — D-DIMER, QUANTITATIVE: D-Dimer, Quant: 19.13 ug/mL-FEU — ABNORMAL HIGH (ref 0.00–0.50)

## 2020-06-08 NOTE — Plan of Care (Signed)

## 2020-06-08 NOTE — Progress Notes (Signed)
Pt requested NRB O2 be turned down as it was burning her throat. Pt also advised she removed  a large mucus plug from her nasal passage and she was concerned. I educated pt on the humidity added to high flow O2 and the potential for similar effects due to the high O2 from NRB.

## 2020-06-08 NOTE — Progress Notes (Signed)
PROGRESS NOTE  Rachel [name] HAL:[id number] DOB: 1955/12/15 DOA: 05/31/2020 PCP: Tanna Furry, MD   Brief History: Rachel [name] will be admitted to the hospital with a working diagnosis of acute hypoxic respiratory failure due to SARS COVID-19 viral pneumonia.  65 year old female unvaccinated for COVID-19 who presents with 7 days of upper respiratory tract infection symptoms that has progressed into dyspnea and cough for the last 48 hours. Poor appetite and generalized weakness. On her initial physical examination her oximetry was 83% on room air, placed on 6 L per nasal cannula reaching 92%,temperature 38.4 C, blood pressure 130/66, 83/68, respiratory rate 28-31, oxygen saturation 95% on 9 L/min per high flow nasal cannula.She has dry mucous membranes, lungs with rales bilaterally but no wheezing, heart S1-S2, present rhythmic, soft abdomen no extremity edema.  Sodium 131, potassium 3.0, chloride 99, bicarb 23, glucose 106, BUN 12, creatinine 0.79, troponin I 15-18, white count 8.8, hemoglobin 0.9, hematocrit 36.7, platelets 240. SARS COVID-19 positive. Chest radiograph with multilobar interstitial infiltrates, all 4 quadrants, predominantly lower lobes, more right than left.  CT chest with no evidence of pulmonary embolism, bilateral multilobar dense groundglass opacities.  Patient with high inflammatory markers and high oxygen requirementsinitially.tociluzumab 02/19and started on IV steroids.   Assessment/Plan: Acute hypoxemic respiratory failure due to COVID-19 viral pneumonia Sp tociluzumab 02/19. -going from 15HFNC+NRB>>7LHFNC+NRB -appears to be breathing comfortably and does not feel increasingly short of breath -finished 5 days remdesivir -continueIV solumedrol to 70 mg bid -On bronchodilator therapy, antitussive agents and airway clearing techniques. -Continue to encourage prone as tolerated 4 hrs at the time for a total of 16 hr per  day. -She did have elevated D-dimer, but ultrasound of lower extremities and CT chest is negative for VTE. -CRP 21.7>>12.4>>3.5>>2.1>0.9>>0.6 -Ferritin 1032>>1037>>600>>596>>660>>703>>773 -D-dimer 1.79>.5.68>>3.52>>14.12>>20.00>>19.13  Hyponatremia and hypokalemia due to dehydration. -due to volume depletion and poor solute intake -Overall electrolytes have corrected and volume status has improved  GERD. -continue protonix  Hx nephrolithiasis.  -No clinical signs of exacerbation  Goals of Care Advance care planning, including the explanation and discussion of advance directives was carried out with the patient and family. Code status including explanations of "Full Code" and "DNR" and alternatives were discussed in detail. Discussion of end-of-life issues including but not limited palliative care, hospice care and the concept of hospice, other end-of-life care options, power of attorney for health care decisions, living wills, and physician orders for life-sustaining treatment were also discussed with the patient and family. Total face to face time 16 minutes. -confirmed DNR with patient initially, but after speaking with spouse, she and spouse changed minds and wanted full scope of care       Status is: Inpatient  Remains inpatient appropriate because:IV treatments appropriate due to intensity of illness or inability to take PO   Dispo: The patient is from:Home Anticipated d/c is BD:[facility]GD Anticipated d/c date is: > 3 days Patient currently is not medically stable to d/c. Difficult to place patient No        Family Communication:noFamily at bedside  Consultants:none  Code Status:DNR  DVT Prophylaxis:Parks Lovenox   Procedures: As Listed in Progress Note Above  Antibiotics: None     Subjective: Patient has sob with minimal exertion but overall sob is slowly  improving.  Denies f/c, cp, hemoptysis, n/v/d, abd pain  Objective: Vitals:   06/08/20 0500 06/08/20 0605 06/08/20 0744 06/08/20 1340  BP:  (!) 99/59  100/62  Pulse:  73  76  Resp:  20  19  Temp:  98.8 F (37.1 C)  98.8 F (37.1 C)  TempSrc:  Oral  Oral  SpO2:  90% 90% 92%  Weight: 70.9 kg     Height:        Intake/Output Summary (Last 24 hours) at 06/08/2020 1530 Last data filed at 06/08/2020 1300 Gross per 24 hour  Intake 480 ml  Output --  Net 480 ml   Weight change:  Exam:   General:  Pt is alert, follows commands appropriately, not in acute distress  HEENT: No icterus, No thrush, No neck mass, [place]/AT  Cardiovascular: RRR, S1/S2, no rubs, no gallops  Respiratory: bibasilar rales. No wheeze  Abdomen: Soft/+BS, non tender, non distended, no guarding  Extremities: No edema, No lymphangitis, No petechiae, No rashes, no synovitis   Data Reviewed: I have personally reviewed following labs and imaging studies Basic Metabolic Panel: Recent Labs  Lab 06/04/20 0537 06/05/20 0421 06/06/20 0404 06/07/20 0622 06/08/20 0649  NA 137 139 138 136 135  K 4.6 4.8 4.7 5.4* 4.8  CL 97* 98 98 97* 94*  CO2 28 29 27 29 29   GLUCOSE 131* 142* 157* 150* 140*  BUN 27* 27* 27* 25* 30*  CREATININE 0.82 0.78 0.74 0.82 0.79  CALCIUM 9.1 9.3 9.2 8.8* 9.2   Liver Function Tests: Recent Labs  Lab 06/04/20 0537 06/05/20 0421 06/06/20 0404 06/07/20 0622 06/08/20 0649  AST 27 28 32 41 37  ALT 16 17 23 28 28   ALKPHOS 60 59 64 67 70  BILITOT 0.8 0.7 0.8 1.0 1.0  PROT 6.5 5.8* 6.1* 6.0* 6.2*  ALBUMIN 3.2* 3.0* 3.1* 3.1* 3.3*   No results for input(s): LIPASE, AMYLASE in the last 168 hours. No results for input(s): AMMONIA in the last 168 hours. Coagulation Profile: No results for input(s): INR, PROTIME in the last 168 hours. CBC: Recent Labs  Lab 06/06/20 0404 06/07/20 0622 06/08/20 0649  WBC 15.5* 18.7* 18.1*  HGB 14.0 14.2 14.3  HCT 42.6 43.9 43.9  MCV 90.3 88.3 88.9   PLT 219 169 174   Cardiac Enzymes: No results for input(s): CKTOTAL, CKMB, CKMBINDEX, TROPONINI in the last 168 hours. BNP: Invalid input(s): POCBNP CBG: No results for input(s): GLUCAP in the last 168 hours. HbA1C: No results for input(s): HGBA1C in the last 72 hours. Urine analysis: No results found for: COLORURINE, APPEARANCEUR, LABSPEC, PHURINE, GLUCOSEU, HGBUR, BILIRUBINUR, KETONESUR, PROTEINUR, UROBILINOGEN, NITRITE, LEUKOCYTESUR Sepsis Labs: @LABRCNTIP (procalcitonin:4,lacticidven:4) )No results found for this or any previous visit (from the past 240 hour(s)).   Scheduled Meds: . chlorpheniramine-HYDROcodone  5 mL Oral Q12H  . enoxaparin (LOVENOX) injection  40 mg Subcutaneous Q24H  . Ipratropium-Albuterol  1 puff Inhalation TID  . methylPREDNISolone (SOLU-MEDROL) injection  70 mg Intravenous Q12H  . oxymetazoline  1 spray Each Nare BID   Continuous Infusions:  Procedures/Studies: DG Chest 1 View  Result Date: 06/03/2020 CLINICAL DATA:  COVID 19 pneumonia. EXAM: CHEST  1 VIEW COMPARISON:  CT a chest in chest x-ray dated May 31, 2020. FINDINGS: Unchanged mild cardiomegaly. Diffuse bilateral interstitial and patchy bibasilar airspace opacities are not significantly changed. No pleural effusion or pneumothorax. No acute osseous abnormality. IMPRESSION: 1. Unchanged multifocal COVID-19 pneumonia. Electronically Signed   By: Obie DredgeWilliam T Derry M.D.   On: 06/03/2020 12:31   CT Angio Chest PE W/Cm &/Or Wo Cm  Result Date: 05/31/2020 CLINICAL DATA:  Shortness of breath.  COVID-19 positive. EXAM: CT ANGIOGRAPHY CHEST WITH CONTRAST TECHNIQUE: Multidetector  CT imaging of the chest was performed using the standard protocol during bolus administration of intravenous contrast. Multiplanar CT image reconstructions and MIPs were obtained to evaluate the vascular anatomy. CONTRAST:  60mL OMNIPAQUE IOHEXOL 350 MG/ML SOLN COMPARISON:  None. FINDINGS: Cardiovascular: Satisfactory opacification  of the pulmonary arteries to the segmental level. No evidence of pulmonary embolism. Mild cardiomegaly. No pericardial effusion. Atherosclerosis of thoracic aorta is noted without aneurysm formation. Mediastinum/Nodes: No enlarged mediastinal, hilar, or axillary lymph nodes. Thyroid gland, trachea, and esophagus demonstrate no significant findings. Lungs/Pleura: No pneumothorax or pleural effusion is noted. Patchy airspace opacities are noted throughout both lungs consistent with multifocal pneumonia. Upper Abdomen: No acute abnormality. Musculoskeletal: No chest wall abnormality. No acute or significant osseous findings. Review of the MIP images confirms the above findings. IMPRESSION: 1. No definite evidence of pulmonary embolus. 2. Patchy airspace opacities are noted throughout both lungs consistent with multifocal pneumonia. 3. Aortic atherosclerosis. Aortic Atherosclerosis (ICD10-I70.0). Electronically Signed   By: Lupita Raider M.D.   On: 05/31/2020 14:32   US Venous Img Lower Bilateral (DVT)  Result Date: 06/03/2020 CLINICAL DATA:  Elevated D-dimer. Former smoker. Patient is currently on anticoagulation. Evaluate for DVT. EXAM: BILATERAL LOWER EXTREMITY VENOUS DOPPLER ULTRASOUND TECHNIQUE: Gray-scale sonography with graded compression, as well as color Doppler and duplex ultrasound were performed to evaluate the lower extremity deep venous systems from the level of the common femoral vein and including the common femoral, femoral, profunda femoral, popliteal and calf veins including the posterior tibial, peroneal and gastrocnemius veins when visible. The superficial great saphenous vein was also interrogated. Spectral Doppler was utilized to evaluate flow at rest and with distal augmentation maneuvers in the common femoral, femoral and popliteal veins. COMPARISON:  None. FINDINGS: RIGHT LOWER EXTREMITY Common Femoral Vein: No evidence of thrombus. Normal compressibility, respiratory phasicity and  response to augmentation. Saphenofemoral Junction: No evidence of thrombus. Normal compressibility and flow on color Doppler imaging. Profunda Femoral Vein: No evidence of thrombus. Normal compressibility and flow on color Doppler imaging. Femoral Vein: No evidence of thrombus. Normal compressibility, respiratory phasicity and response to augmentation. Popliteal Vein: No evidence of thrombus. Normal compressibility, respiratory phasicity and response to augmentation. Calf Veins: No evidence of thrombus. Normal compressibility and flow on color Doppler imaging. Superficial Great Saphenous Vein: No evidence of thrombus. Normal compressibility. Venous Reflux:  None. Other Findings: Note is made of an approximately 2.2 x 1.2 x 1.1 cm anechoic fluid collection within the right popliteal fossa compatible with a Baker's cyst. LEFT LOWER EXTREMITY Common Femoral Vein: No evidence of thrombus. Normal compressibility, respiratory phasicity and response to augmentation. Saphenofemoral Junction: No evidence of thrombus. Normal compressibility and flow on color Doppler imaging. Profunda Femoral Vein: No evidence of thrombus. Normal compressibility and flow on color Doppler imaging. Femoral Vein: No evidence of thrombus. Normal compressibility, respiratory phasicity and response to augmentation. Popliteal Vein: No evidence of thrombus. Normal compressibility, respiratory phasicity and response to augmentation. Calf Veins: No evidence of thrombus. Normal compressibility and flow on color Doppler imaging. Superficial Great Saphenous Vein: No evidence of thrombus. Normal compressibility. Venous Reflux:  None. Other Findings:  None. IMPRESSION: 1. No evidence of DVT within either lower extremity. 2. Incidental note made of an approximately 2.2 cm right-sided Baker's cyst. Electronically Signed   By: Simonne Come M.D.   On: 06/03/2020 15:42   DG Chest Port 1 View  Result Date: 05/31/2020 CLINICAL DATA:  Shortness of breath. EXAM:  PORTABLE CHEST 1 VIEW COMPARISON:  March 25, 2009. FINDINGS: The heart size and mediastinal contours are within normal limits. No pneumothorax or pleural effusion is noted. Patchy airspace opacities are noted throughout both lungs consistent with multifocal pneumonia. The visualized skeletal structures are unremarkable. IMPRESSION: Bilateral multifocal pneumonia. Electronically Signed   By: Lupita Raider M.D.   On: 05/31/2020 13:24    Catarina Hartshorn, DO  [facility]  If 7PM-7AM, please contact night-coverage [email or web address] Password [email or web address] 06/08/2020, 3:30 PM   LOS: 8 days

## 2020-06-09 DIAGNOSIS — U071 COVID-19: Secondary | ICD-10-CM | POA: Diagnosis not present

## 2020-06-09 DIAGNOSIS — J9601 Acute respiratory failure with hypoxia: Secondary | ICD-10-CM | POA: Diagnosis not present

## 2020-06-09 DIAGNOSIS — E876 Hypokalemia: Secondary | ICD-10-CM | POA: Diagnosis not present

## 2020-06-09 DIAGNOSIS — E871 Hypo-osmolality and hyponatremia: Secondary | ICD-10-CM | POA: Diagnosis not present

## 2020-06-09 LAB — C-REACTIVE PROTEIN: CRP: <0.5 mg/dL (ref <1.0)

## 2020-06-09 LAB — FERRITIN: Ferritin: 824 ng/mL — ABNORMAL HIGH (ref 11–307)

## 2020-06-09 LAB — D-DIMER, QUANTITATIVE: D-Dimer, Quant: 16.49 ug/mL-FEU — ABNORMAL HIGH (ref 0.00–0.50)

## 2020-06-09 MED ORDER — POLYETHYLENE GLYCOL 3350 17 G PO PACK: 17.0000 g | PACK | Freq: Every day | ORAL | Status: DC

## 2020-06-09 MED ORDER — POLYETHYLENE GLYCOL 3350 17 G PO PACK
17.0000 g | PACK | Freq: Two times a day (BID) | ORAL | Status: DC
  Administered 2020-06-09 – 2020-06-12 (×3): 17 g via ORAL
  Filled 2020-06-09 (×7): qty 1

## 2020-06-09 MED ORDER — SENNA 8.6 MG PO TABS
2.0000 | ORAL_TABLET | Freq: Every day | ORAL | Status: DC
  Administered 2020-06-09 – 2020-06-15 (×4): 17.2 mg via ORAL
  Filled 2020-06-09 (×6): qty 2

## 2020-06-09 MED ORDER — POLYETHYLENE GLYCOL 3350 17 G PO PACK
17.0000 g | PACK | Freq: Every day | ORAL | Status: DC
  Administered 2020-06-09: 17 g via ORAL
  Filled 2020-06-09: qty 1

## 2020-06-09 NOTE — Progress Notes (Signed)
MD notified  Of pt request for medication to help with constipation. Pt stated no bowel movement in a week

## 2020-06-09 NOTE — Progress Notes (Signed)
PROGRESS NOTE  Rachel [name] ZOX:[id number] DOB: 04/04/1956 DOA: 05/31/2020 PCP: Tanna Furry, MD  Brief History: Rachel [name] will be admitted to the hospital with a working diagnosis of acute hypoxic respiratory failure due to SARS COVID-19 viral pneumonia.  65 year old female unvaccinated for COVID-19 who presents with 7 days of upper respiratory tract infection symptoms that has progressed into dyspnea and cough for the last 48 hours. Poor appetite and generalized weakness. On her initial physical examination her oximetry was 83% on room air, placed on 6 L per nasal cannula reaching 92%,temperature 38.4 C, blood pressure 130/66, 83/68, respiratory rate 28-31, oxygen saturation 95% on 9 L/min per high flow nasal cannula.She has dry mucous membranes, lungs with rales bilaterally but no wheezing, heart S1-S2, present rhythmic, soft abdomen no extremity edema.  Sodium 131, potassium 3.0, chloride 99, bicarb 23, glucose 106, BUN 12, creatinine 0.79, troponin I 15-18, white count 8.8, hemoglobin 0.9, hematocrit 36.7, platelets 240. SARS COVID-19 positive. Chest radiograph with multilobar interstitial infiltrates, all 4 quadrants, predominantly lower lobes, more right than left.  CT chest with no evidence of pulmonary embolism, bilateral multilobar dense groundglass opacities.  Patient with high inflammatory markers and high oxygen requirementsinitially.tociluzumab 02/19and started on IV steroids.   Assessment/Plan: Acute hypoxemic respiratory failure due to COVID-19 viral pneumonia Sp tociluzumab 02/19. -going from 15HFNC+NRB>>4LHFNC+NRB with saturation 90-91% -appears to be breathing comfortably and does not feel increasingly short of breath -finished 5 days remdesivir -continueIV solumedrol to 70 mg bid -On bronchodilator therapy, antitussive agents and airway clearing techniques. -Continue to encourage prone as tolerated 4 hrs at the time for a  total of 16 hr per day. -She did have elevated D-dimer, but ultrasound of lower extremities and CT chest is negative for VTE. -CRP 21.7>>12.4>>3.5>>2.1>0.9>>0.6--><0.5 -Ferritin 1032>>1037>>600>>596>>660>>703>>773>>824 -D-dimer 1.79>.5.68>>3.52>>14.12>>20.00>>19.13>>16.49  Hyponatremia and hypokalemia due to dehydration. -due to volume depletion and poor solute intake -Overall electrolytes have corrected and volume status has improved  GERD. -continue protonix  Hx nephrolithiasis.  -No clinical signs of exacerbation  Goals of Care Advance care planning, including the explanation and discussion of advance directives was carried out with the patient and family. Code status including explanations of "Full Code" and "DNR" and alternatives were discussed in detail. Discussion of end-of-life issues including but not limited palliative care, hospice care and the concept of hospice, other end-of-life care options, power of attorney for health care decisions, living wills, and physician orders for life-sustaining treatment were also discussed with the patient and family. Total face to face time 16 minutes. -confirmed DNR with patientinitially, but after speaking with spouse, she and spouse changed minds and wanted full scope of care       Status is: Inpatient  Remains inpatient appropriate because:IV treatments appropriate due to intensity of illness or inability to take PO   Dispo: The patient is from:Home Anticipated d/c is WJ:XBJY Anticipated d/c date is: > 3 days Patient currently is not medically stable to d/c. Difficult to place patient No        Family Communication:noFamily at bedside  Consultants:none  Code Status:DNR  DVT Prophylaxis:[place] Lovenox   Procedures: As Listed in Progress Note Above  Antibiotics: None     Subjective: Patient had NRB off with  saturation in low 60s.  Has sob with minimal activity, but overall slowly improving.  Denies f/c, cp, n/v/d, abd pain  Objective: Vitals:   06/09/20 0700 06/09/20 0801 06/09/20 1358 06/09/20 1515  BP: 100/60  113/75   Pulse: 74  94   Resp: 16     Temp: 98.8 F (37.1 C)  98.1 F (36.7 C)   TempSrc: Oral  Oral   SpO2: 92% 90% 91% (!) 84%  Weight:      Height:        Intake/Output Summary (Last 24 hours) at 06/09/2020 1711 Last data filed at 06/09/2020 0700 Gross per 24 hour  Intake 720 ml  Output --  Net 720 ml   Weight change:  Exam:   General:  Pt is alert, follows commands appropriately, not in acute distress  HEENT: No icterus, No thrush, No neck mass, [place]/AT  Cardiovascular: RRR, S1/S2, no rubs, no gallops  Respiratory: bibasilar rales. No wheeze  Abdomen: Soft/+BS, non tender, non distended, no guarding  Extremities: No edema, No lymphangitis, No petechiae, No rashes, no synovitis   Data Reviewed: I have personally reviewed following labs and imaging studies Basic Metabolic Panel: Recent Labs  Lab 06/04/20 0537 06/05/20 0421 06/06/20 0404 06/07/20 0622 06/08/20 0649  NA 137 139 138 136 135  K 4.6 4.8 4.7 5.4* 4.8  CL 97* 98 98 97* 94*  CO2 28 29 27 29 29   GLUCOSE 131* 142* 157* 150* 140*  BUN 27* 27* 27* 25* 30*  CREATININE 0.82 0.78 0.74 0.82 0.79  CALCIUM 9.1 9.3 9.2 8.8* 9.2   Liver Function Tests: Recent Labs  Lab 06/04/20 0537 06/05/20 0421 06/06/20 0404 06/07/20 0622 06/08/20 0649  AST 27 28 32 41 37  ALT 16 17 23 28 28   ALKPHOS 60 59 64 67 70  BILITOT 0.8 0.7 0.8 1.0 1.0  PROT 6.5 5.8* 6.1* 6.0* 6.2*  ALBUMIN 3.2* 3.0* 3.1* 3.1* 3.3*   No results for input(s): LIPASE, AMYLASE in the last 168 hours. No results for input(s): AMMONIA in the last 168 hours. Coagulation Profile: No results for input(s): INR, PROTIME in the last 168 hours. CBC: Recent Labs  Lab 06/06/20 0404 06/07/20 0622 06/08/20 0649  WBC 15.5* 18.7* 18.1*  HGB  14.0 14.2 14.3  HCT 42.6 43.9 43.9  MCV 90.3 88.3 88.9  PLT 219 169 174   Cardiac Enzymes: No results for input(s): CKTOTAL, CKMB, CKMBINDEX, TROPONINI in the last 168 hours. BNP: Invalid input(s): POCBNP CBG: No results for input(s): GLUCAP in the last 168 hours. HbA1C: No results for input(s): HGBA1C in the last 72 hours. Urine analysis: No results found for: COLORURINE, APPEARANCEUR, LABSPEC, PHURINE, GLUCOSEU, HGBUR, BILIRUBINUR, KETONESUR, PROTEINUR, UROBILINOGEN, NITRITE, LEUKOCYTESUR Sepsis Labs: @LABRCNTIP (procalcitonin:4,lacticidven:4) )No results found for this or any previous visit (from the past 240 hour(s)).   Scheduled Meds: . chlorpheniramine-HYDROcodone  5 mL Oral Q12H  . enoxaparin (LOVENOX) injection  40 mg Subcutaneous Q24H  . Ipratropium-Albuterol  1 puff Inhalation TID  . methylPREDNISolone (SOLU-MEDROL) injection  70 mg Intravenous Q12H  . oxymetazoline  1 spray Each Nare BID  . polyethylene glycol  17 g Oral Daily  . senna  2 tablet Oral Daily   Continuous Infusions:  Procedures/Studies: DG Chest 1 View  Result Date: 06/03/2020 CLINICAL DATA:  COVID 19 pneumonia. EXAM: CHEST  1 VIEW COMPARISON:  CT a chest in chest x-ray dated May 31, 2020. FINDINGS: Unchanged mild cardiomegaly. Diffuse bilateral interstitial and patchy bibasilar airspace opacities are not significantly changed. No pleural effusion or pneumothorax. No acute osseous abnormality. IMPRESSION: 1. Unchanged multifocal COVID-19 pneumonia. Electronically Signed   By: M.D.   On: 06/03/2020 12:31   CT Angio Chest PE W/Cm &/  Or Wo Cm  Result Date: 05/31/2020 CLINICAL DATA:  Shortness of breath.  COVID-19 positive. EXAM: CT ANGIOGRAPHY CHEST WITH CONTRAST TECHNIQUE: Multidetector CT imaging of the chest was performed using the standard protocol during bolus administration of intravenous contrast. Multiplanar CT image reconstructions and MIPs were obtained to evaluate the vascular  anatomy. CONTRAST:  75mL OMNIPAQUE IOHEXOL 350 MG/ML SOLN COMPARISON:  None. FINDINGS: Cardiovascular: Satisfactory opacification of the pulmonary arteries to the segmental level. No evidence of pulmonary embolism. Mild cardiomegaly. No pericardial effusion. Atherosclerosis of thoracic aorta is noted without aneurysm formation. Mediastinum/Nodes: No enlarged mediastinal, hilar, or axillary lymph nodes. Thyroid gland, trachea, and esophagus demonstrate no significant findings. Lungs/Pleura: No pneumothorax or pleural effusion is noted. Patchy airspace opacities are noted throughout both lungs consistent with multifocal pneumonia. Upper Abdomen: No acute abnormality. Musculoskeletal: No chest wall abnormality. No acute or significant osseous findings. Review of the MIP images confirms the above findings. IMPRESSION: 1. No definite evidence of pulmonary embolus. 2. Patchy airspace opacities are noted throughout both lungs consistent with multifocal pneumonia. 3. Aortic atherosclerosis. Aortic Atherosclerosis (ICD10-I70.0). Electronically Signed   By: Lupita RaiderJames  Green Jr M.D.   On: 05/31/2020 14:32   US Venous Img Lower Bilateral (DVT)  Result Date: 06/03/2020 CLINICAL DATA:  Elevated D-dimer. Former smoker. Patient is currently on anticoagulation. Evaluate for DVT. EXAM: BILATERAL LOWER EXTREMITY VENOUS DOPPLER ULTRASOUND TECHNIQUE: Gray-scale sonography with graded compression, as well as color Doppler and duplex ultrasound were performed to evaluate the lower extremity deep venous systems from the level of the common femoral vein and including the common femoral, femoral, profunda femoral, popliteal and calf veins including the posterior tibial, peroneal and gastrocnemius veins when visible. The superficial great saphenous vein was also interrogated. Spectral Doppler was utilized to evaluate flow at rest and with distal augmentation maneuvers in the common femoral, femoral and popliteal veins. COMPARISON:  None.  FINDINGS: RIGHT LOWER EXTREMITY Common Femoral Vein: No evidence of thrombus. Normal compressibility, respiratory phasicity and response to augmentation. Saphenofemoral Junction: No evidence of thrombus. Normal compressibility and flow on color Doppler imaging. Profunda Femoral Vein: No evidence of thrombus. Normal compressibility and flow on color Doppler imaging. Femoral Vein: No evidence of thrombus. Normal compressibility, respiratory phasicity and response to augmentation. Popliteal Vein: No evidence of thrombus. Normal compressibility, respiratory phasicity and response to augmentation. Calf Veins: No evidence of thrombus. Normal compressibility and flow on color Doppler imaging. Superficial Great Saphenous Vein: No evidence of thrombus. Normal compressibility. Venous Reflux:  None. Other Findings: Note is made of an approximately 2.2 x 1.2 x 1.1 cm anechoic fluid collection within the right popliteal fossa compatible with a Baker's cyst. LEFT LOWER EXTREMITY Common Femoral Vein: No evidence of thrombus. Normal compressibility, respiratory phasicity and response to augmentation. Saphenofemoral Junction: No evidence of thrombus. Normal compressibility and flow on color Doppler imaging. Profunda Femoral Vein: No evidence of thrombus. Normal compressibility and flow on color Doppler imaging. Femoral Vein: No evidence of thrombus. Normal compressibility, respiratory phasicity and response to augmentation. Popliteal Vein: No evidence of thrombus. Normal compressibility, respiratory phasicity and response to augmentation. Calf Veins: No evidence of thrombus. Normal compressibility and flow on color Doppler imaging. Superficial Great Saphenous Vein: No evidence of thrombus. Normal compressibility. Venous Reflux:  None. Other Findings:  None. IMPRESSION: 1. No evidence of DVT within either lower extremity. 2. Incidental note made of an approximately 2.2 cm right-sided Baker's cyst. Electronically Signed   By: Simonne ComeJohn   Watts M.D.   On: 06/03/2020 15:42  DG Chest Port 1 View  Result Date: 05/31/2020 CLINICAL DATA:  Shortness of breath. EXAM: PORTABLE CHEST 1 VIEW COMPARISON:  March 25, 2009. FINDINGS: The heart size and mediastinal contours are within normal limits. No pneumothorax or pleural effusion is noted. Patchy airspace opacities are noted throughout both lungs consistent with multifocal pneumonia. The visualized skeletal structures are unremarkable. IMPRESSION: Bilateral multifocal pneumonia. Electronically Signed   By: Lupita Raider M.D.   On: 05/31/2020 13:24    Catarina Hartshorn, DO  [facility]  If 7PM-7AM, please contact night-coverage [email or web address] Password [email or web address] 06/09/2020, 5:11 PM   LOS: 9 days

## 2020-06-09 NOTE — Progress Notes (Signed)
Pt ambulated to bedside commode and O2 dropped to 50-60%. Increased oxygen  to 8L HFNC. Pt tolerated well, no signs of distress.

## 2020-06-10 DIAGNOSIS — E876 Hypokalemia: Secondary | ICD-10-CM | POA: Diagnosis not present

## 2020-06-10 DIAGNOSIS — J9601 Acute respiratory failure with hypoxia: Secondary | ICD-10-CM | POA: Diagnosis not present

## 2020-06-10 DIAGNOSIS — E871 Hypo-osmolality and hyponatremia: Secondary | ICD-10-CM | POA: Diagnosis not present

## 2020-06-10 DIAGNOSIS — U071 COVID-19: Secondary | ICD-10-CM | POA: Diagnosis not present

## 2020-06-10 LAB — COMPREHENSIVE METABOLIC PANEL
ALT: 24 U/L (ref 0–44)
AST: 26 U/L (ref 15–41)
Albumin: 3 g/dL — ABNORMAL LOW (ref 3.5–5.0)
Alkaline Phosphatase: 63 U/L (ref 38–126)
Anion gap: 11 (ref 5–15)
BUN: 26 mg/dL — ABNORMAL HIGH (ref 8–23)
CO2: 29 mmol/L (ref 22–32)
Calcium: 8.7 mg/dL — ABNORMAL LOW (ref 8.9–10.3)
Chloride: 94 mmol/L — ABNORMAL LOW (ref 98–111)
Creatinine, Ser: 0.71 mg/dL (ref 0.44–1.00)
GFR, Estimated: >60 mL/min (ref >60)
Glucose, Bld: 175 mg/dL — ABNORMAL HIGH (ref 70–99)
Potassium: 5.2 mmol/L — ABNORMAL HIGH (ref 3.5–5.1)
Sodium: 134 mmol/L — ABNORMAL LOW (ref 135–145)
Total Bilirubin: 0.7 mg/dL (ref 0.3–1.2)
Total Protein: 5.5 g/dL — ABNORMAL LOW (ref 6.5–8.1)

## 2020-06-10 LAB — CBC
HCT: 41.1 % (ref 36.0–46.0)
Hemoglobin: 13.3 g/dL (ref 12.0–15.0)
MCH: 28.9 pg (ref 26.0–34.0)
MCHC: 32.4 g/dL (ref 30.0–36.0)
MCV: 89.3 fL (ref 80.0–100.0)
Platelets: 131 10*3/uL — ABNORMAL LOW (ref 150–400)
RBC: 4.6 MIL/uL (ref 3.87–5.11)
RDW: 13.6 % (ref 11.5–15.5)
WBC: 15 10*3/uL — ABNORMAL HIGH (ref 4.0–10.5)
nRBC: 0 % (ref 0.0–0.2)

## 2020-06-10 LAB — C-REACTIVE PROTEIN: CRP: 1 mg/dL — ABNORMAL HIGH (ref <1.0)

## 2020-06-10 LAB — FERRITIN: Ferritin: 808 ng/mL — ABNORMAL HIGH (ref 11–307)

## 2020-06-10 LAB — D-DIMER, QUANTITATIVE: D-Dimer, Quant: 12.27 ug/mL-FEU — ABNORMAL HIGH (ref 0.00–0.50)

## 2020-06-10 MED ORDER — PANTOPRAZOLE SODIUM 40 MG PO TBEC
40.0000 mg | DELAYED_RELEASE_TABLET | Freq: Every day | ORAL | Status: DC
  Administered 2020-06-10 – 2020-06-13 (×4): 40 mg via ORAL
  Filled 2020-06-10 (×4): qty 1

## 2020-06-10 MED ORDER — CALCIUM CARBONATE ANTACID 500 MG PO CHEW
2.0000 | CHEWABLE_TABLET | Freq: Once | ORAL | Status: AC
  Administered 2020-06-10: 400 mg via ORAL
  Filled 2020-06-10: qty 2

## 2020-06-10 MED ORDER — SODIUM ZIRCONIUM CYCLOSILICATE 10 G PO PACK
10.0000 g | PACK | Freq: Once | ORAL | Status: AC
  Administered 2020-06-10: 10 g via ORAL
  Filled 2020-06-10: qty 1

## 2020-06-10 NOTE — Progress Notes (Signed)
PROGRESS NOTE  [name] AOZ:[id number] DOB: 05-31-1955 DOA: 05/31/2020 PCP: Tanna Furry, MD  Brief History: Rachel [name] will be admitted to the hospital with a working diagnosis of acute hypoxic respiratory failure due to SARS COVID-19 viral pneumonia.  65 year old female unvaccinated for COVID-19 who presents with 7 days of upper respiratory tract infection symptoms that has progressed into dyspnea and cough for the last 48 hours. Poor appetite and generalized weakness. On her initial physical examination her oximetry was 83% on room air, placed on 6 L per nasal cannula reaching 92%,temperature 38.4 C, blood pressure 130/66, 83/68, respiratory rate 28-31, oxygen saturation 95% on 9 L/min per high flow nasal cannula.She has dry mucous membranes, lungs with rales bilaterally but no wheezing, heart S1-S2, present rhythmic, soft abdomen no extremity edema.  Sodium 131, potassium 3.0, chloride 99, bicarb 23, glucose 106, BUN 12, creatinine 0.79, troponin I 15-18, white count 8.8, hemoglobin 0.9, hematocrit 36.7, platelets 240. SARS COVID-19 positive. Chest radiograph with multilobar interstitial infiltrates, all 4 quadrants, predominantly lower lobes, more right than left.  CT chest with no evidence of pulmonary embolism, bilateral multilobar dense groundglass opacities.  Patient with high inflammatory markers and high oxygen requirementsinitially.tociluzumab 02/19and started on IV steroids.   Assessment/Plan: Acute hypoxemic respiratory failure due to COVID-19 viral pneumonia Sp tociluzumab 02/19. -going from 15HFNC+NRB>>4LHFNC+NRB>>12LHFNC -finished 5 days remdesivir -continueIV solumedrol to 70 mg bid -On bronchodilator therapy, antitussive agents and airway clearing techniques. -Continue to encourage prone as tolerated 4 hrs at the time for a total of 16 hr per day. -She did have elevated D-dimer, but ultrasound of lower extremities and CT  chest is negative for VTE. -CRP 21.7>>12.4>>3.5>>2.1>0.9>>0.6--><0.5 -Ferritin 1032>>1037>>600>>596>>660>>703>>773>>824 -D-dimer 1.79>.5.68>>3.52>>14.12>>20.00>>19.13>>16.49  Hyponatremia and hypokalemia due to dehydration. -due to volume depletion and poor solute intake -Overall electrolytes have corrected and volume status has improved  GERD. -continue protonix  Hx nephrolithiasis.  -No clinical signs of exacerbation  Goals of Care Advance care planning, including the explanation and discussion of advance directives was carried out with the patient and family. Code status including explanations of "Full Code" and "DNR" and alternatives were discussed in detail. Discussion of end-of-life issues including but not limited palliative care, hospice care and the concept of hospice, other end-of-life care options, power of attorney for health care decisions, living wills, and physician orders for life-sustaining treatment were also discussed with the patient and family. Total face to face time 16 minutes. -confirmed DNR with patientinitially, but after speaking with spouse, she and spouse changed minds and wanted full scope of care       Status is: Inpatient  Remains inpatient appropriate because:IV treatments appropriate due to intensity of illness or inability to take PO   Dispo: The patient is from:Home Anticipated d/c is NG:EXBM Anticipated d/c date is: > 3 days Patient currently is not medically stable to d/c. Difficult to place patient No        Family Communication:spouse updated 2/28  Consultants:none  Code Status:DNR  DVT Prophylaxis:[place] Lovenox   Procedures: As Listed in Progress Note Above  Antibiotics: None     Subjective: Patient denies fevers, chills, headache, chest pain,  nausea, vomiting, diarrhea, abdominal pain, dysuria, hematuria, hematochezia, and  melena. She has dyspnea with exertion.  She had one episode of hemoptysis last night  Objective: Vitals:   06/09/20 2012 06/10/20 0600 06/10/20 0732 06/10/20 1434  BP: 113/69 118/64  114/72  Pulse: 76 60  83  Resp:  18 20  20   Temp: (!) 97.5 F (36.4 C) (!) 97.3 F (36.3 C)  98.8 F (37.1 C)  TempSrc: Oral Oral  Oral  SpO2: 90% 96% 93% 97%  Weight:      Height:        Intake/Output Summary (Last 24 hours) at 06/10/2020 1614 Last data filed at 06/09/2020 2009 Gross per 24 hour  Intake 440 ml  Output -  Net 440 ml   Weight change:  Exam:   General:  Pt is alert, follows commands appropriately, not in acute distress  HEENT: No icterus, No thrush, No neck mass, [place]/AT  Cardiovascular: RRR, S1/S2, no rubs, no gallops  Respiratory: bibasilar rales. No wheezew  Abdomen: Soft/+BS, non tender, non distended, no guarding  Extremities: No edema, No lymphangitis, No petechiae, No rashes, no synovitis   Data Reviewed: I have personally reviewed following labs and imaging studies Basic Metabolic Panel: Recent Labs  Lab 06/05/20 0421 06/06/20 0404 06/07/20 0622 06/08/20 0649 06/10/20 0603  NA 139 138 136 135 134*  K 4.8 4.7 5.4* 4.8 5.2*  CL 98 98 97* 94* 94*  CO2 29 27 29 29 29   GLUCOSE 142* 157* 150* 140* 175*  BUN 27* 27* 25* 30* 26*  CREATININE 0.78 0.74 0.82 0.79 0.71  CALCIUM 9.3 9.2 8.8* 9.2 8.7*   Liver Function Tests: Recent Labs  Lab 06/05/20 0421 06/06/20 0404 06/07/20 0622 06/08/20 0649 06/10/20 0603  AST 28 32 41 37 26  ALT 17 23 28 28 24   ALKPHOS 59 64 67 70 63  BILITOT 0.7 0.8 1.0 1.0 0.7  PROT 5.8* 6.1* 6.0* 6.2* 5.5*  ALBUMIN 3.0* 3.1* 3.1* 3.3* 3.0*   No results for input(s): LIPASE, AMYLASE in the last 168 hours. No results for input(s): AMMONIA in the last 168 hours. Coagulation Profile: No results for input(s): INR, PROTIME in the last 168 hours. CBC: Recent Labs  Lab 06/06/20 0404 06/07/20 0622 06/08/20 0649 06/10/20 0603  WBC  15.5* 18.7* 18.1* 15.0*  HGB 14.0 14.2 14.3 13.3  HCT 42.6 43.9 43.9 41.1  MCV 90.3 88.3 88.9 89.3  PLT 219 169 174 131*   Cardiac Enzymes: No results for input(s): CKTOTAL, CKMB, CKMBINDEX, TROPONINI in the last 168 hours. BNP: Invalid input(s): POCBNP CBG: No results for input(s): GLUCAP in the last 168 hours. HbA1C: No results for input(s): HGBA1C in the last 72 hours. Urine analysis: No results found for: COLORURINE, APPEARANCEUR, LABSPEC, PHURINE, GLUCOSEU, HGBUR, BILIRUBINUR, KETONESUR, PROTEINUR, UROBILINOGEN, NITRITE, LEUKOCYTESUR Sepsis Labs: @LABRCNTIP (procalcitonin:4,lacticidven:4) )No results found for this or any previous visit (from the past 240 hour(s)).   Scheduled Meds: . chlorpheniramine-HYDROcodone  5 mL Oral Q12H  . enoxaparin (LOVENOX) injection  40 mg Subcutaneous Q24H  . Ipratropium-Albuterol  1 puff Inhalation TID  . methylPREDNISolone (SOLU-MEDROL) injection  70 mg Intravenous Q12H  . oxymetazoline  1 spray Each Nare BID  . polyethylene glycol  17 g Oral BID  . senna  2 tablet Oral Daily   Continuous Infusions:  Procedures/Studies: DG Chest 1 View  Result Date: 06/03/2020 CLINICAL DATA:  COVID 19 pneumonia. EXAM: CHEST  1 VIEW COMPARISON:  CT a chest in chest x-ray dated May 31, 2020. FINDINGS: Unchanged mild cardiomegaly. Diffuse bilateral interstitial and patchy bibasilar airspace opacities are not significantly changed. No pleural effusion or pneumothorax. No acute osseous abnormality. IMPRESSION: 1. Unchanged multifocal COVID-19 pneumonia. Electronically Signed   By: 06/12/20 M.D.   On: 06/03/2020 12:31   CT Angio Chest PE  W/Cm &/Or Wo Cm  Result Date: 05/31/2020 CLINICAL DATA:  Shortness of breath.  COVID-19 positive. EXAM: CT ANGIOGRAPHY CHEST WITH CONTRAST TECHNIQUE: Multidetector CT imaging of the chest was performed using the standard protocol during bolus administration of intravenous contrast. Multiplanar CT image reconstructions  and MIPs were obtained to evaluate the vascular anatomy. CONTRAST:  75mL OMNIPAQUE IOHEXOL 350 MG/ML SOLN COMPARISON:  None. FINDINGS: Cardiovascular: Satisfactory opacification of the pulmonary arteries to the segmental level. No evidence of pulmonary embolism. Mild cardiomegaly. No pericardial effusion. Atherosclerosis of thoracic aorta is noted without aneurysm formation. Mediastinum/Nodes: No enlarged mediastinal, hilar, or axillary lymph nodes. Thyroid gland, trachea, and esophagus demonstrate no significant findings. Lungs/Pleura: No pneumothorax or pleural effusion is noted. Patchy airspace opacities are noted throughout both lungs consistent with multifocal pneumonia. Upper Abdomen: No acute abnormality. Musculoskeletal: No chest wall abnormality. No acute or significant osseous findings. Review of the MIP images confirms the above findings. IMPRESSION: 1. No definite evidence of pulmonary embolus. 2. Patchy airspace opacities are noted throughout both lungs consistent with multifocal pneumonia. 3. Aortic atherosclerosis. Aortic Atherosclerosis (ICD10-I70.0). Electronically Signed   By: Lupita RaiderJames  Green Jr M.D.   On: 05/31/2020 14:32   US Venous Img Lower Bilateral (DVT)  Result Date: 06/03/2020 CLINICAL DATA:  Elevated D-dimer. Former smoker. Patient is currently on anticoagulation. Evaluate for DVT. EXAM: BILATERAL LOWER EXTREMITY VENOUS DOPPLER ULTRASOUND TECHNIQUE: Gray-scale sonography with graded compression, as well as color Doppler and duplex ultrasound were performed to evaluate the lower extremity deep venous systems from the level of the common femoral vein and including the common femoral, femoral, profunda femoral, popliteal and calf veins including the posterior tibial, peroneal and gastrocnemius veins when visible. The superficial great saphenous vein was also interrogated. Spectral Doppler was utilized to evaluate flow at rest and with distal augmentation maneuvers in the common femoral,  femoral and popliteal veins. COMPARISON:  None. FINDINGS: RIGHT LOWER EXTREMITY Common Femoral Vein: No evidence of thrombus. Normal compressibility, respiratory phasicity and response to augmentation. Saphenofemoral Junction: No evidence of thrombus. Normal compressibility and flow on color Doppler imaging. Profunda Femoral Vein: No evidence of thrombus. Normal compressibility and flow on color Doppler imaging. Femoral Vein: No evidence of thrombus. Normal compressibility, respiratory phasicity and response to augmentation. Popliteal Vein: No evidence of thrombus. Normal compressibility, respiratory phasicity and response to augmentation. Calf Veins: No evidence of thrombus. Normal compressibility and flow on color Doppler imaging. Superficial Great Saphenous Vein: No evidence of thrombus. Normal compressibility. Venous Reflux:  None. Other Findings: Note is made of an approximately 2.2 x 1.2 x 1.1 cm anechoic fluid collection within the right popliteal fossa compatible with a Baker's cyst. LEFT LOWER EXTREMITY Common Femoral Vein: No evidence of thrombus. Normal compressibility, respiratory phasicity and response to augmentation. Saphenofemoral Junction: No evidence of thrombus. Normal compressibility and flow on color Doppler imaging. Profunda Femoral Vein: No evidence of thrombus. Normal compressibility and flow on color Doppler imaging. Femoral Vein: No evidence of thrombus. Normal compressibility, respiratory phasicity and response to augmentation. Popliteal Vein: No evidence of thrombus. Normal compressibility, respiratory phasicity and response to augmentation. Calf Veins: No evidence of thrombus. Normal compressibility and flow on color Doppler imaging. Superficial Great Saphenous Vein: No evidence of thrombus. Normal compressibility. Venous Reflux:  None. Other Findings:  None. IMPRESSION: 1. No evidence of DVT within either lower extremity. 2. Incidental note made of an approximately 2.2 cm right-sided  Baker's cyst. Electronically Signed   By: Simonne ComeJohn  Watts M.D.   On: 06/03/2020 15:42  DG Chest Port 1 View  Result Date: 05/31/2020 CLINICAL DATA:  Shortness of breath. EXAM: PORTABLE CHEST 1 VIEW COMPARISON:  March 25, 2009. FINDINGS: The heart size and mediastinal contours are within normal limits. No pneumothorax or pleural effusion is noted. Patchy airspace opacities are noted throughout both lungs consistent with multifocal pneumonia. The visualized skeletal structures are unremarkable. IMPRESSION: Bilateral multifocal pneumonia. Electronically Signed   By: Lupita Raider M.D.   On: 05/31/2020 13:24    Catarina Hartshorn, DO  [facility]  If 7PM-7AM, please contact night-coverage [email or web address] Password [email or web address] 06/10/2020, 4:14 PM   LOS: 10 days

## 2020-06-10 NOTE — Plan of Care (Signed)
  Problem: Clinical Measurements: Goal: Will remain free from infection Outcome: Progressing Goal: Respiratory complications will improve Outcome: Progressing Goal: Cardiovascular complication will be avoided Outcome: Progressing   Problem: Activity: Goal: Risk for activity intolerance will decrease Outcome: Progressing   Problem: Safety: Goal: Ability to remain free from injury will improve Outcome: Progressing   Problem: Skin Integrity: Goal: Risk for impaired skin integrity will decrease Outcome: Progressing

## 2020-06-10 NOTE — Progress Notes (Signed)
Patient stated she was lying supine and belched. When she belched it caused her to cough and she coughed up a large amount of bloody phlegm x 1 episode. MD made aware via amion chat with orders to monitor patient.

## 2020-06-11 DIAGNOSIS — U071 COVID-19: Secondary | ICD-10-CM | POA: Diagnosis not present

## 2020-06-11 DIAGNOSIS — J9601 Acute respiratory failure with hypoxia: Secondary | ICD-10-CM | POA: Diagnosis not present

## 2020-06-11 DIAGNOSIS — J1282 Pneumonia due to coronavirus disease 2019: Secondary | ICD-10-CM | POA: Diagnosis not present

## 2020-06-11 LAB — COMPREHENSIVE METABOLIC PANEL
ALT: 21 U/L (ref 0–44)
AST: 27 U/L (ref 15–41)
Albumin: 3.3 g/dL — ABNORMAL LOW (ref 3.5–5.0)
Alkaline Phosphatase: 61 U/L (ref 38–126)
Anion gap: 11 (ref 5–15)
BUN: 23 mg/dL (ref 8–23)
CO2: 28 mmol/L (ref 22–32)
Calcium: 8.1 mg/dL — ABNORMAL LOW (ref 8.9–10.3)
Chloride: 93 mmol/L — ABNORMAL LOW (ref 98–111)
Creatinine, Ser: 0.62 mg/dL (ref 0.44–1.00)
GFR, Estimated: >60 mL/min (ref >60)
Glucose, Bld: 150 mg/dL — ABNORMAL HIGH (ref 70–99)
Potassium: 4.7 mmol/L (ref 3.5–5.1)
Sodium: 132 mmol/L — ABNORMAL LOW (ref 135–145)
Total Bilirubin: 0.9 mg/dL (ref 0.3–1.2)
Total Protein: 5.9 g/dL — ABNORMAL LOW (ref 6.5–8.1)

## 2020-06-11 LAB — FERRITIN: Ferritin: 864 ng/mL — ABNORMAL HIGH (ref 11–307)

## 2020-06-11 LAB — D-DIMER, QUANTITATIVE: D-Dimer, Quant: 9.71 ug/mL-FEU — ABNORMAL HIGH (ref 0.00–0.50)

## 2020-06-11 LAB — CBC
HCT: 43.3 % (ref 36.0–46.0)
Hemoglobin: 13.9 g/dL (ref 12.0–15.0)
MCH: 28.6 pg (ref 26.0–34.0)
MCHC: 32.1 g/dL (ref 30.0–36.0)
MCV: 89.1 fL (ref 80.0–100.0)
Platelets: 155 10*3/uL (ref 150–400)
RBC: 4.86 MIL/uL (ref 3.87–5.11)
RDW: 13.5 % (ref 11.5–15.5)
WBC: 16.7 10*3/uL — ABNORMAL HIGH (ref 4.0–10.5)
nRBC: 0 % (ref 0.0–0.2)

## 2020-06-11 LAB — C-REACTIVE PROTEIN: CRP: 0.7 mg/dL (ref <1.0)

## 2020-06-11 MED ORDER — PREDNISONE 20 MG PO TABS
60.0000 mg | ORAL_TABLET | Freq: Every day | ORAL | Status: DC
  Administered 2020-06-12 – 2020-06-13 (×2): 60 mg via ORAL
  Filled 2020-06-11 (×2): qty 3

## 2020-06-11 NOTE — Progress Notes (Signed)
PROGRESS NOTE  Rachel Rachel [name] YDX:[id number] DOB: [birth year] DOA: 05/31/2020 PCP: Rachel Furry, MD  Brief History: Rachel Rachel [name] will be admitted to the hospital with a working diagnosis of acute hypoxic respiratory failure due to SARS COVID-19 viral pneumonia.  65 year old Rachel [name] unvaccinated for COVID-19 who presents with 7 days of upper respiratory tract infection symptoms that has progressed into dyspnea and cough for the last 48 hours. Poor appetite and generalized weakness. On her initial physical examination her oximetry was 83% on room air, placed on 6 L per nasal cannula reaching 92%,temperature 38.4 C, blood pressure 130/66, 83/68, respiratory rate 28-31, oxygen saturation 95% on 9 L/min per high flow nasal cannula.She has dry mucous membranes, lungs with rales bilaterally but no wheezing, heart S1-S2, present rhythmic, soft abdomen no extremity edema.  Sodium 131, potassium 3.0, chloride 99, bicarb 23, glucose 106, BUN 12, creatinine 0.79, troponin I 15-18, white count 8.8, hemoglobin 0.9, hematocrit 36.7, platelets 240. SARS COVID-19 positive. Chest radiograph with multilobar interstitial infiltrates, all 4 quadrants, predominantly lower lobes, more right than left.  CT chest with no evidence of pulmonary embolism, bilateral multilobar dense groundglass opacities.  Patient with high inflammatory markers and high oxygen requirementsinitially.tociluzumab 02/19and started on IV steroids.   Assessment/Plan: Acute hypoxemic respiratory failure due to COVID-19 viral pneumonia Sp tociluzumab 02/19. -going from 15HFNC+NRB>>4LHFNC+NRB>>12LHFNC -finished 5 days remdesivir -continueIV solumedrol to 70 mg bid>>prednisone as pt has had 10 days IV steroids -On bronchodilator therapy, antitussive agents and airway clearing techniques. -Continue to encourage prone as tolerated 4 hrs at the time for a total of 16 hr per day. -She did have elevated  D-dimer, but ultrasound of lower extremities and CT chest is negative for VTE. -CRP 21.7>>12.4>>3.5>>2.1>0.9>>0.6--><0.5>>0.7 -Ferritin 1032>>1037>>600>>596>>660>>703>>773>>824>>864 -D-dimer 1.79>.5.68>>3.Rachel>>14.12>>20.00>>19.13>>16.49>>9.71  Hyponatremia and hypokalemia due to dehydration. -due to volume depletion and poor solute intake -Overall electrolytes have corrected and volume status has improved  GERD. -continue protonix  Hx nephrolithiasis.  -No clinical signs of exacerbation  Goals of Care Advance care planning, including the explanation and discussion of advance directives was carried out with the patient and family. Code status including explanations of "Full Code" and "DNR" and alternatives were discussed in detail. Discussion of end-of-life issues including but not limited palliative care, hospice care and the concept of hospice, other end-of-life care options, power of attorney for health care decisions, living wills, and physician orders for life-sustaining treatment were also discussed with the patient and family. Total face to face time 16 minutes. -confirmed DNR with patientinitially, but after speaking with spouse, she and spouse changed minds and wanted full scope of care       Status is: Inpatient  Remains inpatient appropriate because:IV treatments appropriate due to intensity of illness or inability to take PO   Dispo: The patient is from:Home Anticipated d/c is HM:CNOB Anticipated d/c date is: > 3 days Patient currently is not medically stable to d/c. Difficult to place patient No        Family Communication:spouse updated 3/1  Consultants:none  Code Status:DNR  DVT Prophylaxis:[place] Lovenox   Procedures: As Listed in Progress Note Above  Antibiotics: None     Subjective: Patient denies fevers, chills, headache, chest pain, dyspnea, nausea,  vomiting, diarrhea, abdominal pain, dysuria, hematuria, hematochezia, and melena.   Objective: Vitals:   06/11/20 0604 06/11/20 0749 06/11/20 1412 06/11/20 1436  BP: 105/69  127/72   Pulse: 75  75   Resp:  Temp: 98.4 F (36.9 C)  97.9 F (36.6 C)   TempSrc: Oral  Oral   SpO2: (!) 84% 93% 90% 99%  Weight:      Height:        Intake/Output Summary (Last 24 hours) at 06/11/2020 1901 Last data filed at 06/11/2020 1300 Gross per 24 hour  Intake 740 ml  Output --  Net 740 ml   Weight change:  Exam:   General:  Pt is alert, follows commands appropriately, not in acute distress  HEENT: No icterus, No thrush, No neck mass, [place]/AT  Cardiovascular: RRR, S1/S2, no rubs, no gallops  Respiratory: bibasilar rales. No wheeze  Abdomen: Soft/+BS, non tender, non distended, no guarding  Extremities: No edema, No lymphangitis, No petechiae, No rashes, no synovitis   Data Reviewed: I have personally reviewed following labs and imaging studies Basic Metabolic Panel: Recent Labs  Lab 06/06/20 0404 06/07/20 0622 06/08/20 0649 06/10/20 0603 06/11/20 0629  NA 138 136 135 134* 132*  K 4.7 5.4* 4.8 5.2* 4.7  CL 98 97* 94* 94* 93*  CO2 27 29 29 29 28   GLUCOSE 157* 150* 140* 175* 150*  BUN 27* 25* 30* 26* 23  CREATININE 0.74 0.82 0.79 0.71 0.62  CALCIUM 9.2 8.8* 9.2 8.7* 8.1*   Liver Function Tests: Recent Labs  Lab 06/06/20 0404 06/07/20 0622 06/08/20 0649 06/10/20 0603 06/11/20 0629  AST 32 41 37 26 27  ALT 23 28 28 24 21   ALKPHOS 64 67 70 63 61  BILITOT 0.8 1.0 1.0 0.7 0.9  PROT 6.1* 6.0* 6.2* 5.5* 5.9*  ALBUMIN 3.1* 3.1* 3.3* 3.0* 3.3*   No results for input(s): LIPASE, AMYLASE in the last 168 hours. No results for input(s): AMMONIA in the last 168 hours. Coagulation Profile: No results for input(s): INR, PROTIME in the last 168 hours. CBC: Recent Labs  Lab 06/06/20 0404 06/07/20 0622 06/08/20 0649 06/10/20 0603 06/11/20 0629  WBC 15.5* 18.7* 18.1* 15.0*  16.7*  HGB 14.0 14.2 14.3 13.3 13.9  HCT 42.6 43.9 43.9 41.1 43.3  MCV 90.3 88.3 88.9 89.3 89.1  PLT 219 169 174 131* 155   Cardiac Enzymes: No results for input(s): CKTOTAL, CKMB, CKMBINDEX, TROPONINI in the last 168 hours. BNP: Invalid input(s): POCBNP CBG: No results for input(s): GLUCAP in the last 168 hours. HbA1C: No results for input(s): HGBA1C in the last 72 hours. Urine analysis: No results found for: COLORURINE, APPEARANCEUR, LABSPEC, PHURINE, GLUCOSEU, HGBUR, BILIRUBINUR, KETONESUR, PROTEINUR, UROBILINOGEN, NITRITE, LEUKOCYTESUR Sepsis Labs: @LABRCNTIP (procalcitonin:4,lacticidven:4) )No results found for this or any previous visit (from the past 240 hour(s)).   Scheduled Meds: . chlorpheniramine-HYDROcodone  5 mL Oral Q12H  . enoxaparin (LOVENOX) injection  40 mg Subcutaneous Q24H  . Ipratropium-Albuterol  1 puff Inhalation TID  . methylPREDNISolone (SOLU-MEDROL) injection  70 mg Intravenous Q12H  . oxymetazoline  1 spray Each Nare BID  . pantoprazole  40 mg Oral Daily  . polyethylene glycol  17 g Oral BID  . senna  2 tablet Oral Daily   Continuous Infusions:  Procedures/Studies: DG Chest 1 View  Result Date: 06/03/2020 CLINICAL DATA:  COVID 19 pneumonia. EXAM: CHEST  1 VIEW COMPARISON:  CT a chest in chest x-ray dated May 31, 2020. FINDINGS: Unchanged mild cardiomegaly. Diffuse bilateral interstitial and patchy bibasilar airspace opacities are not significantly changed. No pleural effusion or pneumothorax. No acute osseous abnormality. IMPRESSION: 1. Unchanged multifocal COVID-19 pneumonia. Electronically Signed   By: M.D.   On: 06/03/2020 12:31  CT Angio Chest PE W/Cm &/Or Wo Cm  Result Date: 05/31/2020 CLINICAL DATA:  Shortness of breath.  COVID-19 positive. EXAM: CT ANGIOGRAPHY CHEST WITH CONTRAST TECHNIQUE: Multidetector CT imaging of the chest was performed using the standard protocol during bolus administration of intravenous contrast.  Multiplanar CT image reconstructions and MIPs were obtained to evaluate the vascular anatomy. CONTRAST:  75mL OMNIPAQUE IOHEXOL 350 MG/ML SOLN COMPARISON:  None. FINDINGS: Cardiovascular: Satisfactory opacification of the pulmonary arteries to the segmental level. No evidence of pulmonary embolism. Mild cardiomegaly. No pericardial effusion. Atherosclerosis of thoracic aorta is noted without aneurysm formation. Mediastinum/Nodes: No enlarged mediastinal, hilar, or axillary lymph nodes. Thyroid gland, trachea, and esophagus demonstrate no significant findings. Lungs/Pleura: No pneumothorax or pleural effusion is noted. Patchy airspace opacities are noted throughout both lungs consistent with multifocal pneumonia. Upper Abdomen: No acute abnormality. Musculoskeletal: No chest wall abnormality. No acute or significant osseous findings. Review of the MIP images confirms the above findings. IMPRESSION: 1. No definite evidence of pulmonary embolus. 2. Patchy airspace opacities are noted throughout both lungs consistent with multifocal pneumonia. 3. Aortic atherosclerosis. Aortic Atherosclerosis (ICD10-I70.0). Electronically Signed   By: Lupita RaiderJames  Green Jr M.D.   On: 05/31/2020 14:32   US Venous Img Lower Bilateral (DVT)  Result Date: 06/03/2020 CLINICAL DATA:  Elevated D-dimer. Former smoker. Patient is currently on anticoagulation. Evaluate for DVT. EXAM: BILATERAL LOWER EXTREMITY VENOUS DOPPLER ULTRASOUND TECHNIQUE: Gray-scale sonography with graded compression, as well as color Doppler and duplex ultrasound were performed to evaluate the lower extremity deep venous systems from the level of the common femoral vein and including the common femoral, femoral, profunda femoral, popliteal and calf veins including the posterior tibial, peroneal and gastrocnemius veins when visible. The superficial great saphenous vein was also interrogated. Spectral Doppler was utilized to evaluate flow at rest and with distal augmentation  maneuvers in the common femoral, femoral and popliteal veins. COMPARISON:  None. FINDINGS: RIGHT LOWER EXTREMITY Common Femoral Vein: No evidence of thrombus. Normal compressibility, respiratory phasicity and response to augmentation. Saphenofemoral Junction: No evidence of thrombus. Normal compressibility and flow on color Doppler imaging. Profunda Femoral Vein: No evidence of thrombus. Normal compressibility and flow on color Doppler imaging. Femoral Vein: No evidence of thrombus. Normal compressibility, respiratory phasicity and response to augmentation. Popliteal Vein: No evidence of thrombus. Normal compressibility, respiratory phasicity and response to augmentation. Calf Veins: No evidence of thrombus. Normal compressibility and flow on color Doppler imaging. Superficial Great Saphenous Vein: No evidence of thrombus. Normal compressibility. Venous Reflux:  None. Other Findings: Note is made of an approximately 2.2 x 1.2 x 1.1 cm anechoic fluid collection within the right popliteal fossa compatible with a Baker's cyst. LEFT LOWER EXTREMITY Common Femoral Vein: No evidence of thrombus. Normal compressibility, respiratory phasicity and response to augmentation. Saphenofemoral Junction: No evidence of thrombus. Normal compressibility and flow on color Doppler imaging. Profunda Femoral Vein: No evidence of thrombus. Normal compressibility and flow on color Doppler imaging. Femoral Vein: No evidence of thrombus. Normal compressibility, respiratory phasicity and response to augmentation. Popliteal Vein: No evidence of thrombus. Normal compressibility, respiratory phasicity and response to augmentation. Calf Veins: No evidence of thrombus. Normal compressibility and flow on color Doppler imaging. Superficial Great Saphenous Vein: No evidence of thrombus. Normal compressibility. Venous Reflux:  None. Other Findings:  None. IMPRESSION: 1. No evidence of DVT within either lower extremity. 2. Incidental note made of an  approximately 2.2 cm right-sided Baker's cyst. Electronically Signed   By: Holland CommonsJohn  Watts M.D.  On: 06/03/2020 15:42   DG Chest Port 1 View  Result Date: 05/31/2020 CLINICAL DATA:  Shortness of breath. EXAM: PORTABLE CHEST 1 VIEW COMPARISON:  March 25, 2009. FINDINGS: The heart size and mediastinal contours are within normal limits. No pneumothorax or pleural effusion is noted. Patchy airspace opacities are noted throughout both lungs consistent with multifocal pneumonia. The visualized skeletal structures are unremarkable. IMPRESSION: Bilateral multifocal pneumonia. Electronically Signed   By: Lupita Raider M.D.   On: 05/31/2020 13:24    Catarina Hartshorn, DO  [facility]  If 7PM-7AM, please contact night-coverage [email or web address] Password [email or web address] 06/11/2020, 7:01 PM   LOS: 11 days

## 2020-06-11 NOTE — Clinical Social Work Note (Signed)
Patient will likely need home oxygen when she discharges. Discussed DME companies. Referral made to [employer] for home oxygen.    Cledis Sohn, Juleen China, LCSW

## 2020-06-11 NOTE — Progress Notes (Signed)
Pt spo2 85-87% on 7 lpm [place] High flow [place] pt siting on side of bed pt states she feels ok and will prone when she lay down RN will be informed

## 2020-06-12 ENCOUNTER — Encounter (HOSPITAL_COMMUNITY): Payer: Self-pay | Admitting: Internal Medicine

## 2020-06-12 DIAGNOSIS — R7989 Other specified abnormal findings of blood chemistry: Secondary | ICD-10-CM | POA: Diagnosis not present

## 2020-06-12 DIAGNOSIS — K219 Gastro-esophageal reflux disease without esophagitis: Secondary | ICD-10-CM | POA: Diagnosis not present

## 2020-06-12 DIAGNOSIS — U071 COVID-19: Secondary | ICD-10-CM | POA: Diagnosis not present

## 2020-06-12 DIAGNOSIS — J9601 Acute respiratory failure with hypoxia: Secondary | ICD-10-CM | POA: Diagnosis not present

## 2020-06-12 LAB — C-REACTIVE PROTEIN: CRP: 0.8 mg/dL (ref <1.0)

## 2020-06-12 LAB — D-DIMER, QUANTITATIVE: D-Dimer, Quant: 3.87 ug/mL-FEU — ABNORMAL HIGH (ref 0.00–0.50)

## 2020-06-12 LAB — FERRITIN: Ferritin: 862 ng/mL — ABNORMAL HIGH (ref 11–307)

## 2020-06-12 NOTE — Progress Notes (Signed)
Decreased to 8L/min, O2 94-95%, remains sitting up in chair. Husband, Dorinda Hill, called and update was given per patient request. Will continue to monitor.

## 2020-06-12 NOTE — Progress Notes (Signed)
PROGRESS NOTE  Rachel [name] GEX:[id number] DOB: [birth year] DOA: 05/31/2020 PCP: Tanna Furry, MD  Brief History: Rachel [name] will be admitted to the hospital with a working diagnosis of acute hypoxic respiratory failure due to SARS COVID-19 viral pneumonia.  65 year old female unvaccinated for COVID-19 who presents with 7 days of upper respiratory tract infection symptoms that has progressed into dyspnea and cough for the last 48 hours. Poor appetite and generalized weakness. On her initial physical examination her oximetry was 83% on room air, placed on 6 L per nasal cannula reaching 92%,temperature 38.4 C, blood pressure 130/66, 83/68, respiratory rate 28-31, oxygen saturation 95% on 9 L/min per high flow nasal cannula.She has dry mucous membranes, lungs with rales bilaterally but no wheezing, heart S1-S2, present rhythmic, soft abdomen no extremity edema.  Sodium 131, potassium 3.0, chloride 99, bicarb 23, glucose 106, BUN 12, creatinine 0.79, troponin I 15-18, white count 8.8, hemoglobin 0.9, hematocrit 36.7, platelets 240. SARS COVID-19 positive. Chest radiograph with multilobar interstitial infiltrates, all 4 quadrants, predominantly lower lobes, more right than left.  CT chest with no evidence of pulmonary embolism, bilateral multilobar dense groundglass opacities.  Patient with high inflammatory markers and high oxygen requirementsinitially.tociluzumab 02/19and started on IV steroids.   Assessment/Plan: Acute hypoxemic respiratory failure due to COVID-19 viral pneumonia Sp tociluzumab 02/19. -going from 15HFNC+NRB>>4LHFNC+NRB>>12LHFNC>>intermittently on 8 L HFNC -finished 5 days remdesivir -continueIV solumedrol to 70 mg bid>>prednisone as pt has had 10 days IV steroids.  Continue oral steroids. -On bronchodilator therapy, antitussive agents and airway clearing techniques. -Continue to encourage prone as tolerated 4 hrs at the time for a  total of 16 hr per day. -She did have elevated D-dimer, but ultrasound of lower extremities and CT chest is negative for VTE. -CRP 21.7>>12.4>>3.5>>2.1>0.9>>0.6--><0.5>>0.7 -Ferritin 1032>>1037>>600>>596>>660>>703>>773>>824>>864 -D-dimer 1.79>.5.68>>3.52>>14.12>>20.00>>19.13>>16.49>>9.71  Hyponatremia and hypokalemia due to dehydration. -due to volume depletion and poor solute intake -Overall electrolytes have corrected and volume status has improved  GERD. -continue protonix  Hx nephrolithiasis.  -No clinical signs of exacerbation  Goals of Care Advance care planning, including the explanation and discussion of advance directives was carried out with the patient and family. Code status including explanations of "Full Code" and "DNR" and alternatives were discussed in detail. Discussion of end-of-life issues including but not limited palliative care, hospice care and the concept of hospice, other end-of-life care options, power of attorney for health care decisions, living wills, and physician orders for life-sustaining treatment were also discussed with the patient and family. Total face to face time 16 minutes. -confirmed DNR with patientinitially, but after speaking with spouse, she and spouse changed minds and wanted full scope of care   Status is: Inpatient   Dispo: The patient is from:Home Anticipated d/c is WN:UUVO Anticipated d/c date is: > 3 days Patient currently is not medically stable to d/c. Difficult to place patient No     Family Communication:spouse updated 3/1  Consultants:none  Code Status:DNR  DVT Prophylaxis:[place] Lovenox   Procedures: As Listed in Progress Note Above  Antibiotics: None   Subjective: No fever, no headaches, no chest pain, no nausea, no vomiting.  Continues slowly improving and stabilizing her respiratory status.  Using anywhere from 8 to 12 L  high flow nasal cannula based on activity levels.   Objective: Vitals:   06/12/20 0610 06/12/20 0803 06/12/20 1415 06/12/20 1800  BP:   (!) 106/49   Pulse:   73   Resp:   20  Temp:   97.8 F (36.6 C)   TempSrc:   Oral   SpO2: (!) 89% (!) 89% 93% 94%  Weight:      Height:       No intake or output data in the 24 hours ending 06/12/20 1913 Weight change:   Exam: General exam: Alert, awake, oriented x 3; still requiring high flow nasal cannula supplementation 8-12 L to keep oxygen saturation above 88%.  Patient reports no chest pain, no nausea, no vomiting, no fever. Respiratory system: Positive scattered rhonchi; no using accessory muscle. Cardiovascular system:RRR. No murmurs, rubs, gallops.  No JVD. Gastrointestinal system: Abdomen is nondistended, soft and nontender. No organomegaly or masses felt. Normal bowel sounds heard. Central nervous system: Alert and oriented. No focal neurological deficits. Extremities: No cyanosis or clubbing. Skin: No petechiae. Psychiatry: Judgement and insight appear normal. Mood & affect appropriate.    Data Reviewed: I have personally reviewed following labs and imaging studies  Basic Metabolic Panel: Recent Labs  Lab 06/06/20 0404 06/07/20 0622 06/08/20 0649 06/10/20 0603 06/11/20 0629  NA 138 136 135 134* 132*  K 4.7 5.4* 4.8 5.2* 4.7  CL 98 97* 94* 94* 93*  CO2 27 29 29 29 28   GLUCOSE 157* 150* 140* 175* 150*  BUN 27* 25* 30* 26* 23  CREATININE 0.74 0.82 0.79 0.71 0.62  CALCIUM 9.2 8.8* 9.2 8.7* 8.1*   Liver Function Tests: Recent Labs  Lab 06/06/20 0404 06/07/20 0622 06/08/20 0649 06/10/20 0603 06/11/20 0629  AST 32 41 37 26 27  ALT 23 28 28 24 21   ALKPHOS 64 67 70 63 61  BILITOT 0.8 1.0 1.0 0.7 0.9  PROT 6.1* 6.0* 6.2* 5.5* 5.9*  ALBUMIN 3.1* 3.1* 3.3* 3.0* 3.3*   CBC: Recent Labs  Lab 06/06/20 0404 06/07/20 0622 06/08/20 0649 06/10/20 0603 06/11/20 0629  WBC 15.5* 18.7* 18.1* 15.0* 16.7*  HGB 14.0 14.2  14.3 13.3 13.9  HCT 42.6 43.9 43.9 41.1 43.3  MCV 90.3 88.3 88.9 89.3 89.1  PLT 219 169 174 131* 155   Scheduled Meds: . chlorpheniramine-HYDROcodone  5 mL Oral Q12H  . enoxaparin (LOVENOX) injection  40 mg Subcutaneous Q24H  . Ipratropium-Albuterol  1 puff Inhalation TID  . oxymetazoline  1 spray Each Nare BID  . pantoprazole  40 mg Oral Daily  . polyethylene glycol  17 g Oral BID  . predniSONE  60 mg Oral Q breakfast  . senna  2 tablet Oral Daily   Continuous Infusions:  Procedures/Studies: DG Chest 1 View  Result Date: 06/03/2020 CLINICAL DATA:  COVID 19 pneumonia. EXAM: CHEST  1 VIEW COMPARISON:  CT a chest in chest x-ray dated May 31, 2020. FINDINGS: Unchanged mild cardiomegaly. Diffuse bilateral interstitial and patchy bibasilar airspace opacities are not significantly changed. No pleural effusion or pneumothorax. No acute osseous abnormality. IMPRESSION: 1. Unchanged multifocal COVID-19 pneumonia. Electronically Signed   By: 06/05/2020 M.D.   On: 06/03/2020 12:31   CT Angio Chest PE W/Cm &/Or Wo Cm  Result Date: 05/31/2020 CLINICAL DATA:  Shortness of breath.  COVID-19 positive. EXAM: CT ANGIOGRAPHY CHEST WITH CONTRAST TECHNIQUE: Multidetector CT imaging of the chest was performed using the standard protocol during bolus administration of intravenous contrast. Multiplanar CT image reconstructions and MIPs were obtained to evaluate the vascular anatomy. CONTRAST:  84mL OMNIPAQUE IOHEXOL 350 MG/ML SOLN COMPARISON:  None. FINDINGS: Cardiovascular: Satisfactory opacification of the pulmonary arteries to the segmental level. No evidence of pulmonary embolism. Mild cardiomegaly. No pericardial effusion.  Atherosclerosis of thoracic aorta is noted without aneurysm formation. Mediastinum/Nodes: No enlarged mediastinal, hilar, or axillary lymph nodes. Thyroid gland, trachea, and esophagus demonstrate no significant findings. Lungs/Pleura: No pneumothorax or pleural effusion is noted.  Patchy airspace opacities are noted throughout both lungs consistent with multifocal pneumonia. Upper Abdomen: No acute abnormality. Musculoskeletal: No chest wall abnormality. No acute or significant osseous findings. Review of the MIP images confirms the above findings. IMPRESSION: 1. No definite evidence of pulmonary embolus. 2. Patchy airspace opacities are noted throughout both lungs consistent with multifocal pneumonia. 3. Aortic atherosclerosis. Aortic Atherosclerosis (ICD10-I70.0). Electronically Signed   By: Lupita Raider M.D.   On: 05/31/2020 14:32   US Venous Img Lower Bilateral (DVT)  Result Date: 06/03/2020 CLINICAL DATA:  Elevated D-dimer. Former smoker. Patient is currently on anticoagulation. Evaluate for DVT. EXAM: BILATERAL LOWER EXTREMITY VENOUS DOPPLER ULTRASOUND TECHNIQUE: Gray-scale sonography with graded compression, as well as color Doppler and duplex ultrasound were performed to evaluate the lower extremity deep venous systems from the level of the common femoral vein and including the common femoral, femoral, profunda femoral, popliteal and calf veins including the posterior tibial, peroneal and gastrocnemius veins when visible. The superficial great saphenous vein was also interrogated. Spectral Doppler was utilized to evaluate flow at rest and with distal augmentation maneuvers in the common femoral, femoral and popliteal veins. COMPARISON:  None. FINDINGS: RIGHT LOWER EXTREMITY Common Femoral Vein: No evidence of thrombus. Normal compressibility, respiratory phasicity and response to augmentation. Saphenofemoral Junction: No evidence of thrombus. Normal compressibility and flow on color Doppler imaging. Profunda Femoral Vein: No evidence of thrombus. Normal compressibility and flow on color Doppler imaging. Femoral Vein: No evidence of thrombus. Normal compressibility, respiratory phasicity and response to augmentation. Popliteal Vein: No evidence of thrombus. Normal  compressibility, respiratory phasicity and response to augmentation. Calf Veins: No evidence of thrombus. Normal compressibility and flow on color Doppler imaging. Superficial Great Saphenous Vein: No evidence of thrombus. Normal compressibility. Venous Reflux:  None. Other Findings: Note is made of an approximately 2.2 x 1.2 x 1.1 cm anechoic fluid collection within the right popliteal fossa compatible with a Baker's cyst. LEFT LOWER EXTREMITY Common Femoral Vein: No evidence of thrombus. Normal compressibility, respiratory phasicity and response to augmentation. Saphenofemoral Junction: No evidence of thrombus. Normal compressibility and flow on color Doppler imaging. Profunda Femoral Vein: No evidence of thrombus. Normal compressibility and flow on color Doppler imaging. Femoral Vein: No evidence of thrombus. Normal compressibility, respiratory phasicity and response to augmentation. Popliteal Vein: No evidence of thrombus. Normal compressibility, respiratory phasicity and response to augmentation. Calf Veins: No evidence of thrombus. Normal compressibility and flow on color Doppler imaging. Superficial Great Saphenous Vein: No evidence of thrombus. Normal compressibility. Venous Reflux:  None. Other Findings:  None. IMPRESSION: 1. No evidence of DVT within either lower extremity. 2. Incidental note made of an approximately 2.2 cm right-sided Baker's cyst. Electronically Signed   By: Simonne Come M.D.   On: 06/03/2020 15:42   DG Chest Port 1 View  Result Date: 05/31/2020 CLINICAL DATA:  Shortness of breath. EXAM: PORTABLE CHEST 1 VIEW COMPARISON:  March 25, 2009. FINDINGS: The heart size and mediastinal contours are within normal limits. No pneumothorax or pleural effusion is noted. Patchy airspace opacities are noted throughout both lungs consistent with multifocal pneumonia. The visualized skeletal structures are unremarkable. IMPRESSION: Bilateral multifocal pneumonia. Electronically Signed   By: Lupita Raider M.D.   On: 05/31/2020 13:24    Vassie Loll, MD  [facility]  If 7PM-7AM, please contact night-coverage [email or web address] Password [email or web address] 06/12/2020, 7:13 PM   LOS: 12 days

## 2020-06-12 NOTE — Progress Notes (Signed)
Patient able to self-prone, remains on HFNC 12L/min with O2 sats 92-95%. Will continue to monitor.

## 2020-06-13 ENCOUNTER — Inpatient Hospital Stay (HOSPITAL_COMMUNITY): Payer: BLUE CROSS/BLUE SHIELD

## 2020-06-13 ENCOUNTER — Encounter (HOSPITAL_COMMUNITY): Payer: Self-pay | Admitting: Internal Medicine

## 2020-06-13 DIAGNOSIS — U071 COVID-19: Secondary | ICD-10-CM | POA: Diagnosis not present

## 2020-06-13 DIAGNOSIS — K219 Gastro-esophageal reflux disease without esophagitis: Secondary | ICD-10-CM | POA: Diagnosis not present

## 2020-06-13 DIAGNOSIS — R7989 Other specified abnormal findings of blood chemistry: Secondary | ICD-10-CM | POA: Diagnosis not present

## 2020-06-13 DIAGNOSIS — J9601 Acute respiratory failure with hypoxia: Secondary | ICD-10-CM | POA: Diagnosis not present

## 2020-06-13 LAB — D-DIMER, QUANTITATIVE: D-Dimer, Quant: 3.38 ug/mL-FEU — ABNORMAL HIGH (ref 0.00–0.50)

## 2020-06-13 LAB — C-REACTIVE PROTEIN: CRP: 1.3 mg/dL — ABNORMAL HIGH (ref <1.0)

## 2020-06-13 LAB — FERRITIN: Ferritin: 1039 ng/mL — ABNORMAL HIGH (ref 11–307)

## 2020-06-13 MED ORDER — HYDROMORPHONE HCL 1 MG/ML IJ SOLN
1.0000 mg | INTRAMUSCULAR | Status: DC | PRN
  Administered 2020-06-13 – 2020-06-14 (×7): 1 mg via INTRAVENOUS
  Filled 2020-06-13 (×7): qty 1

## 2020-06-13 MED ORDER — CYCLOBENZAPRINE HCL 10 MG PO TABS
10.0000 mg | ORAL_TABLET | Freq: Once | ORAL | Status: AC
  Administered 2020-06-13: 10 mg via ORAL
  Filled 2020-06-13: qty 1

## 2020-06-13 MED ORDER — METHOCARBAMOL 500 MG PO TABS
500.0000 mg | ORAL_TABLET | Freq: Four times a day (QID) | ORAL | Status: DC | PRN
  Administered 2020-06-13 – 2020-06-14 (×3): 500 mg via ORAL
  Filled 2020-06-13 (×3): qty 1

## 2020-06-13 MED ORDER — CYCLOBENZAPRINE HCL 5 MG PO TABS
7.5000 mg | ORAL_TABLET | Freq: Once | ORAL | Status: DC
  Filled 2020-06-13: qty 1.5

## 2020-06-13 MED ORDER — LIDOCAINE 5 % EX PTCH
1.0000 | MEDICATED_PATCH | CUTANEOUS | Status: DC
  Administered 2020-06-13 – 2020-07-09 (×14): 1 via TRANSDERMAL
  Filled 2020-06-13 (×21): qty 1

## 2020-06-13 NOTE — Progress Notes (Signed)
PROGRESS NOTE  Rachel [name] VFI:[id number] DOB: 1955/05/05 DOA: 05/31/2020 PCP: Tanna Furry, MD  Brief History: Rachel [name] will be admitted to the hospital with a working diagnosis of acute hypoxic respiratory failure due to SARS COVID-19 viral pneumonia.  65 year old female unvaccinated for COVID-19 who presents with 7 days of upper respiratory tract infection symptoms that has progressed into dyspnea and cough for the last 48 hours. Poor appetite and generalized weakness. On her initial physical examination her oximetry was 83% on room air, placed on 6 L per nasal cannula reaching 92%,temperature 38.4 C, blood pressure 130/66, 83/68, respiratory rate 28-31, oxygen saturation 95% on 9 L/min per high flow nasal cannula.She has dry mucous membranes, lungs with rales bilaterally but no wheezing, heart S1-S2, present rhythmic, soft abdomen no extremity edema.  Sodium 131, potassium 3.0, chloride 99, bicarb 23, glucose 106, BUN 12, creatinine 0.79, troponin I 15-18, white count 8.8, hemoglobin 0.9, hematocrit 36.7, platelets 240. SARS COVID-19 positive. Chest radiograph with multilobar interstitial infiltrates, all 4 quadrants, predominantly lower lobes, more right than left.  CT chest with no evidence of pulmonary embolism, bilateral multilobar dense groundglass opacities.  Patient with high inflammatory markers and high oxygen requirementsinitially.tociluzumab 02/19and started on IV steroids.   Assessment/Plan: Acute hypoxemic respiratory failure due to COVID-19 viral pneumonia Sp tociluzumab 02/19. -going from 15HFNC+NRB>>4LHFNC+NRB>>12LHFNC>>intermittently on 8 L HFNC -finished 5 days remdesivir -continueIV solumedrol to 70 mg bid>>prednisone as pt has had 10 days IV steroids.  Continue oral steroids. -On bronchodilator therapy, antitussive agents and airway clearing techniques. -Continue to encourage prone as tolerated 4 hrs at the time for a  total of 16 hr per day. -She did have elevated D-dimer, but ultrasound of lower extremities and CT chest is negative for VTE. -CRP 21.7>>12.4>>3.5>>2.1>0.9>>0.6--><0.5>>0.7 -Ferritin 1032>>1037>>600>>596>>660>>703>>773>>824>>864 -D-dimer 1.79>.5.68>>3.52>>14.12>>20.00>>19.13>>16.49>>9.71  Pleuritic back discomfort -Repeat x-ray demonstrating bilateral multifocal changes characteristic for Covid pneumonia -No pulmonary edema or vascular congestion. -Patient afebrile and requiring less oxygen supplementation currently.  No chest pain and not tachycardia. -Will provide pain medication and muscle relaxant. -Continue the use of flutter valve and incentive spirometer.  Hyponatremia and hypokalemia due to dehydration. -due to volume depletion and poor solute intake -Overall electrolytes have corrected and volume status has improved  GERD. -continue protonix  Hx nephrolithiasis.  -No clinical signs of exacerbation  Goals of Care Advance care planning, including the explanation and discussion of advance directives was carried out with the patient and family. Code status including explanations of "Full Code" and "DNR" and alternatives were discussed in detail. Discussion of end-of-life issues including but not limited palliative care, hospice care and the concept of hospice, other end-of-life care options, power of attorney for health care decisions, living wills, and physician orders for life-sustaining treatment were also discussed with the patient and family. Total face to face time 16 minutes. -confirmed DNR with patientinitially, but after speaking with spouse, she and spouse changed minds and wanted full scope of care   Status is: Inpatient   Dispo: The patient is from:Home Anticipated d/c is CZ:YSAY Anticipated d/c date is: > 3 days Patient currently is not medically stable to d/c. Difficult to place patient  No     Family Communication:spouse updated 3/1  Consultants:none  Code Status:DNR  DVT Prophylaxis:[place] Lovenox   Procedures: As Listed in Progress Note Above  Antibiotics: None   Subjective: No fever, no chest pain, no nausea, no vomiting.  Requiring 8 L high flow nasal cannula supplementation  to maintain O2 sat above 90%.  Feeling short winded with activity and complaining of severe left-sided pleuritic pain since last night.   Objective: Vitals:   06/12/20 1932 06/12/20 2023 06/13/20 1350 06/13/20 1427  BP:  127/64 118/84   Pulse:  64 84   Resp:  20 (!) 24   Temp:  99.1 F (37.3 C) 98.5 F (36.9 C)   TempSrc:  Oral Axillary   SpO2: (!) 89% 93% 95% 90%  Weight:      Height:        Intake/Output Summary (Last 24 hours) at 06/13/2020 1820 Last data filed at 06/13/2020 1300 Gross per 24 hour  Intake 440 ml  Output -  Net 440 ml   Weight change:   Exam: General exam: Alert, awake, oriented x 3; still requiring 8 L high flow nasal cannula supplementation; complaining of mild difficulty breathing due to ongoing pleuritic pain along with short winded sensation with activity. Respiratory system: Positive rhonchi bilaterally; no rubs, no wheezing on exam.  Decreased breath sounds at the bases.  No using accessory muscles on exam. Cardiovascular system:RRR. No murmurs, rubs, gallops.  No JVD. Gastrointestinal system: Abdomen is nondistended, soft and nontender. No organomegaly or masses felt. Normal bowel sounds heard. Central nervous system: Alert and oriented. No focal neurological deficits. Extremities: No cyanosis or clubbing. Skin: No petechiae. Psychiatry: Judgement and insight appear normal. Mood & affect appropriate.    Data Reviewed: I have personally reviewed following labs and imaging studies  Basic Metabolic Panel: Recent Labs  Lab 06/07/20 0622 06/08/20 0649 06/10/20 0603 06/11/20 0629  NA 136 135 134* 132*  K 5.4* 4.8 5.2* 4.7   CL 97* 94* 94* 93*  CO2 29 29 29 28   GLUCOSE 150* 140* 175* 150*  BUN 25* 30* 26* 23  CREATININE 0.82 0.79 0.71 0.62  CALCIUM 8.8* 9.2 8.7* 8.1*   Liver Function Tests: Recent Labs  Lab 06/07/20 0622 06/08/20 0649 06/10/20 0603 06/11/20 0629  AST 41 37 26 27  ALT 28 28 24 21   ALKPHOS 67 70 63 61  BILITOT 1.0 1.0 0.7 0.9  PROT 6.0* 6.2* 5.5* 5.9*  ALBUMIN 3.1* 3.3* 3.0* 3.3*   CBC: Recent Labs  Lab 06/07/20 0622 06/08/20 0649 06/10/20 0603 06/11/20 0629  WBC 18.7* 18.1* 15.0* 16.7*  HGB 14.2 14.3 13.3 13.9  HCT 43.9 43.9 41.1 43.3  MCV 88.3 88.9 89.3 89.1  PLT 169 174 131* 155   Scheduled Meds: . chlorpheniramine-HYDROcodone  5 mL Oral Q12H  . enoxaparin (LOVENOX) injection  40 mg Subcutaneous Q24H  . Ipratropium-Albuterol  1 puff Inhalation TID  . lidocaine  1 patch Transdermal Q24H  . oxymetazoline  1 spray Each Nare BID  . pantoprazole  40 mg Oral Daily  . polyethylene glycol  17 g Oral BID  . predniSONE  60 mg Oral Q breakfast  . senna  2 tablet Oral Daily   Continuous Infusions:  Procedures/Studies: DG Chest 1 View  Result Date: 06/03/2020 CLINICAL DATA:  COVID 19 pneumonia. EXAM: CHEST  1 VIEW COMPARISON:  CT a chest in chest x-ray dated May 31, 2020. FINDINGS: Unchanged mild cardiomegaly. Diffuse bilateral interstitial and patchy bibasilar airspace opacities are not significantly changed. No pleural effusion or pneumothorax. No acute osseous abnormality. IMPRESSION: 1. Unchanged multifocal COVID-19 pneumonia. Electronically Signed   By: 06/05/2020 M.D.   On: 06/03/2020 12:31   CT Angio Chest PE W/Cm &/Or Wo Cm  Result Date: 05/31/2020 CLINICAL DATA:  Shortness  of breath.  COVID-19 positive. EXAM: CT ANGIOGRAPHY CHEST WITH CONTRAST TECHNIQUE: Multidetector CT imaging of the chest was performed using the standard protocol during bolus administration of intravenous contrast. Multiplanar CT image reconstructions and MIPs were obtained to evaluate  the vascular anatomy. CONTRAST:  75mL OMNIPAQUE IOHEXOL 350 MG/ML SOLN COMPARISON:  None. FINDINGS: Cardiovascular: Satisfactory opacification of the pulmonary arteries to the segmental level. No evidence of pulmonary embolism. Mild cardiomegaly. No pericardial effusion. Atherosclerosis of thoracic aorta is noted without aneurysm formation. Mediastinum/Nodes: No enlarged mediastinal, hilar, or axillary lymph nodes. Thyroid gland, trachea, and esophagus demonstrate no significant findings. Lungs/Pleura: No pneumothorax or pleural effusion is noted. Patchy airspace opacities are noted throughout both lungs consistent with multifocal pneumonia. Upper Abdomen: No acute abnormality. Musculoskeletal: No chest wall abnormality. No acute or significant osseous findings. Review of the MIP images confirms the above findings. IMPRESSION: 1. No definite evidence of pulmonary embolus. 2. Patchy airspace opacities are noted throughout both lungs consistent with multifocal pneumonia. 3. Aortic atherosclerosis. Aortic Atherosclerosis (ICD10-I70.0). Electronically Signed   By: Lupita RaiderJames  Green Jr M.D.   On: 05/31/2020 14:32   US Venous Img Lower Bilateral (DVT)  Result Date: 06/03/2020 CLINICAL DATA:  Elevated D-dimer. Former smoker. Patient is currently on anticoagulation. Evaluate for DVT. EXAM: BILATERAL LOWER EXTREMITY VENOUS DOPPLER ULTRASOUND TECHNIQUE: Gray-scale sonography with graded compression, as well as color Doppler and duplex ultrasound were performed to evaluate the lower extremity deep venous systems from the level of the common femoral vein and including the common femoral, femoral, profunda femoral, popliteal and calf veins including the posterior tibial, peroneal and gastrocnemius veins when visible. The superficial great saphenous vein was also interrogated. Spectral Doppler was utilized to evaluate flow at rest and with distal augmentation maneuvers in the common femoral, femoral and popliteal veins.  COMPARISON:  None. FINDINGS: RIGHT LOWER EXTREMITY Common Femoral Vein: No evidence of thrombus. Normal compressibility, respiratory phasicity and response to augmentation. Saphenofemoral Junction: No evidence of thrombus. Normal compressibility and flow on color Doppler imaging. Profunda Femoral Vein: No evidence of thrombus. Normal compressibility and flow on color Doppler imaging. Femoral Vein: No evidence of thrombus. Normal compressibility, respiratory phasicity and response to augmentation. Popliteal Vein: No evidence of thrombus. Normal compressibility, respiratory phasicity and response to augmentation. Calf Veins: No evidence of thrombus. Normal compressibility and flow on color Doppler imaging. Superficial Great Saphenous Vein: No evidence of thrombus. Normal compressibility. Venous Reflux:  None. Other Findings: Note is made of an approximately 2.2 x 1.2 x 1.1 cm anechoic fluid collection within the right popliteal fossa compatible with a Baker's cyst. LEFT LOWER EXTREMITY Common Femoral Vein: No evidence of thrombus. Normal compressibility, respiratory phasicity and response to augmentation. Saphenofemoral Junction: No evidence of thrombus. Normal compressibility and flow on color Doppler imaging. Profunda Femoral Vein: No evidence of thrombus. Normal compressibility and flow on color Doppler imaging. Femoral Vein: No evidence of thrombus. Normal compressibility, respiratory phasicity and response to augmentation. Popliteal Vein: No evidence of thrombus. Normal compressibility, respiratory phasicity and response to augmentation. Calf Veins: No evidence of thrombus. Normal compressibility and flow on color Doppler imaging. Superficial Great Saphenous Vein: No evidence of thrombus. Normal compressibility. Venous Reflux:  None. Other Findings:  None. IMPRESSION: 1. No evidence of DVT within either lower extremity. 2. Incidental note made of an approximately 2.2 cm right-sided Baker's cyst. Electronically  Signed   By: Simonne ComeJohn  Watts M.D.   On: 06/03/2020 15:42   DG CHEST PORT 1 VIEW  Result Date: 06/13/2020 CLINICAL  DATA:  Worsening shortness of breath, COVID-19 positive on 05/31/2020, former smoker EXAM: PORTABLE CHEST 1 VIEW COMPARISON:  Portable exam 0850 hours compared to 06/03/2020 FINDINGS: Normal heart size mediastinal contours. Patchy BILATERAL pulmonary infiltrates consistent with multifocal pneumonia and COVID-19, increased from previous exam. No pleural effusion or pneumothorax. No acute osseous findings. IMPRESSION: Increased BILATERAL pulmonary infiltrates consistent with multifocal pneumonia and COVID-19. Electronically Signed   By: Ulyses Southward M.D.   On: 06/13/2020 11:06   DG Chest Port 1 View  Result Date: 05/31/2020 CLINICAL DATA:  Shortness of breath. EXAM: PORTABLE CHEST 1 VIEW COMPARISON:  March 25, 2009. FINDINGS: The heart size and mediastinal contours are within normal limits. No pneumothorax or pleural effusion is noted. Patchy airspace opacities are noted throughout both lungs consistent with multifocal pneumonia. The visualized skeletal structures are unremarkable. IMPRESSION: Bilateral multifocal pneumonia. Electronically Signed   By: Lupita Raider M.D.   On: 05/31/2020 13:24    Vassie Loll, MD  [facility]  If 7PM-7AM, please contact night-coverage [email or web address] Password [email or web address] 06/13/2020, 6:20 PM   LOS: 13 days

## 2020-06-13 NOTE — Progress Notes (Signed)
Initial Nutrition Assessment  DOCUMENTATION CODES:      INTERVENTION:  Ensure Enlive po TID, each supplement provides 350 kcal and 20 grams of protein   Magic cup TID with meals, each supplement provides 290 kcal and 9 grams of protein   NUTRITION DIAGNOSIS:   Increased nutrient needs related to acute illness (COVID-19 pneumonia) as evidenced by estimated needs.   GOAL:  Provide needs based on ASPEN/SCCM guidelines  MONITOR:  PO intake,Weight trends,Supplement acceptance,I & O's,Labs,Skin  REASON FOR ASSESSMENT:   LOS    ASSESSMENT: Patient transferred to ICU for heated high flow with BiPAP. She  presented with acute hypoxic respiratory failure due to COVID-19 pneumonia. Complaint of decreased appetite and general weakness prior to admission.  History of GERD, Kidney stones, hypercholesteremia. Lives with her husband.   Meal intake 2/26->3/2 100% of all meals but 1 at 75% completed. On 3/3 intake dropped significantly to 0-15%.   Wt on 2/28-72.6 and currently 69.7 kg. A loss of 2.9 kg/ 4% in 2 weeks.   Medications reviewed and include: Remdesivir completed, Steroids, bronchodilator,   Per chart: CRP, Ferritin and D-Dimer all trending down or improved. Hyponatremia 3/1-132 (L) and Hypokalemia -wnl currently Advanced directives discussed.  Labs: BMP Latest Ref Rng & Units 06/11/2020 06/10/2020 06/08/2020  Glucose 70 - 99 mg/dL 329(J) 242(A) 834(H)  BUN 8 - 23 mg/dL 23 96(Q) 22(L)  Creatinine 0.44 - 1.00 mg/dL 7.98 9.21 1.94  Sodium 135 - 145 mmol/L 132(L) 134(L) 135  Potassium 3.5 - 5.1 mmol/L 4.7 5.2(H) 4.8  Chloride 98 - 111 mmol/L 93(L) 94(L) 94(L)  CO2 22 - 32 mmol/L 28 29 29   Calcium 8.9 - 10.3 mg/dL 8.1(L) 8.7(L) 9.2     Diet Order:   Diet Order            Diet regular Room service appropriate? Yes; Fluid consistency: Thin  Diet effective now                 EDUCATION NEEDS:   Not appropriate for education at this time  Skin:  Skin Assessment:  Reviewed RN Assessment  Last BM:  unknown  Height:   Ht Readings from Last 1 Encounters:  05/31/20 5\' 4"  (1.626 m)    Weight:   Wt Readings from Last 1 Encounters:  06/14/20 69.7 kg    Ideal Body Weight:   55 kg  BMI:  Body mass index is 26.38 kg/m.  Estimated Nutritional Needs:   Kcal:  1812-1960  Protein:  98-112 gr  Fluid:  >2000 ml daily   MS,RD,CSG,LDN Pager: 08/14/20

## 2020-06-13 NOTE — Plan of Care (Signed)

## 2020-06-14 ENCOUNTER — Encounter (HOSPITAL_COMMUNITY): Payer: Self-pay | Admitting: Internal Medicine

## 2020-06-14 DIAGNOSIS — R0603 Acute respiratory distress: Secondary | ICD-10-CM | POA: Diagnosis not present

## 2020-06-14 DIAGNOSIS — J9601 Acute respiratory failure with hypoxia: Secondary | ICD-10-CM | POA: Diagnosis not present

## 2020-06-14 DIAGNOSIS — U071 COVID-19: Secondary | ICD-10-CM | POA: Diagnosis not present

## 2020-06-14 DIAGNOSIS — R0689 Other abnormalities of breathing: Secondary | ICD-10-CM | POA: Diagnosis not present

## 2020-06-14 LAB — BLOOD GAS, ARTERIAL
Acid-base deficit: 1.7 mmol/L (ref 0.0–2.0)
Acid-base deficit: 0.9 mmol/L (ref 0.0–2.0)
Bicarbonate: 21.2 mmol/L (ref 20.0–28.0)
Bicarbonate: 23.3 mmol/L (ref 20.0–28.0)
FIO2: 100
FIO2: 100
O2 Saturation: 86.1 %
O2 Saturation: 98 %
Patient temperature: 37
Patient temperature: 36.8
pCO2 arterial: 60.4 mmHg — ABNORMAL HIGH (ref 32.0–48.0)
pCO2 arterial: 44.6 mmHg (ref 32.0–48.0)
pH, Arterial: 7.237 — ABNORMAL LOW (ref 7.350–7.450)
pH, Arterial: 7.35 (ref 7.350–7.450)
pO2, Arterial: 61.3 mmHg — ABNORMAL LOW (ref 83.0–108.0)
pO2, Arterial: 107 mmHg (ref 83.0–108.0)

## 2020-06-14 LAB — FERRITIN: Ferritin: 1369 ng/mL — ABNORMAL HIGH (ref 11–307)

## 2020-06-14 LAB — C-REACTIVE PROTEIN: CRP: 6 mg/dL — ABNORMAL HIGH (ref <1.0)

## 2020-06-14 LAB — D-DIMER, QUANTITATIVE: D-Dimer, Quant: 3.26 ug/mL-FEU — ABNORMAL HIGH (ref 0.00–0.50)

## 2020-06-14 MED ORDER — IPRATROPIUM-ALBUTEROL 0.5-2.5 (3) MG/3ML IN SOLN
3.0000 mL | Freq: Four times a day (QID) | RESPIRATORY_TRACT | Status: DC
  Administered 2020-06-14 – 2020-06-17 (×12): 3 mL via RESPIRATORY_TRACT
  Filled 2020-06-14 (×11): qty 3

## 2020-06-14 MED ORDER — SODIUM CHLORIDE 0.9 % IV BOLUS
250.0000 mL | Freq: Once | INTRAVENOUS | Status: AC
  Administered 2020-06-14: 250 mL via INTRAVENOUS

## 2020-06-14 MED ORDER — ALPRAZOLAM 0.5 MG PO TABS: 0.5000 mg | ORAL_TABLET | Freq: Two times a day (BID) | ORAL | Status: DC | PRN

## 2020-06-14 MED ORDER — HYDROMORPHONE HCL 1 MG/ML IJ SOLN
0.5000 mg | Freq: Four times a day (QID) | INTRAMUSCULAR | Status: DC | PRN
  Administered 2020-06-14: 0.5 mg via INTRAVENOUS
  Filled 2020-06-14: qty 0.5

## 2020-06-14 MED ORDER — ENSURE ENLIVE PO LIQD: 237.0000 mL | Freq: Three times a day (TID) | ORAL | Status: DC

## 2020-06-14 MED ORDER — IPRATROPIUM-ALBUTEROL 0.5-2.5 (3) MG/3ML IN SOLN
RESPIRATORY_TRACT | Status: AC
  Administered 2020-06-14: 3 mL via RESPIRATORY_TRACT
  Filled 2020-06-14: qty 3

## 2020-06-14 MED ORDER — CHLORHEXIDINE GLUCONATE CLOTH 2 % EX PADS
6.0000 | MEDICATED_PAD | Freq: Every day | CUTANEOUS | Status: DC
  Administered 2020-06-14 – 2020-07-09 (×25): 6 via TOPICAL

## 2020-06-14 MED ORDER — LORAZEPAM 2 MG/ML IJ SOLN
1.0000 mg | Freq: Once | INTRAMUSCULAR | Status: AC
  Administered 2020-06-14: 1 mg via INTRAVENOUS
  Filled 2020-06-14: qty 1

## 2020-06-14 MED ORDER — HALOPERIDOL LACTATE 5 MG/ML IJ SOLN
1.0000 mg | Freq: Four times a day (QID) | INTRAMUSCULAR | Status: DC | PRN
  Administered 2020-06-14: 1 mg via INTRAVENOUS
  Filled 2020-06-14: qty 1

## 2020-06-14 NOTE — Progress Notes (Signed)
Patient had increasing O2 requirement and work of breathing this morning. ABG shows low PO2. Transferring to ICU for heated high flow with BiPAP on stand by.

## 2020-06-14 NOTE — Progress Notes (Signed)
Patient removed BIPAP and was trying to get out of bed. Oxygen dropped to the 60s.  Placed back on BIPAP and increased FIO2 to 100%.  Patient was slow to recover. Dropped FiO2 back down to 90%. RR remains in the 30s to 40s.  HR remains in the 120-130s.    Dr. Camillo Flaming made aware of vitals and agitation.

## 2020-06-14 NOTE — Progress Notes (Signed)
Patient MEWS score is yellow due to increase heart rate. Patient is having difficulty maintaining O2 sat above 90 esp when transferring to [facility]. O2 on monitor read 76. O2 readings are in the 50ish on the vital sign machine. NRB in place to increase patient's O2.  Upon arriving to patient's room, patient is lying prone, HFNC @ 8L/min in place. O2 sensor to  Left ear changed, and replaced on right ear. Patient is c/o severe back pain. PRNs given throughout shift. Patient is taking shallow, short breaths and talking in short phrase d/t the pain. NRB in place to increase patient's O2. MD made aware of changes via secure chat. See new orders.

## 2020-06-14 NOTE — Progress Notes (Signed)
Patient on Bi-pap since arrival to ICU this am. Repeat ABG showed improvement, however pt continues to breathe 30-40 times per minute. O2 readings staying in the upper 90's. Will continue to monitor pt along with respiratory therapy. Dr. Gwenlyn Perking made aware of pt's current status.

## 2020-06-14 NOTE — Progress Notes (Signed)
PROGRESS NOTE  Cecile HearingVickie Shone RUE:[id number] DOB: 04-30-1955 DOA: 05/31/2020 PCP: Tanna FurryZhou-Talbert, Serena S, MD  Brief History: Mrs. Wholey will be admitted to the hospital with a working diagnosis of acute hypoxic respiratory failure due to SARS COVID-19 viral pneumonia.  65 year old female unvaccinated for COVID-19 who presents with 7 days of upper respiratory tract infection symptoms that has progressed into dyspnea and cough for the last 48 hours. Poor appetite and generalized weakness. On her initial physical examination her oximetry was 83% on room air, placed on 6 L per nasal cannula reaching 92%,temperature 38.4 C, blood pressure 130/66, 83/68, respiratory rate 28-31, oxygen saturation 95% on 9 L/min per high flow nasal cannula.She has dry mucous membranes, lungs with rales bilaterally but no wheezing, heart S1-S2, present rhythmic, soft abdomen no extremity edema.  Sodium 131, potassium 3.0, chloride 99, bicarb 23, glucose 106, BUN 12, creatinine 0.79, troponin I 15-18, white count 8.8, hemoglobin 0.9, hematocrit 36.7, platelets 240. SARS COVID-19 positive. Chest radiograph with multilobar interstitial infiltrates, all 4 quadrants, predominantly lower lobes, more right than left.  CT chest with no evidence of pulmonary embolism, bilateral multilobar dense groundglass opacities.  Patient with high inflammatory markers and high oxygen requirementsinitially.tociluzumab 02/19and started on IV steroids.   Assessment/Plan: Acute hypoxemic respiratory failure due to COVID-19 viral pneumonia -Sp tociluzumab 02/19. -going from 15HFNC+NRB>>4LHFNC+NRB>>12LHFNC>>intermittently on 8 L HFNC -finished 5 days remdesivir -continueIV solumedrol to 70 mg bid>>prednisone as pt has had 10 days IV steroids.  Continue oral steroids. -On bronchodilator therapy, antitussive agents and airway clearing techniques. -Continue to encourage prone as tolerated 4 hrs at the time for a  total of 16 hr per day. -She did have elevated D-dimer, but ultrasound of lower extremities and CT chest is negative for VTE.Patient has been receiving DVT prophylaxis since admission (Lovenox). -CRP 21.7>>12.4>>3.5>>2.1>0.9>>0.6--><0.5>>0.7 -Ferritin 1032>>1037>>600>>596>>660>>703>>773>>824>>864 -D-dimer 1.79>.5.68>>3.52>>14.12>>20.00>>19.13>>16.49>>9.71 -Respiratory status decompensated overnight (06/13/2020): With acute component of hypoxemia and hypercapnia leading to requiring BiPAP. -slowly improving; will continue follow response and progression.  Pleuritic back discomfort -Repeat x-ray demonstrating bilateral multifocal changes characteristic for Covid pneumonia -No pulmonary edema or vascular congestion. -Patient afebrile and requiring less oxygen supplementation currently.  No chest pain and not tachycardia. -Will continue PRN low dose analgesic medication and muscle relaxant. -Continue the use of flutter valve and incentive spirometer.  Hyponatremia and hypokalemia due to dehydration. -due to volume depletion and poor solute intake -Overall electrolytes have corrected and volume status has improved  GERD. -continue protonix  Hx nephrolithiasis.  -No clinical signs of exacerbation  Goals of Care Advance care planning, including the explanation and discussion of advance directives was carried out with the patient and family. Code status including explanations of "Full Code" and "DNR" and alternatives were discussed in detail. Discussion of end-of-life issues including but not limited palliative care, hospice care and the concept of hospice, other end-of-life care options, power of attorney for health care decisions, living wills, and physician orders for life-sustaining treatment were also discussed with the patient and family. Total face to face time 16 minutes. -confirmed DNR with patientinitially, but after speaking with spouse, she and spouse changed minds and  wanted full scope of care   Status is: Inpatient   Dispo: The patient is from:Home Anticipated d/c is JY:NWGNto:Home Anticipated d/c date is: > 3 days Patient currently is not medically stable to d/c. Difficult to place patient No     Family Communication:spouse updated 3/1  Consultants:none  Code Status:DNR  DVT  Prophylaxis:[place] Lovenox   Procedures: As Listed in Progress Note Above  Antibiotics: None   Subjective: Overnight with for decompensation of respiratory status requiring the use of BiPAP. No fever, no chest pain, no nausea, no vomiting.  Objective: Vitals:   06/14/20 0521 06/14/20 0659 06/14/20 0800 06/14/20 0900  BP:   (!) 139/95 130/88  Pulse:   98 95  Resp: (!) 24  (!) 23 (!) 29  Temp:   97.8 F (36.6 C)   TempSrc:      SpO2:  94% 96% 95%  Weight:    69.7 kg  Height:        Intake/Output Summary (Last 24 hours) at 06/14/2020 0920 Last data filed at 06/13/2020 2127 Gross per 24 hour  Intake 450 ml  Output --  Net 450 ml   Weight change:   Exam: General exam: Alert, awake, oriented x 3; currently on BiPAP; T9 chest pain, nausea or vomiting. Patient is afebrile. Respiratory system: Positive rhonchi bilaterally; no expiratory wheezes. Appreciated tachypnea with minimal exertion. Cardiovascular system:RRR. No murmurs, rubs, gallops. Gastrointestinal system: Abdomen is nondistended, soft and nontender. No organomegaly or masses felt. Normal bowel sounds heard. Central nervous system: Alert and oriented. No focal neurological deficits. Extremities: No C/C/E, +pedal pulses Skin: No rashes, no petechiae. Psychiatry: Judgement and insight appear normal. Slightly anxious with ongoing clinical course, BIPAP use and resp decompensation.  Data Reviewed: I have personally reviewed following labs and imaging studies  Basic Metabolic Panel: Recent Labs  Lab 06/08/20 0649 06/10/20 0603  06/11/20 0629  NA 135 134* 132*  K 4.8 5.2* 4.7  CL 94* 94* 93*  CO2 29 29 28   GLUCOSE 140* 175* 150*  BUN 30* 26* 23  CREATININE 0.79 0.71 0.62  CALCIUM 9.2 8.7* 8.1*   Liver Function Tests: Recent Labs  Lab 06/08/20 0649 06/10/20 0603 06/11/20 0629  AST 37 26 27  ALT 28 24 21   ALKPHOS 70 63 61  BILITOT 1.0 0.7 0.9  PROT 6.2* 5.5* 5.9*  ALBUMIN 3.3* 3.0* 3.3*   CBC: Recent Labs  Lab 06/08/20 0649 06/10/20 0603 06/11/20 0629  WBC 18.1* 15.0* 16.7*  HGB 14.3 13.3 13.9  HCT 43.9 41.1 43.3  MCV 88.9 89.3 89.1  PLT 174 131* 155   Scheduled Meds: . Chlorhexidine Gluconate Cloth  6 each Topical Daily  . chlorpheniramine-HYDROcodone  5 mL Oral Q12H  . enoxaparin (LOVENOX) injection  40 mg Subcutaneous Q24H  . Ipratropium-Albuterol  1 puff Inhalation TID  . ipratropium-albuterol  3 mL Nebulization Q6H  . lidocaine  1 patch Transdermal Q24H  . oxymetazoline  1 spray Each Nare BID  . pantoprazole  40 mg Oral Daily  . polyethylene glycol  17 g Oral BID  . predniSONE  60 mg Oral Q breakfast  . senna  2 tablet Oral Daily   Continuous Infusions:  Procedures/Studies: DG Chest 1 View  Result Date: 06/03/2020 CLINICAL DATA:  COVID 19 pneumonia. EXAM: CHEST  1 VIEW COMPARISON:  CT a chest in chest x-ray dated May 31, 2020. FINDINGS: Unchanged mild cardiomegaly. Diffuse bilateral interstitial and patchy bibasilar airspace opacities are not significantly changed. No pleural effusion or pneumothorax. No acute osseous abnormality. IMPRESSION: 1. Unchanged multifocal COVID-19 pneumonia. Electronically Signed   By: 06/05/2020 M.D.   On: 06/03/2020 12:31   CT Angio Chest PE W/Cm &/Or Wo Cm  Result Date: 05/31/2020 CLINICAL DATA:  Shortness of breath.  COVID-19 positive. EXAM: CT ANGIOGRAPHY CHEST WITH CONTRAST TECHNIQUE: Multidetector CT  imaging of the chest was performed using the standard protocol during bolus administration of intravenous contrast. Multiplanar CT image  reconstructions and MIPs were obtained to evaluate the vascular anatomy. CONTRAST:  61mL OMNIPAQUE IOHEXOL 350 MG/ML SOLN COMPARISON:  None. FINDINGS: Cardiovascular: Satisfactory opacification of the pulmonary arteries to the segmental level. No evidence of pulmonary embolism. Mild cardiomegaly. No pericardial effusion. Atherosclerosis of thoracic aorta is noted without aneurysm formation. Mediastinum/Nodes: No enlarged mediastinal, hilar, or axillary lymph nodes. Thyroid gland, trachea, and esophagus demonstrate no significant findings. Lungs/Pleura: No pneumothorax or pleural effusion is noted. Patchy airspace opacities are noted throughout both lungs consistent with multifocal pneumonia. Upper Abdomen: No acute abnormality. Musculoskeletal: No chest wall abnormality. No acute or significant osseous findings. Review of the MIP images confirms the above findings. IMPRESSION: 1. No definite evidence of pulmonary embolus. 2. Patchy airspace opacities are noted throughout both lungs consistent with multifocal pneumonia. 3. Aortic atherosclerosis. Aortic Atherosclerosis (ICD10-I70.0). Electronically Signed   By: Lupita Raider M.D.   On: 05/31/2020 14:32   US Venous Img Lower Bilateral (DVT)  Result Date: 06/03/2020 CLINICAL DATA:  Elevated D-dimer. Former smoker. Patient is currently on anticoagulation. Evaluate for DVT. EXAM: BILATERAL LOWER EXTREMITY VENOUS DOPPLER ULTRASOUND TECHNIQUE: Gray-scale sonography with graded compression, as well as color Doppler and duplex ultrasound were performed to evaluate the lower extremity deep venous systems from the level of the common femoral vein and including the common femoral, femoral, profunda femoral, popliteal and calf veins including the posterior tibial, peroneal and gastrocnemius veins when visible. The superficial great saphenous vein was also interrogated. Spectral Doppler was utilized to evaluate flow at rest and with distal augmentation maneuvers in the  common femoral, femoral and popliteal veins. COMPARISON:  None. FINDINGS: RIGHT LOWER EXTREMITY Common Femoral Vein: No evidence of thrombus. Normal compressibility, respiratory phasicity and response to augmentation. Saphenofemoral Junction: No evidence of thrombus. Normal compressibility and flow on color Doppler imaging. Profunda Femoral Vein: No evidence of thrombus. Normal compressibility and flow on color Doppler imaging. Femoral Vein: No evidence of thrombus. Normal compressibility, respiratory phasicity and response to augmentation. Popliteal Vein: No evidence of thrombus. Normal compressibility, respiratory phasicity and response to augmentation. Calf Veins: No evidence of thrombus. Normal compressibility and flow on color Doppler imaging. Superficial Great Saphenous Vein: No evidence of thrombus. Normal compressibility. Venous Reflux:  None. Other Findings: Note is made of an approximately 2.2 x 1.2 x 1.1 cm anechoic fluid collection within the right popliteal fossa compatible with a Baker's cyst. LEFT LOWER EXTREMITY Common Femoral Vein: No evidence of thrombus. Normal compressibility, respiratory phasicity and response to augmentation. Saphenofemoral Junction: No evidence of thrombus. Normal compressibility and flow on color Doppler imaging. Profunda Femoral Vein: No evidence of thrombus. Normal compressibility and flow on color Doppler imaging. Femoral Vein: No evidence of thrombus. Normal compressibility, respiratory phasicity and response to augmentation. Popliteal Vein: No evidence of thrombus. Normal compressibility, respiratory phasicity and response to augmentation. Calf Veins: No evidence of thrombus. Normal compressibility and flow on color Doppler imaging. Superficial Great Saphenous Vein: No evidence of thrombus. Normal compressibility. Venous Reflux:  None. Other Findings:  None. IMPRESSION: 1. No evidence of DVT within either lower extremity. 2. Incidental note made of an approximately 2.2  cm right-sided Baker's cyst. Electronically Signed   By: Simonne Come M.D.   On: 06/03/2020 15:42   DG CHEST PORT 1 VIEW  Result Date: 06/13/2020 CLINICAL DATA:  Worsening shortness of breath, COVID-19 positive on 05/31/2020, former smoker EXAM: PORTABLE  CHEST 1 VIEW COMPARISON:  Portable exam 0850 hours compared to 06/03/2020 FINDINGS: Normal heart size mediastinal contours. Patchy BILATERAL pulmonary infiltrates consistent with multifocal pneumonia and COVID-19, increased from previous exam. No pleural effusion or pneumothorax. No acute osseous findings. IMPRESSION: Increased BILATERAL pulmonary infiltrates consistent with multifocal pneumonia and COVID-19. Electronically Signed   By: Ulyses Southward M.D.   On: 06/13/2020 11:06   DG Chest Port 1 View  Result Date: 05/31/2020 CLINICAL DATA:  Shortness of breath. EXAM: PORTABLE CHEST 1 VIEW COMPARISON:  March 25, 2009. FINDINGS: The heart size and mediastinal contours are within normal limits. No pneumothorax or pleural effusion is noted. Patchy airspace opacities are noted throughout both lungs consistent with multifocal pneumonia. The visualized skeletal structures are unremarkable. IMPRESSION: Bilateral multifocal pneumonia. Electronically Signed   By: Lupita Raider M.D.   On: 05/31/2020 13:24    Vassie Loll, MD  [facility]  Time: 35 minutes.  If 7PM-7AM, please contact night-coverage [email or web address] Password [email or web address] 06/14/2020, 9:20 AM   LOS: 14 days

## 2020-06-15 ENCOUNTER — Inpatient Hospital Stay (HOSPITAL_COMMUNITY): Payer: BLUE CROSS/BLUE SHIELD

## 2020-06-15 DIAGNOSIS — J9601 Acute respiratory failure with hypoxia: Secondary | ICD-10-CM | POA: Diagnosis not present

## 2020-06-15 DIAGNOSIS — R0689 Other abnormalities of breathing: Secondary | ICD-10-CM | POA: Diagnosis not present

## 2020-06-15 DIAGNOSIS — J1282 Pneumonia due to coronavirus disease 2019: Secondary | ICD-10-CM | POA: Diagnosis not present

## 2020-06-15 DIAGNOSIS — J9690 Respiratory failure, unspecified, unspecified whether with hypoxia or hypercapnia: Secondary | ICD-10-CM | POA: Diagnosis present

## 2020-06-15 DIAGNOSIS — U071 COVID-19: Secondary | ICD-10-CM | POA: Diagnosis not present

## 2020-06-15 DIAGNOSIS — R0603 Acute respiratory distress: Secondary | ICD-10-CM | POA: Diagnosis not present

## 2020-06-15 LAB — CBC WITH DIFFERENTIAL/PLATELET
Abs Immature Granulocytes: 0.24 10*3/uL — ABNORMAL HIGH (ref 0.00–0.07)
Basophils Absolute: 0.1 10*3/uL (ref 0.0–0.1)
Basophils Relative: 0 %
Eosinophils Absolute: 0.1 10*3/uL (ref 0.0–0.5)
Eosinophils Relative: 0 %
HCT: 42 % (ref 36.0–46.0)
Hemoglobin: 13.3 g/dL (ref 12.0–15.0)
Immature Granulocytes: 1 %
Lymphocytes Relative: 1 %
Lymphs Abs: 0.3 10*3/uL — ABNORMAL LOW (ref 0.7–4.0)
MCH: 28.6 pg (ref 26.0–34.0)
MCHC: 31.7 g/dL (ref 30.0–36.0)
MCV: 90.3 fL (ref 80.0–100.0)
Monocytes Absolute: 0.6 10*3/uL (ref 0.1–1.0)
Monocytes Relative: 3 %
Neutro Abs: 21.6 10*3/uL — ABNORMAL HIGH (ref 1.7–7.7)
Neutrophils Relative %: 95 %
Platelets: 201 10*3/uL (ref 150–400)
RBC: 4.65 MIL/uL (ref 3.87–5.11)
RDW: 14.8 % (ref 11.5–15.5)
WBC: 22.9 10*3/uL — ABNORMAL HIGH (ref 4.0–10.5)
nRBC: 0 % (ref 0.0–0.2)

## 2020-06-15 LAB — BLOOD GAS, ARTERIAL
Acid-Base Excess: 0.2 mmol/L (ref 0.0–2.0)
Acid-Base Excess: 2.5 mmol/L — ABNORMAL HIGH (ref 0.0–2.0)
Bicarbonate: 23.3 mmol/L (ref 20.0–28.0)
Bicarbonate: 24.2 mmol/L (ref 20.0–28.0)
FIO2: 100
FIO2: 85
O2 Saturation: 90.1 %
O2 Saturation: 93.8 %
Patient temperature: 37
Patient temperature: 38.7
pCO2 arterial: 56.6 mmHg — ABNORMAL HIGH (ref 32.0–48.0)
pCO2 arterial: 70.7 mmHg (ref 32.0–48.0)
pH, Arterial: 7.246 — ABNORMAL LOW (ref 7.350–7.450)
pH, Arterial: 7.285 — ABNORMAL LOW (ref 7.350–7.450)
pO2, Arterial: 60.5 mmHg — ABNORMAL LOW (ref 83.0–108.0)
pO2, Arterial: 86.6 mmHg (ref 83.0–108.0)

## 2020-06-15 LAB — COMPREHENSIVE METABOLIC PANEL
ALT: 20 U/L (ref 0–44)
AST: 31 U/L (ref 15–41)
Albumin: 2.5 g/dL — ABNORMAL LOW (ref 3.5–5.0)
Alkaline Phosphatase: 98 U/L (ref 38–126)
Anion gap: 12 (ref 5–15)
BUN: 33 mg/dL — ABNORMAL HIGH (ref 8–23)
CO2: 20 mmol/L — ABNORMAL LOW (ref 22–32)
Calcium: 7.6 mg/dL — ABNORMAL LOW (ref 8.9–10.3)
Chloride: 104 mmol/L (ref 98–111)
Creatinine, Ser: 0.84 mg/dL (ref 0.44–1.00)
GFR, Estimated: >60 mL/min (ref >60)
Glucose, Bld: 196 mg/dL — ABNORMAL HIGH (ref 70–99)
Potassium: 4.6 mmol/L (ref 3.5–5.1)
Sodium: 136 mmol/L (ref 135–145)
Total Bilirubin: 1.5 mg/dL — ABNORMAL HIGH (ref 0.3–1.2)
Total Protein: 5.4 g/dL — ABNORMAL LOW (ref 6.5–8.1)

## 2020-06-15 LAB — POCT I-STAT 7, (LYTES, BLD GAS, ICA,H+H)
Acid-Base Excess: 0 mmol/L (ref 0.0–2.0)
Acid-base deficit: 2 mmol/L (ref 0.0–2.0)
Bicarbonate: 29 mmol/L — ABNORMAL HIGH (ref 20.0–28.0)
Bicarbonate: 28.4 mmol/L — ABNORMAL HIGH (ref 20.0–28.0)
Calcium, Ion: 1.12 mmol/L — ABNORMAL LOW (ref 1.15–1.40)
Calcium, Ion: 1.12 mmol/L — ABNORMAL LOW (ref 1.15–1.40)
HCT: 41 % (ref 36.0–46.0)
HCT: 38 % (ref 36.0–46.0)
Hemoglobin: 13.9 g/dL (ref 12.0–15.0)
Hemoglobin: 12.9 g/dL (ref 12.0–15.0)
O2 Saturation: 95 %
O2 Saturation: 98 %
Patient temperature: 100.8
Patient temperature: 98.3
Potassium: 4.6 mmol/L (ref 3.5–5.1)
Potassium: 4.4 mmol/L (ref 3.5–5.1)
Sodium: 135 mmol/L (ref 135–145)
Sodium: 136 mmol/L (ref 135–145)
TCO2: 31 mmol/L (ref 22–32)
TCO2: 30 mmol/L (ref 22–32)
pCO2 arterial: 81.8 mmHg (ref 32.0–48.0)
pCO2 arterial: 63.2 mmHg — ABNORMAL HIGH (ref 32.0–48.0)
pH, Arterial: 7.164 — CL (ref 7.350–7.450)
pH, Arterial: 7.26 — ABNORMAL LOW (ref 7.350–7.450)
pO2, Arterial: 105 mmHg (ref 83.0–108.0)
pO2, Arterial: 129 mmHg — ABNORMAL HIGH (ref 83.0–108.0)

## 2020-06-15 LAB — MAGNESIUM: Magnesium: 3.1 mg/dL — ABNORMAL HIGH (ref 1.7–2.4)

## 2020-06-15 LAB — LACTIC ACID, PLASMA: Lactic Acid, Venous: 1.6 mmol/L (ref 0.5–1.9)

## 2020-06-15 LAB — GLUCOSE, CAPILLARY
Glucose-Capillary: 193 mg/dL — ABNORMAL HIGH (ref 70–99)
Glucose-Capillary: 194 mg/dL — ABNORMAL HIGH (ref 70–99)

## 2020-06-15 LAB — PROTIME-INR
INR: 1.1 (ref 0.8–1.2)
Prothrombin Time: 13.9 seconds (ref 11.4–15.2)

## 2020-06-15 LAB — C-REACTIVE PROTEIN: CRP: 21.2 mg/dL — ABNORMAL HIGH (ref <1.0)

## 2020-06-15 LAB — PHOSPHORUS: Phosphorus: 2.9 mg/dL (ref 2.5–4.6)

## 2020-06-15 LAB — MRSA PCR SCREENING: MRSA by PCR: NEGATIVE

## 2020-06-15 LAB — APTT: aPTT: 24 seconds (ref 24–36)

## 2020-06-15 LAB — D-DIMER, QUANTITATIVE: D-Dimer, Quant: 2.15 ug/mL-FEU — ABNORMAL HIGH (ref 0.00–0.50)

## 2020-06-15 LAB — LIPASE, BLOOD: Lipase: 20 U/L (ref 11–51)

## 2020-06-15 LAB — AMYLASE: Amylase: 87 U/L (ref 28–100)

## 2020-06-15 LAB — FERRITIN: Ferritin: 1310 ng/mL — ABNORMAL HIGH (ref 11–307)

## 2020-06-15 MED ORDER — PANTOPRAZOLE SODIUM 40 MG IV SOLR
40.0000 mg | INTRAVENOUS | Status: DC
  Administered 2020-06-15: 40 mg via INTRAVENOUS
  Filled 2020-06-15: qty 40

## 2020-06-15 MED ORDER — DOCUSATE SODIUM 50 MG/5ML PO LIQD: 100.0000 mg | Freq: Two times a day (BID) | ORAL | Status: DC

## 2020-06-15 MED ORDER — FENTANYL CITRATE (PF) 100 MCG/2ML IJ SOLN
50.0000 ug | Freq: Once | INTRAMUSCULAR | Status: AC
  Administered 2020-06-15: 50 ug via INTRAVENOUS
  Filled 2020-06-15: qty 2

## 2020-06-15 MED ORDER — MIDAZOLAM HCL 2 MG/2ML IJ SOLN
2.0000 mg | INTRAMUSCULAR | Status: DC | PRN
  Administered 2020-06-15: 2 mg via INTRAVENOUS
  Filled 2020-06-15: qty 2

## 2020-06-15 MED ORDER — SODIUM CHLORIDE 0.9 % IV SOLN: INTRAVENOUS | Status: DC

## 2020-06-15 MED ORDER — POLYETHYLENE GLYCOL 3350 17 G PO PACK: 17.0000 g | PACK | Freq: Every day | ORAL | Status: DC

## 2020-06-15 MED ORDER — ORAL CARE MOUTH RINSE
15.0000 mL | OROMUCOSAL | Status: DC
  Administered 2020-06-15 – 2020-07-09 (×240): 15 mL via OROMUCOSAL

## 2020-06-15 MED ORDER — NOREPINEPHRINE 4 MG/250ML-% IV SOLN: 2.0000 ug/min | INTRAVENOUS | Status: DC

## 2020-06-15 MED ORDER — METHYLPREDNISOLONE SODIUM SUCC 125 MG IJ SOLR
60.0000 mg | Freq: Two times a day (BID) | INTRAMUSCULAR | Status: DC
  Administered 2020-06-15 – 2020-06-16 (×3): 60 mg via INTRAVENOUS
  Filled 2020-06-15 (×3): qty 2

## 2020-06-15 MED ORDER — PROPOFOL 1000 MG/100ML IV EMUL
0.0000 ug/kg/min | INTRAVENOUS | Status: DC
  Administered 2020-06-15: 5 ug/kg/min via INTRAVENOUS
  Administered 2020-06-16 (×2): 20 ug/kg/min via INTRAVENOUS
  Administered 2020-06-17: 25 ug/kg/min via INTRAVENOUS
  Filled 2020-06-15 (×3): qty 100

## 2020-06-15 MED ORDER — DEXMEDETOMIDINE HCL IN NACL 400 MCG/100ML IV SOLN
0.4000 ug/kg/h | INTRAVENOUS | Status: DC
  Administered 2020-06-15: 0.4 ug/kg/h via INTRAVENOUS
  Filled 2020-06-15: qty 100

## 2020-06-15 MED ORDER — DOCUSATE SODIUM 100 MG PO CAPS: 100.0000 mg | ORAL_CAPSULE | Freq: Two times a day (BID) | ORAL | Status: DC | PRN

## 2020-06-15 MED ORDER — PIPERACILLIN-TAZOBACTAM 3.375 G IVPB
3.3750 g | Freq: Three times a day (TID) | INTRAVENOUS | Status: DC
  Administered 2020-06-15 (×2): 3.375 g via INTRAVENOUS
  Filled 2020-06-15 (×2): qty 50

## 2020-06-15 MED ORDER — METHYLPREDNISOLONE SODIUM SUCC 40 MG IJ SOLR
40.0000 mg | Freq: Two times a day (BID) | INTRAMUSCULAR | Status: DC
  Administered 2020-06-15: 40 mg via INTRAVENOUS
  Filled 2020-06-15: qty 1

## 2020-06-15 MED ORDER — POLYETHYLENE GLYCOL 3350 17 G PO PACK
17.0000 g | PACK | Freq: Every day | ORAL | Status: DC
  Administered 2020-06-15 – 2020-06-17 (×2): 17 g
  Filled 2020-06-15: qty 1

## 2020-06-15 MED ORDER — PANTOPRAZOLE SODIUM 40 MG IV SOLR
40.0000 mg | Freq: Every day | INTRAVENOUS | Status: DC
  Administered 2020-06-15 – 2020-06-16 (×2): 40 mg via INTRAVENOUS
  Filled 2020-06-15 (×2): qty 40

## 2020-06-15 MED ORDER — SODIUM CHLORIDE 0.9 % IV SOLN: 250.0000 mL | INTRAVENOUS | Status: DC

## 2020-06-15 MED ORDER — POLYETHYLENE GLYCOL 3350 17 G PO PACK: 17.0000 g | PACK | Freq: Every day | ORAL | Status: DC | PRN

## 2020-06-15 MED ORDER — PROPOFOL 1000 MG/100ML IV EMUL
5.0000 ug/kg/min | INTRAVENOUS | Status: DC
  Filled 2020-06-15: qty 100

## 2020-06-15 MED ORDER — ENOXAPARIN SODIUM 30 MG/0.3ML ~~LOC~~ SOLN: 30.0000 mg | SUBCUTANEOUS | Status: DC

## 2020-06-15 MED ORDER — SODIUM CHLORIDE 0.9 % IV SOLN
2.0000 g | Freq: Three times a day (TID) | INTRAVENOUS | Status: DC
  Administered 2020-06-16 – 2020-06-17 (×4): 2 g via INTRAVENOUS
  Filled 2020-06-15 (×4): qty 2

## 2020-06-15 MED ORDER — MIDAZOLAM HCL 2 MG/2ML IJ SOLN: 2.0000 mg | INTRAMUSCULAR | Status: DC | PRN

## 2020-06-15 MED ORDER — NOREPINEPHRINE 4 MG/250ML-% IV SOLN
2.0000 ug/min | INTRAVENOUS | Status: DC
  Administered 2020-06-15: 2 ug/min via INTRAVENOUS
  Filled 2020-06-15: qty 250

## 2020-06-15 MED ORDER — SODIUM CHLORIDE 0.9 % IV SOLN
250.0000 mL | INTRAVENOUS | Status: DC
  Administered 2020-06-15 – 2020-06-29 (×3): 250 mL via INTRAVENOUS

## 2020-06-15 MED ORDER — CHLORHEXIDINE GLUCONATE 0.12% ORAL RINSE (MEDLINE KIT)
15.0000 mL | Freq: Two times a day (BID) | OROMUCOSAL | Status: DC
  Administered 2020-06-15 – 2020-07-09 (×49): 15 mL via OROMUCOSAL

## 2020-06-15 MED ORDER — VANCOMYCIN HCL 750 MG/150ML IV SOLN
750.0000 mg | Freq: Two times a day (BID) | INTRAVENOUS | Status: DC
  Administered 2020-06-15: 750 mg via INTRAVENOUS
  Filled 2020-06-15 (×2): qty 150

## 2020-06-15 MED ORDER — FENTANYL BOLUS VIA INFUSION
50.0000 ug | INTRAVENOUS | Status: DC | PRN
  Administered 2020-06-15 (×5): 50 ug via INTRAVENOUS
  Filled 2020-06-15: qty 50

## 2020-06-15 MED ORDER — MIDAZOLAM HCL 2 MG/2ML IJ SOLN
2.0000 mg | INTRAMUSCULAR | Status: DC | PRN
  Filled 2020-06-15: qty 2

## 2020-06-15 MED ORDER — FENTANYL 2500MCG IN NS 250ML (10MCG/ML) PREMIX INFUSION
50.0000 ug/h | INTRAVENOUS | Status: DC
  Administered 2020-06-15: 50 ug/h via INTRAVENOUS
  Administered 2020-06-16 (×2): 200 ug/h via INTRAVENOUS
  Administered 2020-06-17: 100 ug/h via INTRAVENOUS
  Administered 2020-06-17 – 2020-06-18 (×2): 200 ug/h via INTRAVENOUS
  Administered 2020-06-18: 125 ug/h via INTRAVENOUS
  Administered 2020-06-19 – 2020-06-21 (×5): 200 ug/h via INTRAVENOUS
  Administered 2020-06-22: 150 ug/h via INTRAVENOUS
  Administered 2020-06-22: 200 ug/h via INTRAVENOUS
  Administered 2020-06-23: 150 ug/h via INTRAVENOUS
  Administered 2020-06-24 – 2020-06-26 (×3): 100 ug/h via INTRAVENOUS
  Administered 2020-06-28: 50 ug/h via INTRAVENOUS
  Administered 2020-06-30: 25 ug/h via INTRAVENOUS
  Administered 2020-07-01 – 2020-07-02 (×3): 100 ug/h via INTRAVENOUS
  Administered 2020-07-03: 75 ug/h via INTRAVENOUS
  Administered 2020-07-04: 100 ug/h via INTRAVENOUS
  Filled 2020-06-15 (×24): qty 250

## 2020-06-15 MED ORDER — DOCUSATE SODIUM 50 MG/5ML PO LIQD
100.0000 mg | Freq: Two times a day (BID) | ORAL | Status: DC
  Administered 2020-06-15 – 2020-06-16 (×2): 100 mg
  Filled 2020-06-15 (×3): qty 10

## 2020-06-15 MED ORDER — PROPOFOL 1000 MG/100ML IV EMUL
INTRAVENOUS | Status: AC
  Administered 2020-06-15: 10 ug/kg/min
  Filled 2020-06-15: qty 100

## 2020-06-15 MED ORDER — MIDAZOLAM HCL 2 MG/2ML IJ SOLN
2.0000 mg | INTRAMUSCULAR | Status: DC | PRN
  Administered 2020-06-15 – 2020-06-17 (×3): 2 mg via INTRAVENOUS
  Filled 2020-06-15 (×2): qty 2

## 2020-06-15 MED ORDER — PROPOFOL 1000 MG/100ML IV EMUL
5.0000 ug/kg/min | INTRAVENOUS | Status: DC
  Administered 2020-06-15 (×2): 80 ug/kg/min via INTRAVENOUS
  Administered 2020-06-15: 45 ug/kg/min via INTRAVENOUS
  Filled 2020-06-15: qty 100
  Filled 2020-06-15: qty 200

## 2020-06-15 MED ORDER — FENTANYL 2500MCG IN NS 250ML (10MCG/ML) PREMIX INFUSION: 50.0000 ug/h | INTRAVENOUS | Status: DC

## 2020-06-15 MED ORDER — VANCOMYCIN HCL 1500 MG/300ML IV SOLN
1500.0000 mg | Freq: Once | INTRAVENOUS | Status: AC
  Administered 2020-06-15: 1500 mg via INTRAVENOUS
  Filled 2020-06-15: qty 300

## 2020-06-15 MED ORDER — FENTANYL BOLUS VIA INFUSION
50.0000 ug | INTRAVENOUS | Status: DC | PRN
  Administered 2020-06-22 – 2020-07-02 (×20): 50 ug via INTRAVENOUS
  Filled 2020-06-15: qty 50

## 2020-06-15 MED ORDER — FENTANYL CITRATE (PF) 100 MCG/2ML IJ SOLN
50.0000 ug | Freq: Once | INTRAMUSCULAR | Status: DC
Start: 2020-06-15 — End: 2020-06-17

## 2020-06-15 NOTE — Progress Notes (Signed)
Second report called to new [facility] team. Expected arrival within 15-30 minutes.

## 2020-06-15 NOTE — Progress Notes (Signed)
Report called to [facility]. [facility] called back and stated there is a delay and it may be a different crew coming and may need a repeat report. Per [facility] request, sedation switched from propofol and precedex to Fentanyl and Levophed was started.

## 2020-06-15 NOTE — Progress Notes (Signed)
Pharmacy Antibiotic Note  Rachel [name] is a 65 y.o. female admitted on 05/31/2020 with pneumonia.  Pharmacy has been consulted for Vancomycin and zosyn dosing.  Plan: Vancomycin 1500mg  loading dose, then 750mg  IV every 12 hours.  Goal trough 15-20 mcg/mL. Zosyn 3.375g IV q8h (4 hour infusion).  F/U cxs and clinical progress\ Monitor V/S, labs and levels as indicated  Height: 5\' 4"  (162.6 cm) Weight: 69.7 kg (153 lb 10.6 oz) IBW/kg (Calculated) : 54.7  Temp (24hrs), Avg:101.4 F (38.6 C), Min:97.9 F (36.6 C), Max:102.02 F (38.9 C)  Recent Labs  Lab 06/10/20 0603 06/11/20 0629  WBC 15.0* 16.7*  CREATININE 0.71 0.62    Estimated Creatinine Clearance: 69 mL/min (by C-G formula based on SCr of 0.62 mg/dL).    No Known Allergies  Antimicrobials this admission: Vancomycin 3/5 >> Zosyn 3/5 >>   Dose adjustments this admission: prn  Microbiology results: 3/5 BCx: pending 3/5 UCx: pending 3/5 MRSA PCR:  2/18 SARS2-CV positive  Thank you for allowing pharmacy to be a part of this patient's care.  06/12/20, BS Pharm D, BCPS Clinical Pharmacist Pager [phone] 06/15/2020 8:20 AM

## 2020-06-15 NOTE — Progress Notes (Signed)
PROGRESS NOTE  Tanganika Barradas FAO:[id number] DOB: 01/18/1956 DOA: 05/31/2020 PCP: Alfonse Flavors, MD  Brief History: Mrs. Fettes will be admitted to the hospital with a working diagnosis of acute hypoxic respiratory failure due to SARS COVID-19 viral pneumonia.  65 year old female unvaccinated for COVID-19 who presents with 7 days of upper respiratory tract infection symptoms that has progressed into dyspnea and cough for the last 48 hours. Poor appetite and generalized weakness. On her initial physical examination her oximetry was 83% on room air, placed on 6 L per nasal cannula reaching 92%,temperature 38.4 C, blood pressure 130/66, 83/68, respiratory rate 28-31, oxygen saturation 95% on 9 L/min per high flow nasal cannula.She has dry mucous membranes, lungs with rales bilaterally but no wheezing, heart S1-S2, present rhythmic, soft abdomen no extremity edema.  Sodium 131, potassium 3.0, chloride 99, bicarb 23, glucose 106, BUN 12, creatinine 0.79, troponin I 15-18, white count 8.8, hemoglobin 0.9, hematocrit 36.7, platelets 240. SARS COVID-19 positive. Chest radiograph with multilobar interstitial infiltrates, all 4 quadrants, predominantly lower lobes, more right than left.  CT chest with no evidence of pulmonary embolism, bilateral multilobar dense groundglass opacities.  Patient with high inflammatory markers and high oxygen requirementsinitially.tociluzumab 02/19and started on IV steroids.   Assessment/Plan: Acute hypoxemic/hypercapnic respiratory failure due to COVID-19 viral pneumonia and HCAP -Sp tociluzumab 02/19. -going from 15HFNC+NRB>>4LHFNC+NRB>>12LHFNC>>intermittently on 8 L HFNC -finished 5 days remdesivir -continue bronchodilator therapy, antitussive agents and airway clearing techniques. -with concerns for HCAP now, given fever, elevation WBC's and worsening CXR findings. -will start broad spectrum antibiotics -patient is now  intubated and mechanically ventilated -will check lactic acid and follow response -transferring to [facility] for critical care and most likely bronchoscopy evaluation. -She did have elevated D-dimer, but ultrasound of lower extremities and CT chest is negative for VTE.Patient has been receiving DVT prophylaxis since admission (Lovenox). -CRP 21.7>>12.4>>3.5>>2.1>0.9>>0.6--><0.5>>0.7 -follow ABG  Hypotension -due to sedation -will follow VS -continue IVF's  Pleuritic back discomfort -Repeat x-ray demonstrating bilateral multifocal changes characteristic for Covid pneumonia -No pulmonary edema or vascular congestion. -PRN analgesics and muscle relaxant ordered initially; now on hold given soft BP.  Hyponatremia and hypokalemia due to dehydration. -due to volume depletion and poor solute intake -Overall electrolytes have corrected and volume status has improved  GERD. -continue protonix  Hx nephrolithiasis.  -No clinical signs of exacerbation  Goals of Care Advance care planning, including the explanation and discussion of advance directives was carried out with the patient and family. Patient is full code, and all interventions wanted.   Status is: Inpatient   Dispo: The patient is from:Home Anticipated d/c is ON:GEXB Anticipated d/c date is: > 3 days Patient currently is not medically stable to d/c. Will transfer to [facility] for critical care/intensivist approach. She might need bronchoscopy evaluation. Difficult to place patient No     Family Communication:spouse updated 3/1  Consultants:none  Code Status:DNR  DVT Prophylaxis:[place]ke Lovenox   Procedures: As Listed in Progress Note Above  Antibiotics: None   Subjective: Febrile, with further respiratory decompensation overnight requiring intubation and mechanically ventilatory support.  Objective: Vitals:   06/15/20 1040 06/15/20 1100  06/15/20 1130 06/15/20 1200  BP: (!) 71/50 (!) 65/42 (!) 76/50 (!) 78/52  Pulse: (!) 117 (!) 117 (!) 108 (!) 109  Resp: (!) 27 (!) 26 (!) 23 (!) 24  Temp: (!) 102.38 F (39.1 C) (!) 102.9 F (39.4 C) (!) 102.38 F (39.1 C) (!) 101.66 F (38.7 C)  TempSrc:      SpO2: 93% 93% 94% 92%  Weight:      Height:        Intake/Output Summary (Last 24 hours) at 06/15/2020 1216 Last data filed at 06/15/2020 0533 Gross per 24 hour  Intake 100 ml  Output 850 ml  Net -750 ml   Weight change:   Exam: General exam: febrile, sedated and mechanically ventilated. Respiratory system: diffuse bilateral rhonchi, positive tachypnea. No wheezing. Cardiovascular system:sinus tachycardia, no rubs, no gallops, no JVD.  Gastrointestinal system: Abdomen is nondistended, soft and nontender. No organomegaly or masses felt. Normal bowel sounds heard. Central nervous system: unable to assess with current sedation. Extremities: No cyanosis, no clubbing. Skin: No rashes, no petechiae. Psychiatry: unable to assess with current sedation.   Data Reviewed: I have personally reviewed following labs and imaging studies  Basic Metabolic Panel: Recent Labs  Lab 06/10/20 0603 06/11/20 0629  NA 134* 132*  K 5.2* 4.7  CL 94* 93*  CO2 29 28  GLUCOSE 175* 150*  BUN 26* 23  CREATININE 0.71 0.62  CALCIUM 8.7* 8.1*   Liver Function Tests: Recent Labs  Lab 06/10/20 0603 06/11/20 0629  AST 26 27  ALT 24 21  ALKPHOS 63 61  BILITOT 0.7 0.9  PROT 5.5* 5.9*  ALBUMIN 3.0* 3.3*   CBC: Recent Labs  Lab 06/10/20 0603 06/11/20 0629  WBC 15.0* 16.7*  HGB 13.3 13.9  HCT 41.1 43.3  MCV 89.3 89.1  PLT 131* 155   Scheduled Meds: . chlorhexidine gluconate (MEDLINE KIT)  15 mL Mouth Rinse BID  . Chlorhexidine Gluconate Cloth  6 each Topical Daily  . chlorpheniramine-HYDROcodone  5 mL Oral Q12H  . enoxaparin (LOVENOX) injection  40 mg Subcutaneous Q24H  . ipratropium-albuterol  3 mL Nebulization Q6H  .  lidocaine  1 patch Transdermal Q24H  . mouth rinse  15 mL Mouth Rinse 10 times per day  . methylPREDNISolone (SOLU-MEDROL) injection  40 mg Intravenous Q12H  . pantoprazole (PROTONIX) IV  40 mg Intravenous Q24H  . senna  2 tablet Oral Daily   Continuous Infusions: . sodium chloride    . dexmedetomidine (PRECEDEX) IV infusion 0.5 mcg/kg/hr (06/15/20 0658)  . piperacillin-tazobactam (ZOSYN)  IV 3.375 g (06/15/20 1135)  . propofol (DIPRIVAN) infusion 35 mcg/kg/min (06/15/20 1152)  . vancomycin      Procedures/Studies: DG Chest 1 View  Result Date: 06/03/2020 CLINICAL DATA:  COVID 19 pneumonia. EXAM: CHEST  1 VIEW COMPARISON:  CT a chest in chest x-ray dated May 31, 2020. FINDINGS: Unchanged mild cardiomegaly. Diffuse bilateral interstitial and patchy bibasilar airspace opacities are not significantly changed. No pleural effusion or pneumothorax. No acute osseous abnormality. IMPRESSION: 1. Unchanged multifocal COVID-19 pneumonia. Electronically Signed   By: Titus Dubin M.D.   On: 06/03/2020 12:31   CT Angio Chest PE W/Cm &/Or Wo Cm  Result Date: 05/31/2020 CLINICAL DATA:  Shortness of breath.  COVID-19 positive. EXAM: CT ANGIOGRAPHY CHEST WITH CONTRAST TECHNIQUE: Multidetector CT imaging of the chest was performed using the standard protocol during bolus administration of intravenous contrast. Multiplanar CT image reconstructions and MIPs were obtained to evaluate the vascular anatomy. CONTRAST:  88m OMNIPAQUE IOHEXOL 350 MG/ML SOLN COMPARISON:  None. FINDINGS: Cardiovascular: Satisfactory opacification of the pulmonary arteries to the segmental level. No evidence of pulmonary embolism. Mild cardiomegaly. No pericardial effusion. Atherosclerosis of thoracic aorta is noted without aneurysm formation. Mediastinum/Nodes: No enlarged mediastinal, hilar, or axillary lymph nodes. Thyroid gland, trachea, and esophagus demonstrate no  significant findings. Lungs/Pleura: No pneumothorax or pleural  effusion is noted. Patchy airspace opacities are noted throughout both lungs consistent with multifocal pneumonia. Upper Abdomen: No acute abnormality. Musculoskeletal: No chest wall abnormality. No acute or significant osseous findings. Review of the MIP images confirms the above findings. IMPRESSION: 1. No definite evidence of pulmonary embolus. 2. Patchy airspace opacities are noted throughout both lungs consistent with multifocal pneumonia. 3. Aortic atherosclerosis. Aortic Atherosclerosis (ICD10-I70.0). Electronically Signed   By: Marijo Conception M.D.   On: 05/31/2020 14:32   US Venous Img Lower Bilateral (DVT)  Result Date: 06/03/2020 CLINICAL DATA:  Elevated D-dimer. Former smoker. Patient is currently on anticoagulation. Evaluate for DVT. EXAM: BILATERAL LOWER EXTREMITY VENOUS DOPPLER ULTRASOUND TECHNIQUE: Gray-scale sonography with graded compression, as well as color Doppler and duplex ultrasound were performed to evaluate the lower extremity deep venous systems from the level of the common femoral vein and including the common femoral, femoral, profunda femoral, popliteal and calf veins including the posterior tibial, peroneal and gastrocnemius veins when visible. The superficial great saphenous vein was also interrogated. Spectral Doppler was utilized to evaluate flow at rest and with distal augmentation maneuvers in the common femoral, femoral and popliteal veins. COMPARISON:  None. FINDINGS: RIGHT LOWER EXTREMITY Common Femoral Vein: No evidence of thrombus. Normal compressibility, respiratory phasicity and response to augmentation. Saphenofemoral Junction: No evidence of thrombus. Normal compressibility and flow on color Doppler imaging. Profunda Femoral Vein: No evidence of thrombus. Normal compressibility and flow on color Doppler imaging. Femoral Vein: No evidence of thrombus. Normal compressibility, respiratory phasicity and response to augmentation. Popliteal Vein: No evidence of thrombus.  Normal compressibility, respiratory phasicity and response to augmentation. Calf Veins: No evidence of thrombus. Normal compressibility and flow on color Doppler imaging. Superficial Great Saphenous Vein: No evidence of thrombus. Normal compressibility. Venous Reflux:  None. Other Findings: Note is made of an approximately 2.2 x 1.2 x 1.1 cm anechoic fluid collection within the right popliteal fossa compatible with a Baker's cyst. LEFT LOWER EXTREMITY Common Femoral Vein: No evidence of thrombus. Normal compressibility, respiratory phasicity and response to augmentation. Saphenofemoral Junction: No evidence of thrombus. Normal compressibility and flow on color Doppler imaging. Profunda Femoral Vein: No evidence of thrombus. Normal compressibility and flow on color Doppler imaging. Femoral Vein: No evidence of thrombus. Normal compressibility, respiratory phasicity and response to augmentation. Popliteal Vein: No evidence of thrombus. Normal compressibility, respiratory phasicity and response to augmentation. Calf Veins: No evidence of thrombus. Normal compressibility and flow on color Doppler imaging. Superficial Great Saphenous Vein: No evidence of thrombus. Normal compressibility. Venous Reflux:  None. Other Findings:  None. IMPRESSION: 1. No evidence of DVT within either lower extremity. 2. Incidental note made of an approximately 2.2 cm right-sided Baker's cyst. Electronically Signed   By: Sandi Mariscal M.D.   On: 06/03/2020 15:42   DG CHEST PORT 1 VIEW  Result Date: 06/15/2020 CLINICAL DATA:  Respiratory failure EXAM: PORTABLE CHEST 1 VIEW COMPARISON:  06/13/2020 FINDINGS: Endotracheal tube is seen 19 mm above the carina. Nasogastric tube extends into the gastric fundus. Pulmonary insufflation is symmetric and is stable since prior examination. Superimposed extensive, diffuse airspace infiltrate has progressed in the interval since prior examination, particularly within the right upper lung zone. A a  developing pneumatocele is seen within the right lung base measuring 3.5 cm. No pneumothorax or pleural effusion. Cardiac size within normal limits. IMPRESSION: Support tubes in appropriate position. Progressive multifocal pulmonary infiltrates, likely infectious. Developing pneumatocele within the right lung  base. No pneumothorax. Electronically Signed   By: Fidela Salisbury MD   On: 06/15/2020 02:24   DG CHEST PORT 1 VIEW  Result Date: 06/13/2020 CLINICAL DATA:  Worsening shortness of breath, COVID-19 positive on 05/31/2020, former smoker EXAM: PORTABLE CHEST 1 VIEW COMPARISON:  Portable exam 0850 hours compared to 06/03/2020 FINDINGS: Normal heart size mediastinal contours. Patchy BILATERAL pulmonary infiltrates consistent with multifocal pneumonia and COVID-19, increased from previous exam. No pleural effusion or pneumothorax. No acute osseous findings. IMPRESSION: Increased BILATERAL pulmonary infiltrates consistent with multifocal pneumonia and COVID-19. Electronically Signed   By: Lavonia Dana M.D.   On: 06/13/2020 11:06   DG Chest Port 1 View  Result Date: 05/31/2020 CLINICAL DATA:  Shortness of breath. EXAM: PORTABLE CHEST 1 VIEW COMPARISON:  March 25, 2009. FINDINGS: The heart size and mediastinal contours are within normal limits. No pneumothorax or pleural effusion is noted. Patchy airspace opacities are noted throughout both lungs consistent with multifocal pneumonia. The visualized skeletal structures are unremarkable. IMPRESSION: Bilateral multifocal pneumonia. Electronically Signed   By: Marijo Conception M.D.   On: 05/31/2020 13:24    Barton Dubois, MD  [facility]  Time: 35 minutes.  If 7PM-7AM, please contact night-coverage [email or web address] Password [email or web address] 06/15/2020, 12:16 PM   LOS: 15 days

## 2020-06-15 NOTE — Progress Notes (Signed)
Report called to Lequita Halt, Charity fundraiser at [facility], [address].

## 2020-06-15 NOTE — Progress Notes (Signed)
Temperature continues to rise from last night, currently 102.4. 650 mg of tylenol given per tube at 0947. Will continue to monitor trend in temp. Via temp foley.

## 2020-06-15 NOTE — Progress Notes (Signed)
Pt arrived to the MICU at approximately 18:30.  Pt properly sedated with of fentanyl and on of Levo. Antibiotics and NS @75  also running.  Airborne precautions initiated, pt's VS stable at this time.  Patient not responding to commands, or painful stimuli. Patient began slow rigid rhythmic motion, no nystagmus.  MD aware, 2mg  versed given.  Orders initiated, passed report to [facility] RN, .

## 2020-06-15 NOTE — H&P (Signed)
NAMEAlla [name], MRN:  [id number], DOB:  1955-05-05, LOS: 15 ADMISSION DATE:  05/31/2020, CONSULTATION DATE: 11/15/2020 REFERRING MD: [facility], CHIEF COMPLAINT: Acute respiratory failure secondary to Covid with subsequent possible acquired pneumonia  Brief History:  65 year old Covid patient requiring intubation transfer to [facility] 06/16/2018  History of Present Illness:  65 year old female with a medical history significant for gastroesophageal reflux disease, kidney stones who was seen at [facility] 05/31/2020 with a chief complaint of 7 days of increasing dyspnea, upper respiratory tract infection symptoms including headaches generalized weakness and decreased appetite.  She was seen in urgent care found to have O2 sats of 50% and was admitted to [facility] [address] for further evaluation and treatment.  She was treated with IV fluids systemic steroids and antivirals.  After admission to the hospital she continued decline on 06/15/2020 early in a.m. she was intubated by the emergency department physician.  Due to the fact she is COVID-19 may need fiberoptic bronchoscopy and is on full mechanical ventilatory support she will be transferred to [facility] hospital for further evaluation and treatment Past Medical History:   Past Medical History:  Diagnosis Date  . GERD (gastroesophageal reflux disease)   . Hypercholesteremia   . Kidney stones   . Wears dentures    full upper, partial lower     Significant Hospital Events:  Intubated 06/15/2020 Transferred to [facility] 06/15/2020  Consults:    Procedures:  06/15/2020 intubation by the emergency department physician and endocrinology  Significant Diagnostic Tests:    Micro Data:  06/15/2020 blood cultures x2 06/15/2020 sputum culture 06/15/2020 urine culture  Antimicrobials:  06/15/2020 vancomycin 06/15/2020 Zosyn  Interim History / Subjective:  65 year old unvaccinated female with COVID-19 required intubation  and being transferred to [facility] 06/15/2020  Objective   Blood pressure (!) 82/55, pulse (!) 104, temperature (!) 101.1 F (38.4 C), resp. rate (!) 24, height 5\' 4"  (1.626 m), weight 69.7 kg, SpO2 93 %.    Vent Mode: PRVC FiO2 (%):  [60 %-100 %] 80 % Set Rate:  [15 bmp-20 bmp] 20 bmp Vt Set:  [350 mL] 350 mL PEEP:  [5 cmH20-7 cmH20] 5 cmH20 Plateau Pressure:  [10 cmH20-22 cmH20] 22 cmH20   Intake/Output Summary (Last 24 hours) at 06/15/2020 1330 Last data filed at 06/15/2020 0533 Gross per 24 hour  Intake 100 ml  Output 850 ml  Net -750 ml   Filed Weights   05/31/20 1244 06/08/20 0500 06/14/20 0900  Weight: 72.6 kg 70.9 kg 69.7 kg    Examination: Blood pressure 104/69, pulse 97, temperature (!) 100.76 F (38.2 C), resp. rate (!) 22, height 5\' 4"  (1.626 m), weight 69.7 kg, SpO2 (!) 89 %. Gen:      No acute distress HEENT:  EOMI, sclera anicteric Neck:     No masses; no thyromegaly, ETT Lungs:    Clear to auscultation bilaterally; normal respiratory effort CV:         Regular rate and rhythm; no murmurs Abd:      + bowel sounds; soft, non-tender; no palpable masses, no distension Ext:    No edema; adequate peripheral perfusion Skin:      Warm and dry; no rash Neuro: alert and oriented x 3 Psych: normal mood and affect  Resolved Hospital Problem list     Assessment & Plan:  Ventilator dependent respiratory failure in the setting of COVID-19 hypoxia and hypercarbia with PCO2 of 70 with a pH of 7.24 Low  stretch protocol Wean FiO2 to keep sats greater than 92% Pulmonary toilet Serial chest x-rays Continue vancomycin Zosyn for HCAP day 0 of both Propofol and Precedex for tube tolerance Continue steroids 40 mg IV every 12 May need fiberoptic bronchoscopy in future  Hypotension most likely secondary to sedation Monitor intensive care unit May need vasopressor support  Hyponatremia hyperkalemia Recent Labs  Lab 06/10/20 0603 06/11/20 0629  NA 134* 132*   Recent  Labs  Lab 06/10/20 0603 06/11/20 0629  K 5.2* 4.7   Monitor replete as needed   Gastroesophageal reflux disease Continue proton pump inhibitor  History of kidney stones Monitor creatinine    Best practice (evaluated daily)  Diet: NPO Pain/Anxiety/Delirium protocol (if indicated): CCM pain and agitation management VAP protocol (if indicated): In place DVT prophylaxis: In place Lovenox GI prophylaxis: PPi Glucose control: Sliding scale insulin protocol Mobility: Bedrest Disposition: [facility] from [name] ICU to [name], ICU 06/16/2018  Goals of Care:  Last date of multidisciplinary goals of care discussion: Family and staff present:  Summary of discussion:  Follow up goals of care discussion due:  Code Status: Full  Labs   CBC: Recent Labs  Lab 06/10/20 0603 06/11/20 0629  WBC 15.0* 16.7*  HGB 13.3 13.9  HCT 41.1 43.3  MCV 89.3 89.1  PLT 131* 155    Basic Metabolic Panel: Recent Labs  Lab 06/10/20 0603 06/11/20 0629  NA 134* 132*  K 5.2* 4.7  CL 94* 93*  CO2 29 28  GLUCOSE 175* 150*  BUN 26* 23  CREATININE 0.71 0.62  CALCIUM 8.7* 8.1*   GFR: Estimated Creatinine Clearance: 69 mL/min (by C-G formula based on SCr of 0.62 mg/dL). Recent Labs  Lab 06/10/20 0603 06/11/20 0629  WBC 15.0* 16.7*    Liver Function Tests: Recent Labs  Lab 06/10/20 0603 06/11/20 0629  AST 26 27  ALT 24 21  ALKPHOS 63 61  BILITOT 0.7 0.9  PROT 5.5* 5.9*  ALBUMIN 3.0* 3.3*   No results for input(s): LIPASE, AMYLASE in the last 168 hours. No results for input(s): AMMONIA in the last 168 hours.  ABG    Component Value Date/Time   PHART 7.246 (L) 06/15/2020 0414   PCO2ART 70.7 (HH) 06/15/2020 0414   PO2ART 86.6 06/15/2020 0414   HCO3 24.2 06/15/2020 0414   ACIDBASEDEF 0.9 06/14/2020 1115   O2SAT 93.8 06/15/2020 0414     Coagulation Profile: No results for input(s): INR, PROTIME in the last 168 hours.  Cardiac Enzymes: No results for input(s):  CKTOTAL, CKMB, CKMBINDEX, TROPONINI in the last 168 hours.  HbA1C: No results found for: HGBA1C  CBG: No results for input(s): GLUCAP in the last 168 hours.  Review of Systems:   na  Past Medical History:  She,  has a past medical history of GERD (gastroesophageal reflux disease), Hypercholesteremia, Kidney stones, and Wears dentures.   Surgical History:   Past Surgical History:  Procedure Laterality Date  . ABDOMINAL HYSTERECTOMY    . BREAST BIOPSY Right   . BREAST EXCISIONAL BIOPSY Right   . COLONOSCOPY    . COLONOSCOPY WITH PROPOFOL N/A 04/22/2015   Procedure: COLONOSCOPY WITH PROPOFOL;  Surgeon: Midge Minium, MD;  Location: [facility];  Service: Endoscopy;  Laterality: N/A;  . KIDNEY STONE SURGERY       Social History:   reports that she has quit smoking. Her smoking use included cigarettes. She has never used smokeless tobacco. She reports that she does not drink alcohol and  does not use drugs.   Family History:  Her family history includes Breast cancer in her paternal aunt.   Allergies No Known Allergies   Home Medications  Prior to Admission medications   Medication Sig Start Date End Date Taking? Authorizing Provider  Cholecalciferol 25 MCG (1000 UT) capsule cholecalciferol (vitamin D3) 25 mcg (1,000 unit) capsule  TAKE 1 CAPSULE BY MOUTH EVERY DAY   Yes [provider]  denosumab (PROLIA) 60 MG/ML SOLN injection Inject 60 mg into the skin every 6 (six) months.   Yes [provider]  fluticasone (FLONASE) 50 MCG/ACT nasal spray Place 1 spray into both nostrils daily. 06/04/15  Yes [provider]  ibuprofen (ADVIL) 600 MG tablet    Yes [provider]  Krill Oil 1000 MG CAPS Take 1 capsule by mouth daily.   Yes [provider]  omeprazole (PRILOSEC) 20 MG capsule    Yes [provider]  simvastatin (ZOCOR) 40 MG tablet Take 40 mg by mouth daily at 6 PM.   Yes [provider]  Ca Carbonate-Mag  Hydroxide (ROLAIDS PO) Take by mouth. Patient not taking: No sig reported    [provider]  famotidine (PEPCID) 20 MG tablet famotidine 20 mg tablet  TAKE 1 TABLET BY MOUTH TWICE A DAY Patient not taking: No sig reported    [provider]     Critical care time:     Attending note: I have seen and examined the patient. History, labs and imaging reviewed.  65 year old with COVID-19 infection, initially admitted to [facility] on 2/18.  Treated with tocilizumab, remdesivir, steroids. Developed worsening respiratory status, failed BiPAP, started on antibiotics for HAP.  Intubated and transferred to [facility]  Blood pressure 104/69, pulse 97, temperature (!) 100.76 F (38.2 C), resp. rate (!) 22, height 5\' 4"  (1.626 m), weight 69.7 kg, SpO2 (!) 89 %. Gen:      No acute distress HEENT:  EOMI, sclera anicteric, ET tube Neck:     No masses; no thyromegaly Lungs:    Clear to auscultation bilaterally; normal respiratory effort CV:         Regular rate and rhythm; no murmurs Abd:      + bowel sounds; soft, non-tender; no palpable masses, no distension Ext:    No edema; adequate peripheral perfusion Skin:      Warm and dry; no rash Neuro: Sedated, unresponsive  Labs/Imaging personally reviewed, significant for No recent metabolic panel or CBC, lactic acid 1.6 Chest x-ray 3/5 with progressive multifocal infiltrate  Assessment/plan: ARDS secondary to COVID-19 pneumonia HAP 6 cc/kg low tidal volume ventilation Keep plateau pressure less than 30, driving pressure less than 15 Check ABG and decide if we need to prone S/p remdesivir, Actemra Continue steroids  Septic shock Wean off low-dose levo.  Continue Vanco, Zosyn  The patient is critically ill with multiple organ systems failure and requires high complexity decision making for assessment and support, frequent evaluation and titration of therapies, application of advanced monitoring technologies and  extensive interpretation of multiple databases.  Critical care time - 35 mins. This represents my time independent of the NPs time taking care of the pt.  Marland Kitchen MD [facility] 06/15/2020, 6:09 PM

## 2020-06-15 NOTE — ED Provider Notes (Signed)
I was called to the ICU for assistance with intubation on this patient.  She has been admitted for several weeks with COVID-19, however now is decompensating.  She is becoming more hypoxic and blood gas more concerning.  When I arrived to the room, patient appears encephalopathic and not tolerating BiPAP.  Oxygen saturations in the low 90s and patient tachypneic and tachycardic.  I agree that intubation is indicated in this situation.   INTUBATION Performed by: Geoffery Lyons  Required items: required blood products, implants, devices, and special equipment available Patient identity confirmed: provided demographic data and hospital-assigned identification number Time out: Immediately prior to procedure a "time out" was called to verify the correct patient, procedure, equipment, support staff and site/side marked as required.  Indications: respiratory failure related to Covid 19  Intubation method: Glidescope Laryngoscopy   Preoxygenation: Bipap  Sedatives: 20 mg Etomidate Paralytic: 150 mg Succinylcholine  Tube Size: 7.5 cuffed  Post-procedure assessment: chest rise and ETCO2 monitor Breath sounds: equal and absent over the epigastrium Tube secured with: ETT holder Chest x-ray interpreted by radiologist and me.  Chest x-ray findings: endotracheal tube in appropriate position  Patient tolerated the procedure well with no immediate complications.      Geoffery Lyons, MD 06/15/20 [phone]

## 2020-06-15 NOTE — Progress Notes (Signed)
   06/15/20 1040  Vitals  Temp (!) 102.38 F (39.1 C)  BP (!) 71/50  MAP (mmHg) (!) 55  Pulse Rate (!) 117  ECG Heart Rate (!) 121  Resp (!) 27  Oxygen Therapy  SpO2 93 %  MEWS Score  MEWS Temp 2  MEWS Systolic 2  MEWS Pulse 2  MEWS RR 2  MEWS LOC 2  MEWS Score 10  MEWS Score Color Red   Dr. Gwenlyn Perking made aware of current vitals. Last labs drawn on March 1st. Suggested lab work and sepsis work up.

## 2020-06-15 NOTE — Progress Notes (Signed)
Etomidate  20 mg given at 0106.  Succinylcholine 150 mg given at 0107.  Patient intubated successfully at 0113.

## 2020-06-15 NOTE — Progress Notes (Addendum)
eLink Physician-Brief Progress Note Patient Name: Rachel [name] DOB: Aug 31, 1955 MRN: [id number]   Date of Service  06/15/2020  HPI/Events of Note  Followed up on ABG Current vent is set at VT 380, peep 10, RR 34, Fio2 100 This is different from the prior settings at [facility], RN is not sure if the changes were made before or after the last blood gas On camera, patient is off pressors, on low dose fentanyl, still moving around, and not synchronous with vent at this time. O2 sat is 100  eICU Interventions  Add propofol (was on at it [facility] but not ordered here) Check another ABG in 20 min Asked RN to call after that ABG is done May need to be proned but certainly needs better sedation first  RN will call with results     Intervention Category Major Interventions: Respiratory failure - evaluation and management  Oretha Milch 06/15/2020, 7:36 PM   8:20 pm - ABG noted Much better Peak pressure in 20s Peripheral O2 sat is 100% Lower Fio2 to keep O2 sat 90-92 Repeat an ABG around midnight Continue with plans to sedate We will prone if she does not continue to improve, for now , improved and not sedated  D/w RN   12:15 am Notified ABG  7.27/ Pco 23 / Po2 115 Seen on camera O2 sat is 100, on 80%/peep 10 and no distress Pco2 does not seem accurate I am told this was on an istat machine and the results are not crossing over into Epic D/w RT, will repeat the ABG in lab  Please call once done

## 2020-06-15 NOTE — Progress Notes (Signed)
Pt transported via carelink, left unit at 1725

## 2020-06-15 NOTE — Progress Notes (Signed)
Pharmacy Antibiotic Note  Rachel [name] is a 65 y.o. female admitted on 05/31/2020 with pneumonia.  Currently on vancomycin and Zosyn. Switching Zosyn to Cefepime  Plan: Vancomycin 1500mg  loading dose, then 750mg  IV every 12 hours.  Goal trough 15-20 mcg/mL. Zosyn 3.375g IV q8h (4 hour infusion).  Stop Zosyn and start Cefepime 2 gm IV Q 8 hours  F/U cxs and clinical progress\ F/u Bmet tomorrow   Height: 5\' 4"  (162.6 cm) Weight: 69.7 kg (153 lb 10.6 oz) IBW/kg (Calculated) : 54.7  Temp (24hrs), Avg:101.6 F (38.7 C), Min:97.9 F (36.6 C), Max:102.9 F (39.4 C)  Recent Labs  Lab 06/10/20 0603 06/11/20 0629 06/15/20 1328  WBC 15.0* 16.7*  --   CREATININE 0.71 0.62  --   LATICACIDVEN  --   --  1.6    Estimated Creatinine Clearance: 69 mL/min (by C-G formula based on SCr of 0.62 mg/dL).    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 for unit-specific pharmacist

## 2020-06-15 NOTE — Progress Notes (Signed)
Orders received for 500 bolus of normal saline. Started new 20 gauge PIV and initiated bolus.

## 2020-06-15 NOTE — Progress Notes (Signed)
I personally called and spoke with spouse. I have explained that patient is not able to tolerate BIPAP. She keeps taking it off, and I can't sedate her more with this method of ventilation. I have done my best to explain that her RR is high, and she is likely going to tire out. Spouse reports confusion re: different reports from different physicians, and does not understand why she is doing worse, when she had been doing better. We had an extensive conversation about the nature of the disease, and ARDS. He reports that they have discussed it and she would want to be intubated for a chance at life. He reports understanding that a minority of patients survive covid to later be extubated, when it reaches this point. He wants everything done, FULL CODE.  Patient was intubated at 1:13am. She was started on propofol, but was still alert and trying to pull at tube, so precedex added. CXR pending.

## 2020-06-16 ENCOUNTER — Inpatient Hospital Stay (HOSPITAL_COMMUNITY): Payer: BLUE CROSS/BLUE SHIELD

## 2020-06-16 DIAGNOSIS — U071 COVID-19: Secondary | ICD-10-CM | POA: Diagnosis not present

## 2020-06-16 DIAGNOSIS — J1282 Pneumonia due to coronavirus disease 2019: Secondary | ICD-10-CM | POA: Diagnosis not present

## 2020-06-16 LAB — BASIC METABOLIC PANEL
Anion gap: 12 (ref 5–15)
BUN: 33 mg/dL — ABNORMAL HIGH (ref 8–23)
CO2: 22 mmol/L (ref 22–32)
Calcium: 7.5 mg/dL — ABNORMAL LOW (ref 8.9–10.3)
Chloride: 102 mmol/L (ref 98–111)
Creatinine, Ser: 0.84 mg/dL (ref 0.44–1.00)
GFR, Estimated: >60 mL/min (ref >60)
Glucose, Bld: 205 mg/dL — ABNORMAL HIGH (ref 70–99)
Potassium: 4.5 mmol/L (ref 3.5–5.1)
Sodium: 136 mmol/L (ref 135–145)

## 2020-06-16 LAB — CBC
HCT: 38.4 % (ref 36.0–46.0)
Hemoglobin: 12.7 g/dL (ref 12.0–15.0)
MCH: 29.4 pg (ref 26.0–34.0)
MCHC: 33.1 g/dL (ref 30.0–36.0)
MCV: 88.9 fL (ref 80.0–100.0)
Platelets: 203 10*3/uL (ref 150–400)
RBC: 4.32 MIL/uL (ref 3.87–5.11)
RDW: 14.7 % (ref 11.5–15.5)
WBC: 18 10*3/uL — ABNORMAL HIGH (ref 4.0–10.5)
nRBC: 0 % (ref 0.0–0.2)

## 2020-06-16 LAB — POCT I-STAT 7, (LYTES, BLD GAS, ICA,H+H)
Acid-base deficit: 3 mmol/L — ABNORMAL HIGH (ref 0.0–2.0)
Acid-base deficit: 2 mmol/L (ref 0.0–2.0)
Bicarbonate: 24.4 mmol/L (ref 20.0–28.0)
Bicarbonate: 26.5 mmol/L (ref 20.0–28.0)
Calcium, Ion: 1.06 mmol/L — ABNORMAL LOW (ref 1.15–1.40)
Calcium, Ion: 1.07 mmol/L — ABNORMAL LOW (ref 1.15–1.40)
HCT: 38 % (ref 36.0–46.0)
HCT: 33 % — ABNORMAL LOW (ref 36.0–46.0)
Hemoglobin: 12.9 g/dL (ref 12.0–15.0)
Hemoglobin: 11.2 g/dL — ABNORMAL LOW (ref 12.0–15.0)
O2 Saturation: 92 %
O2 Saturation: 100 %
Patient temperature: 102.2
Patient temperature: 98.7
Potassium: 4.8 mmol/L (ref 3.5–5.1)
Potassium: 4.2 mmol/L (ref 3.5–5.1)
Sodium: 137 mmol/L (ref 135–145)
Sodium: 139 mmol/L (ref 135–145)
TCO2: 26 mmol/L (ref 22–32)
TCO2: 28 mmol/L (ref 22–32)
pCO2 arterial: 58.4 mmHg — ABNORMAL HIGH (ref 32.0–48.0)
pCO2 arterial: 62.5 mmHg — ABNORMAL HIGH (ref 32.0–48.0)
pH, Arterial: 7.24 — ABNORMAL LOW (ref 7.350–7.450)
pH, Arterial: 7.235 — ABNORMAL LOW (ref 7.350–7.450)
pO2, Arterial: 84 mmHg (ref 83.0–108.0)
pO2, Arterial: 222 mmHg — ABNORMAL HIGH (ref 83.0–108.0)

## 2020-06-16 LAB — BLOOD GAS, ARTERIAL
Acid-base deficit: 0.6 mmol/L (ref 0.0–2.0)
Bicarbonate: 25.2 mmol/L (ref 20.0–28.0)
Drawn by: 59133
FIO2: 80
O2 Saturation: 94.7 %
Patient temperature: 36.8
pCO2 arterial: 53.9 mmHg — ABNORMAL HIGH (ref 32.0–48.0)
pH, Arterial: 7.29 — ABNORMAL LOW (ref 7.350–7.450)
pO2, Arterial: 72.5 mmHg — ABNORMAL LOW (ref 83.0–108.0)

## 2020-06-16 LAB — PHOSPHORUS: Phosphorus: 2.5 mg/dL (ref 2.5–4.6)

## 2020-06-16 LAB — CORTISOL: Cortisol, Plasma: 28.2 ug/dL

## 2020-06-16 LAB — PROCALCITONIN: Procalcitonin: 0.9 ng/mL

## 2020-06-16 LAB — LACTIC ACID, PLASMA
Lactic Acid, Venous: 1.7 mmol/L (ref 0.5–1.9)
Lactic Acid, Venous: 1.6 mmol/L (ref 0.5–1.9)

## 2020-06-16 LAB — GLUCOSE, CAPILLARY
Glucose-Capillary: 212 mg/dL — ABNORMAL HIGH (ref 70–99)
Glucose-Capillary: 186 mg/dL — ABNORMAL HIGH (ref 70–99)
Glucose-Capillary: 166 mg/dL — ABNORMAL HIGH (ref 70–99)
Glucose-Capillary: 150 mg/dL — ABNORMAL HIGH (ref 70–99)
Glucose-Capillary: 144 mg/dL — ABNORMAL HIGH (ref 70–99)

## 2020-06-16 LAB — STREP PNEUMONIAE URINARY ANTIGEN: Strep Pneumo Urinary Antigen: POSITIVE — AB

## 2020-06-16 LAB — HEMOGLOBIN A1C
Hgb A1c MFr Bld: 6.4 % — ABNORMAL HIGH (ref 4.8–5.6)
Mean Plasma Glucose: 136.98 mg/dL

## 2020-06-16 LAB — MAGNESIUM: Magnesium: 3.1 mg/dL — ABNORMAL HIGH (ref 1.7–2.4)

## 2020-06-16 MED ORDER — SODIUM CHLORIDE 0.9% FLUSH: 10.0000 mL | INTRAVENOUS | Status: DC | PRN

## 2020-06-16 MED ORDER — SODIUM CHLORIDE 0.9% FLUSH
10.0000 mL | Freq: Two times a day (BID) | INTRAVENOUS | Status: DC
  Administered 2020-06-17 – 2020-06-22 (×11): 10 mL
  Administered 2020-06-23: 20 mL
  Administered 2020-06-23: 10 mL
  Administered 2020-06-24: 20 mL
  Administered 2020-06-25 – 2020-06-30 (×10): 10 mL
  Administered 2020-06-30: 20 mL
  Administered 2020-07-01 – 2020-07-03 (×6): 10 mL

## 2020-06-16 MED ORDER — INSULIN ASPART 100 UNIT/ML ~~LOC~~ SOLN
0.0000 [IU] | SUBCUTANEOUS | Status: DC
  Administered 2020-06-16: 5 [IU] via SUBCUTANEOUS
  Administered 2020-06-16: 2 [IU] via SUBCUTANEOUS
  Administered 2020-06-16 (×2): 3 [IU] via SUBCUTANEOUS
  Administered 2020-06-17: 2 [IU] via SUBCUTANEOUS
  Administered 2020-06-17: 3 [IU] via SUBCUTANEOUS
  Administered 2020-06-17 (×3): 2 [IU] via SUBCUTANEOUS
  Administered 2020-06-18: 5 [IU] via SUBCUTANEOUS
  Administered 2020-06-18: 3 [IU] via SUBCUTANEOUS
  Administered 2020-06-18: 5 [IU] via SUBCUTANEOUS
  Administered 2020-06-18: 1 [IU] via SUBCUTANEOUS
  Administered 2020-06-18 – 2020-06-19 (×4): 3 [IU] via SUBCUTANEOUS
  Administered 2020-06-19 (×2): 5 [IU] via SUBCUTANEOUS
  Administered 2020-06-19: 3 [IU] via SUBCUTANEOUS
  Administered 2020-06-20 (×2): 5 [IU] via SUBCUTANEOUS
  Administered 2020-06-20 (×2): 3 [IU] via SUBCUTANEOUS
  Administered 2020-06-20 (×2): 5 [IU] via SUBCUTANEOUS
  Administered 2020-06-21 (×2): 2 [IU] via SUBCUTANEOUS
  Administered 2020-06-21 (×2): 3 [IU] via SUBCUTANEOUS
  Administered 2020-06-21: 5 [IU] via SUBCUTANEOUS
  Administered 2020-06-21 – 2020-06-22 (×3): 3 [IU] via SUBCUTANEOUS
  Administered 2020-06-22: 5 [IU] via SUBCUTANEOUS
  Administered 2020-06-22: 3 [IU] via SUBCUTANEOUS
  Administered 2020-06-22 (×2): 2 [IU] via SUBCUTANEOUS
  Administered 2020-06-23: 3 [IU] via SUBCUTANEOUS
  Administered 2020-06-23: 5 [IU] via SUBCUTANEOUS
  Administered 2020-06-23 (×2): 3 [IU] via SUBCUTANEOUS

## 2020-06-16 MED ORDER — VANCOMYCIN HCL 1000 MG/200ML IV SOLN
1000.0000 mg | INTRAVENOUS | Status: DC
  Administered 2020-06-16: 1000 mg via INTRAVENOUS
  Filled 2020-06-16: qty 200

## 2020-06-16 NOTE — Procedures (Signed)
Insertion of Chest Tube Procedure Note  Rachel [name]  [id number]  1955/06/13  Date:06/16/20  Time:10:33 AM    Provider Performing: Chilton Greathouse   Procedure: Chest Tube Insertion (32551)  Indication(s) Pneumothorax  Consent Unable to obtain consent due to emergent nature of procedure.  Anesthesia Topical only with 1% lidocaine    Time Out Verified patient identification, verified procedure, site/side was marked, verified correct patient position, special equipment/implants available, medications/allergies/relevant history reviewed, required imaging and test results available.   Sterile Technique Maximal sterile technique including full sterile barrier drape, hand hygiene, sterile gown, sterile gloves, mask, hair covering, sterile ultrasound probe cover (if used).   Procedure Description Ultrasound used to identify appropriate pleural anatomy for placement and overlying skin marked. Area of placement cleaned and draped in sterile fashion.  A pigtail wayne catheter was placed into the left pleural space using Seldinger technique. Appropriate return of air was obtained.  The tube was connected to atrium and placed on -20 cm H2O wall suction.   Complications/Tolerance None; patient tolerated the procedure well. Chest X-ray is ordered to verify placement.   EBL Minimal  Specimen(s) none   Chilton Greathouse MD [place] [facility] See Amion for pager  If no response to pager , please call [phone] until 7pm After 7:00 pm call Elink  [phone] 06/16/2020, 10:34 AM

## 2020-06-16 NOTE — Progress Notes (Signed)
ABG collected and results given to [facility], tried to cross over the results twice but still not in the system. Ph 7.27, paco2 23.4 pao2 115 and bicarb 10.9.

## 2020-06-16 NOTE — Progress Notes (Signed)
ABG collected and send down to the lab per MD request.

## 2020-06-16 NOTE — Procedures (Signed)
Central Venous Catheter Insertion Procedure Note Rachel [name] [id number] [birth year]  Procedure: Insertion of Central Venous Catheter Indications: Assessment of intravascular volume, Drug and/or fluid administration and Frequent blood sampling  Procedure Details Consent: Risks of procedure as well as the alternatives and risks of each were explained to the (patient/caregiver).  Consent for procedure obtained. Time Out: Verified patient identification, verified procedure, site/side was marked, verified correct patient position, special equipment/implants available, medications/allergies/relevent history reviewed, required imaging and test results available.  Performed  Maximum sterile technique was used including antiseptics, cap, gloves, gown, hand hygiene, mask and sheet. Skin prep: Chlorhexidine; local anesthetic administered A antimicrobial bonded/coated triple lumen catheter was placed in the left internal jugular vein using the Seldinger technique. Ultrasound guidance used.Yes.   Catheter placed to 20 cm. Blood aspirated via all 3 ports and then flushed x 3. Line sutured x 2 and dressing applied.  Evaluation Blood flow good Complications: No apparent complications Patient did tolerate procedure well. Chest X-ray ordered to verify placement.  CXR: pending.  Brett Canales Minor ACNP Acute Care Nurse Practitioner [facility] Please consult Amion 06/16/2020, 9:39 AM

## 2020-06-16 NOTE — Progress Notes (Signed)
Rachel [name], MRN:  [id number], DOB:  Apr 03, 1956, LOS: 16 ADMISSION DATE:  05/31/2020, CONSULTATION DATE: 11/15/2020 REFERRING MD: [facility], CHIEF COMPLAINT: Acute respiratory failure secondary to Covid with subsequent possible acquired pneumonia  Brief History:  65 year old Covid patient requiring intubation transfer to [facility] 06/16/2018  History of Present Illness:  64 year old female with a medical history significant for gastroesophageal reflux disease, kidney stones who was seen at [facility] 05/31/2020 with a chief complaint of 7 days of increasing dyspnea, upper respiratory tract infection symptoms including headaches generalized weakness and decreased appetite.  She was seen in urgent care found to have O2 sats of 50% and was admitted to [facility] for further evaluation and treatment.  She was treated with IV fluids systemic steroids and antivirals.  After admission to the hospital she continued decline on 06/15/2020 early in a.m. she was intubated by the emergency department physician.  Due to the fact she is COVID-19 may need fiberoptic bronchoscopy and is on full mechanical ventilatory support she will be transferred to [facility] hospital for further evaluation and treatment Past Medical History:   Past Medical History:  Diagnosis Date  . GERD (gastroesophageal reflux disease)   . Hypercholesteremia   . Kidney stones   . Wears dentures    full upper, partial lower     Significant Hospital Events:  Intubated 06/15/2020 Transferred to [facility] 06/15/2020  Consults:    Procedures:  06/15/2020 intubation by the emergency department physician and endocrinology 06/16/2020 place CVL>> 06/16/2020 Place arterial line>> Significant Diagnostic Tests:    Micro Data:  06/15/2020 blood cultures x2 06/15/2020 sputum culture 06/15/2020 urine culture  Antimicrobials:  06/15/2020 vancomycin 06/15/2020 Zosyn  Interim History / Subjective:  Requiring high  levels FiO2 therefore will begin proning.  Will place central line A-line to better monitor and infusions vasopressor  Objective   Blood pressure (!) 88/67, pulse (!) 119, temperature (!) 102.2 F (39 C), temperature source Bladder, resp. rate (!) 34, height 5\' 4"  (1.626 m), weight 69.7 kg, SpO2 95 %.    Vent Mode: PRVC FiO2 (%):  [80 %-100 %] 80 % Set Rate:  [20 bmp-34 bmp] 34 bmp Vt Set:  [330 mL-380 mL] 380 mL PEEP:  [5 cmH20-10 cmH20] 10 cmH20 Plateau Pressure:  [17 cmH20-28 cmH20] 27 cmH20   Intake/Output Summary (Last 24 hours) at 06/16/2020 0826 Last data filed at 06/16/2020 0600 Gross per 24 hour  Intake 2737.11 ml  Output 955 ml  Net 1782.11 ml   Filed Weights   05/31/20 1244 06/08/20 0500 06/14/20 0900  Weight: 72.6 kg 70.9 kg 69.7 kg    Examination: General: 65 year old female in acute respiratory distress on full mechanical ventilatory support HEENT: Endotracheal tube gastric tube are in place Neuro: Heavily sedated CV: Heart sounds regular regular rate and rhythm PULM: Decreased breath sounds throughout Vent pressure regulated volume control FIO2 80% PEEP 5 RATE 34 VT 380  GI: soft, bsx4 active  GU: Amber urine Extremities: warm/dry, 1+ edema  Skin: no rashes or lesions   Resolved Hospital Problem list     Assessment & Plan:  Ventilator dependent respiratory failure in the setting of COVID-19 hypoxia and hypercarbia with PCO2 of 70 with a pH of 7.24  ARDS protocol Proning 06/16/2020 Tracheal bronchial pulmonary toilet Continue vancomycin and Zosyn day 1 for HCAP Sedation as needed for tube tolerance propofol has been discontinued Continue steroids 40 mg IV every 12      Hypotension  most likely secondary to sedation Monitor intensive care unit Vasopressor support 06/16/2020 we will place central line and arterial line to better to monitor blood pressure and administer Levophed  Hyponatremia hyperkalemia Recent Labs  Lab 06/15/20 2002  06/16/20 0242 06/16/20 0737  NA 136 136 137   Recent Labs  Lab 06/15/20 2002 06/16/20 0242 06/16/20 0737  K 4.6 4.5 4.8   Monitor replete as needed   Gastroesophageal reflux disease Continue proton pump and History of kidney stones Lab Results  Component Value Date   CREATININE 0.84 06/16/2020   CREATININE 0.84 06/15/2020   CREATININE 0.62 06/11/2020    Monitor creatinine   Best practice (evaluated daily)  Diet: NPO Pain/Anxiety/Delirium protocol (if indicated): CCM pain and agitation management VAP protocol (if indicated): In place DVT prophylaxis: In place Lovenox GI prophylaxis: PPi Glucose control: Sliding scale insulin protocol Mobility: Bedrest Disposition: Transfer from Rachel [name], ICU to [facility], ICU 06/16/2018 06/15/2020 updated via telephone. Goals of Care:  Last date of multidisciplinary goals of care discussion: Family and staff present:  Summary of discussion:  Follow up goals of care discussion due:  Code Status: Full  Labs   CBC: Recent Labs  Lab 06/10/20 0603 06/11/20 0629 06/15/20 1837 06/15/20 2000 06/15/20 2002 06/16/20 0242 06/16/20 0737  WBC 15.0* 16.7*  --   --  22.9* 18.0*  --   NEUTROABS  --   --   --   --  21.6*  --   --   HGB 13.3 13.9 13.9 12.9 13.3 12.7 12.9  HCT 41.1 43.3 41.0 38.0 42.0 38.4 38.0  MCV 89.3 89.1  --   --  90.3 88.9  --   PLT 131* 155  --   --  201 203  --     Basic Metabolic Panel: Recent Labs  Lab 06/10/20 0603 06/11/20 0629 06/15/20 1837 06/15/20 2000 06/15/20 2002 06/16/20 0242 06/16/20 0737  NA 134* 132* 135 136 136 136 137  K 5.2* 4.7 4.6 4.4 4.6 4.5 4.8  CL 94* 93*  --   --  104 102  --   CO2 29 28  --   --  20* 22  --   GLUCOSE 175* 150*  --   --  196* 205*  --   BUN 26* 23  --   --  33* 33*  --   CREATININE 0.71 0.62  --   --  0.84 0.84  --   CALCIUM 8.7* 8.1*  --   --  7.6* 7.5*  --   MG  --   --   --   --  3.1* 3.1*  --   PHOS  --   --   --   --  2.9 2.5  --    GFR: Estimated  Creatinine Clearance: 65.7 mL/min (by C-G formula based on SCr of 0.84 mg/dL). Recent Labs  Lab 06/10/20 0603 06/11/20 0629 06/15/20 1328 06/15/20 2002 06/15/20 2125 06/16/20 0242  PROCALCITON  --   --   --  0.90  --   --   WBC 15.0* 16.7*  --  22.9*  --  18.0*  LATICACIDVEN  --   --  1.6  --  1.7 1.6    Liver Function Tests: Recent Labs  Lab 06/10/20 0603 06/11/20 0629 06/15/20 2002  AST 26 27 31   ALT 24 21 20   ALKPHOS 63 61 98  BILITOT 0.7 0.9 1.5*  PROT 5.5* 5.9* 5.4*  ALBUMIN 3.0* 3.3* 2.5*  Recent Labs  Lab 06/15/20 2002  LIPASE 20  AMYLASE 87   No results for input(s): AMMONIA in the last 168 hours.  ABG    Component Value Date/Time   PHART 7.240 (L) 06/16/2020 0737   PCO2ART 58.4 (H) 06/16/2020 0737   PO2ART 84 06/16/2020 0737   HCO3 24.4 06/16/2020 0737   TCO2 26 06/16/2020 0737   ACIDBASEDEF 3.0 (H) 06/16/2020 0737   O2SAT 92.0 06/16/2020 0737     Coagulation Profile: Recent Labs  Lab 06/15/20 2002  INR 1.1    Cardiac Enzymes: No results for input(s): CKTOTAL, CKMB, CKMBINDEX, TROPONINI in the last 168 hours.  HbA1C: No results found for: HGBA1C  CBG: Recent Labs  Lab 06/15/20 1928 06/15/20 2339  GLUCAP 193* 194*      Critical care time:      App cct 38 min   Brett Canales Marquin Patino ACNP Acute Care Nurse Practitioner [facility] Please consult Amion 06/16/2020, 8:27 AM

## 2020-06-16 NOTE — Progress Notes (Signed)
RT NOTES: ETT taped and secured to prepare for proning.

## 2020-06-16 NOTE — Procedures (Signed)
Arterial Catheter Insertion Procedure Note  Rachel [name]  [id number]  June 29, 1955  Date:06/16/20  Time:9:00 AM    Provider Performing: Suszanne Conners    Procedure: Insertion of Arterial Line (63845) without US guidance  Indication(s) Blood pressure monitoring and/or need for frequent ABGs  Consent Unable to obtain consent due to emergent nature of procedure.  Anesthesia None   Time Out Verified patient identification, verified procedure, site/side was marked, verified correct patient position, special equipment/implants available, medications/allergies/relevant history reviewed, required imaging and test results available.   Sterile Technique Maximal sterile technique including full sterile barrier drape, hand hygiene, sterile gown, sterile gloves, mask, hair covering, sterile ultrasound probe cover (if used).   Procedure Description Area of catheter insertion was cleaned with chlorhexidine and draped in sterile fashion. Without real-time ultrasound guidance an arterial catheter was placed into the right radial artery.  Appropriate arterial tracings confirmed on monitor.     Complications/Tolerance None; patient tolerated the procedure well.   EBL Minimal   Specimen(s) None

## 2020-06-17 ENCOUNTER — Inpatient Hospital Stay (HOSPITAL_COMMUNITY): Payer: BLUE CROSS/BLUE SHIELD

## 2020-06-17 DIAGNOSIS — U071 COVID-19: Secondary | ICD-10-CM | POA: Diagnosis not present

## 2020-06-17 DIAGNOSIS — J13 Pneumonia due to Streptococcus pneumoniae: Secondary | ICD-10-CM | POA: Diagnosis not present

## 2020-06-17 DIAGNOSIS — J9601 Acute respiratory failure with hypoxia: Secondary | ICD-10-CM | POA: Diagnosis not present

## 2020-06-17 DIAGNOSIS — J1282 Pneumonia due to coronavirus disease 2019: Secondary | ICD-10-CM | POA: Diagnosis not present

## 2020-06-17 LAB — BASIC METABOLIC PANEL
Anion gap: 7 (ref 5–15)
BUN: 44 mg/dL — ABNORMAL HIGH (ref 8–23)
CO2: 22 mmol/L (ref 22–32)
Calcium: 7 mg/dL — ABNORMAL LOW (ref 8.9–10.3)
Chloride: 110 mmol/L (ref 98–111)
Creatinine, Ser: 0.91 mg/dL (ref 0.44–1.00)
GFR, Estimated: >60 mL/min (ref >60)
Glucose, Bld: 167 mg/dL — ABNORMAL HIGH (ref 70–99)
Potassium: 4.2 mmol/L (ref 3.5–5.1)
Sodium: 139 mmol/L (ref 135–145)

## 2020-06-17 LAB — GLUCOSE, CAPILLARY
Glucose-Capillary: 157 mg/dL — ABNORMAL HIGH (ref 70–99)
Glucose-Capillary: 150 mg/dL — ABNORMAL HIGH (ref 70–99)
Glucose-Capillary: 150 mg/dL — ABNORMAL HIGH (ref 70–99)
Glucose-Capillary: 157 mg/dL — ABNORMAL HIGH (ref 70–99)
Glucose-Capillary: 104 mg/dL — ABNORMAL HIGH (ref 70–99)
Glucose-Capillary: 119 mg/dL — ABNORMAL HIGH (ref 70–99)

## 2020-06-17 LAB — PHOSPHORUS
Phosphorus: 2.4 mg/dL — ABNORMAL LOW (ref 2.5–4.6)
Phosphorus: 2.2 mg/dL — ABNORMAL LOW (ref 2.5–4.6)
Phosphorus: 2.1 mg/dL — ABNORMAL LOW (ref 2.5–4.6)

## 2020-06-17 LAB — MAGNESIUM
Magnesium: 3.3 mg/dL — ABNORMAL HIGH (ref 1.7–2.4)
Magnesium: 3.3 mg/dL — ABNORMAL HIGH (ref 1.7–2.4)
Magnesium: 3.2 mg/dL — ABNORMAL HIGH (ref 1.7–2.4)

## 2020-06-17 LAB — C-REACTIVE PROTEIN: CRP: 22.1 mg/dL — ABNORMAL HIGH (ref <1.0)

## 2020-06-17 LAB — CBC
HCT: 33.7 % — ABNORMAL LOW (ref 36.0–46.0)
Hemoglobin: 10.4 g/dL — ABNORMAL LOW (ref 12.0–15.0)
MCH: 29 pg (ref 26.0–34.0)
MCHC: 30.9 g/dL (ref 30.0–36.0)
MCV: 93.9 fL (ref 80.0–100.0)
Platelets: 186 10*3/uL (ref 150–400)
RBC: 3.59 MIL/uL — ABNORMAL LOW (ref 3.87–5.11)
RDW: 15.2 % (ref 11.5–15.5)
WBC: 13.8 10*3/uL — ABNORMAL HIGH (ref 4.0–10.5)
nRBC: 0 % (ref 0.0–0.2)

## 2020-06-17 LAB — FERRITIN: Ferritin: 1624 ng/mL — ABNORMAL HIGH (ref 11–307)

## 2020-06-17 LAB — URINE CULTURE
Culture: NO GROWTH
Culture: NO GROWTH

## 2020-06-17 LAB — TRIGLYCERIDES: Triglycerides: 297 mg/dL — ABNORMAL HIGH (ref <150)

## 2020-06-17 LAB — D-DIMER, QUANTITATIVE: D-Dimer, Quant: 1.28 ug/mL-FEU — ABNORMAL HIGH (ref 0.00–0.50)

## 2020-06-17 MED ORDER — POLYETHYLENE GLYCOL 3350 17 G PO PACK
17.0000 g | PACK | Freq: Every day | ORAL | Status: DC
  Administered 2020-06-18 – 2020-06-22 (×5): 17 g
  Filled 2020-06-17 (×7): qty 1

## 2020-06-17 MED ORDER — METHYLPREDNISOLONE SODIUM SUCC 40 MG IJ SOLR
20.0000 mg | Freq: Two times a day (BID) | INTRAMUSCULAR | Status: DC
  Administered 2020-06-17 – 2020-06-18 (×2): 20 mg via INTRAVENOUS
  Filled 2020-06-17 (×2): qty 1

## 2020-06-17 MED ORDER — SIMVASTATIN 20 MG PO TABS: 40.0000 mg | ORAL_TABLET | Freq: Every day | ORAL | Status: DC

## 2020-06-17 MED ORDER — PHENYLEPHRINE CONCENTRATED 100MG/250ML (0.4 MG/ML) INFUSION SIMPLE
0.0000 ug/min | INTRAVENOUS | Status: DC
  Administered 2020-06-17: 60 ug/min via INTRAVENOUS
  Administered 2020-06-19: 40 ug/min via INTRAVENOUS
  Filled 2020-06-17 (×2): qty 250

## 2020-06-17 MED ORDER — PROSOURCE TF PO LIQD
45.0000 mL | Freq: Two times a day (BID) | ORAL | Status: DC
  Administered 2020-06-17: 45 mL
  Filled 2020-06-17: qty 45

## 2020-06-17 MED ORDER — PROPOFOL 1000 MG/100ML IV EMUL
INTRAVENOUS | Status: AC
  Filled 2020-06-17: qty 100

## 2020-06-17 MED ORDER — DEXMEDETOMIDINE HCL IN NACL 400 MCG/100ML IV SOLN
0.0000 ug/kg/h | INTRAVENOUS | Status: DC
  Administered 2020-06-17: 1 ug/kg/h via INTRAVENOUS
  Filled 2020-06-17: qty 100

## 2020-06-17 MED ORDER — PROSOURCE TF PO LIQD
45.0000 mL | Freq: Every day | ORAL | Status: DC
  Administered 2020-06-18 – 2020-07-01 (×14): 45 mL
  Filled 2020-06-17 (×14): qty 45

## 2020-06-17 MED ORDER — PANTOPRAZOLE SODIUM 40 MG PO PACK
40.0000 mg | PACK | ORAL | Status: DC
  Administered 2020-06-17 – 2020-07-09 (×21): 40 mg
  Filled 2020-06-17 (×21): qty 20

## 2020-06-17 MED ORDER — PHENYLEPHRINE HCL-NACL 10-0.9 MG/250ML-% IV SOLN
0.0000 ug/min | INTRAVENOUS | Status: DC
  Administered 2020-06-17: 180 ug/min via INTRAVENOUS
  Administered 2020-06-17: 100 ug/min via INTRAVENOUS
  Administered 2020-06-17: 120 ug/min via INTRAVENOUS
  Administered 2020-06-17: 100 ug/min via INTRAVENOUS
  Administered 2020-06-17: 20 ug/min via INTRAVENOUS
  Administered 2020-06-17: 120 ug/min via INTRAVENOUS
  Filled 2020-06-17 (×5): qty 250

## 2020-06-17 MED ORDER — ACETAMINOPHEN 325 MG PO TABS
650.0000 mg | ORAL_TABLET | Freq: Four times a day (QID) | ORAL | Status: DC | PRN
  Administered 2020-06-17 – 2020-06-25 (×8): 650 mg
  Filled 2020-06-17 (×8): qty 2

## 2020-06-17 MED ORDER — ONDANSETRON HCL 4 MG/2ML IJ SOLN: 4.0000 mg | Freq: Four times a day (QID) | INTRAMUSCULAR | Status: DC | PRN

## 2020-06-17 MED ORDER — DOCUSATE SODIUM 50 MG/5ML PO LIQD
100.0000 mg | Freq: Two times a day (BID) | ORAL | Status: DC
  Administered 2020-06-17 – 2020-06-23 (×11): 100 mg
  Filled 2020-06-17 (×15): qty 10

## 2020-06-17 MED ORDER — SODIUM CHLORIDE 0.9% FLUSH
10.0000 mL | Freq: Three times a day (TID) | INTRAVENOUS | Status: DC
  Administered 2020-06-17 – 2020-07-05 (×51): 10 mL

## 2020-06-17 MED ORDER — POLYETHYLENE GLYCOL 3350 17 G PO PACK: 17.0000 g | PACK | Freq: Every day | ORAL | Status: DC | PRN

## 2020-06-17 MED ORDER — MIDAZOLAM 50MG/50ML (1MG/ML) PREMIX INFUSION
0.0000 mg/h | INTRAVENOUS | Status: DC
  Administered 2020-06-17: 14:00:00 2 mg/h via INTRAVENOUS
  Administered 2020-06-18: 14:00:00 7 mg/h via INTRAVENOUS
  Administered 2020-06-18: 23:00:00 1 mg/h via INTRAVENOUS
  Administered 2020-06-18: 2 mg/h via INTRAVENOUS
  Administered 2020-06-19 – 2020-06-21 (×7): 7 mg/h via INTRAVENOUS
  Administered 2020-06-21 – 2020-06-22 (×2): 5 mg/h via INTRAVENOUS
  Administered 2020-06-22: 2 mg/h via INTRAVENOUS
  Administered 2020-06-23 – 2020-06-25 (×2): 1 mg/h via INTRAVENOUS
  Filled 2020-06-17 (×16): qty 50

## 2020-06-17 MED ORDER — SIMVASTATIN 20 MG PO TABS
40.0000 mg | ORAL_TABLET | Freq: Every day | ORAL | Status: DC
  Administered 2020-06-18 – 2020-07-09 (×21): 40 mg
  Filled 2020-06-17 (×23): qty 2

## 2020-06-17 MED ORDER — ACETAMINOPHEN 650 MG RE SUPP: 650.0000 mg | Freq: Four times a day (QID) | RECTAL | Status: DC | PRN

## 2020-06-17 MED ORDER — HYDROCOD POLST-CPM POLST ER 10-8 MG/5ML PO SUER: 5.0000 mL | Freq: Two times a day (BID) | ORAL | Status: DC

## 2020-06-17 MED ORDER — SODIUM CHLORIDE 0.9 % IV SOLN
2.0000 g | INTRAVENOUS | Status: AC
  Administered 2020-06-17 – 2020-06-22 (×6): 2 g via INTRAVENOUS
  Filled 2020-06-17 (×6): qty 20

## 2020-06-17 MED ORDER — SENNA 8.6 MG PO TABS
2.0000 | ORAL_TABLET | Freq: Every day | ORAL | Status: DC
  Administered 2020-06-17 – 2020-06-22 (×6): 17.2 mg
  Filled 2020-06-17 (×8): qty 2

## 2020-06-17 MED ORDER — ALUM & MAG HYDROXIDE-SIMETH 200-200-20 MG/5ML PO SUSP
30.0000 mL | ORAL | Status: DC | PRN
  Administered 2020-06-17 (×2): 30 mL

## 2020-06-17 MED ORDER — VITAL HIGH PROTEIN PO LIQD
1000.0000 mL | ORAL | Status: DC
  Administered 2020-06-17: 1000 mL

## 2020-06-17 MED ORDER — VITAL 1.5 CAL PO LIQD
1000.0000 mL | ORAL | Status: DC
  Administered 2020-06-17 – 2020-07-09 (×20): 1000 mL
  Filled 2020-06-17 (×18): qty 1000

## 2020-06-17 MED ORDER — IPRATROPIUM-ALBUTEROL 0.5-2.5 (3) MG/3ML IN SOLN
3.0000 mL | RESPIRATORY_TRACT | Status: DC | PRN
  Administered 2020-06-21 – 2020-06-26 (×5): 3 mL via RESPIRATORY_TRACT
  Filled 2020-06-17 (×4): qty 3

## 2020-06-17 MED ORDER — MIDAZOLAM BOLUS VIA INFUSION
1.0000 mg | INTRAVENOUS | Status: DC | PRN
  Administered 2020-06-18 (×2): 1 mg via INTRAVENOUS
  Administered 2020-06-22 – 2020-06-23 (×2): 2 mg via INTRAVENOUS
  Administered 2020-06-23: 1 mg via INTRAVENOUS
  Administered 2020-06-23 – 2020-06-25 (×2): 2 mg via INTRAVENOUS
  Filled 2020-06-17: qty 2

## 2020-06-17 MED ORDER — ONDANSETRON HCL 4 MG PO TABS: 4.0000 mg | ORAL_TABLET | Freq: Four times a day (QID) | ORAL | Status: DC | PRN

## 2020-06-17 NOTE — Progress Notes (Signed)
Noted left chest area with what appears to be subcutaneous air devoloping. Furthermore, significant air leak is also noted in the air leak chamber of the chest tube. E-link made aware. No further orders at this time.

## 2020-06-17 NOTE — Progress Notes (Signed)
eLink Physician-Brief Progress Note Patient Name: Rachel [name] DOB: 06/04/1955 MRN: [id number]   Date of Service  06/17/2020  HPI/Events of Note  There's a concern regarding how much fluid patient is getting with her Phenylephrine infusion. She has severe respiratory failure.  eICU Interventions  Phenylephrine infusion quad concentrated.        Thomasene Lot Jas Betten 06/17/2020, 10:11 PM

## 2020-06-17 NOTE — Progress Notes (Signed)
Nutrition Follow-up  DOCUMENTATION CODES:   Not applicable  INTERVENTION:   Tube Feeding via OG:  Vital 1.5 at 60 ml/hr Pro-Source TF 45 mL daily Provides 2200 kcals, 108 g of protein, 1094 mL of free water   NUTRITION DIAGNOSIS:   Increased nutrient needs related to acute illness (COVID-19 pneumonia) as evidenced by estimated needs.  Being addressed via TF   GOAL:   Provide needs based on ASPEN/SCCM guidelines  Progressing  MONITOR:   Vent status,TF tolerance,Labs,Weight trends  REASON FOR ASSESSMENT:   LOS    ASSESSMENT:   Patient transferred to ICU for heated high flow with BiPAP. She  presented with acute hypoxic respiratory failure due to COVID-19 pneumonia. Complaint of decreased appetite and general weakness prior to admission.   2/18 Admitted to [facility]  3/05 Intubated, transferred to [facility] 3/06 L pneumothorax with pigtail cath placed  Pt remains on vent support, febrile, OG tube enters stomach per chest xray  Weight appears relatively stable; current wt 70.2 kg; weight 70.9 kg on 2/26. Mild edema present  Labs: phosphorus 2.2 (L) Meds: colace, ss novolog, solumedrol, senna, miralax   Diet Order:   Diet Order            Diet NPO time specified  Diet effective now                 EDUCATION NEEDS:   Not appropriate for education at this time  Skin:  Skin Assessment: Reviewed RN Assessment  Last BM:  2/28, +flatus  Height:   Ht Readings from Last 1 Encounters:  06/15/20 5\' 4"  (1.626 m)    Weight:   Wt Readings from Last 1 Encounters:  06/17/20 70.2 kg    BMI:  Body mass index is 26.57 kg/m.  Estimated Nutritional Needs:   Kcal:  2100-2400 kcals  Protein:  105-120 g  Fluid:  >2000 ml daily   08/17/20 MS, RDN, LDN, CNSC Registered Dietitian III Clinical Nutrition RD Pager and On-Call Pager Number Located in [place]

## 2020-06-17 NOTE — Progress Notes (Addendum)
Pt developed worsening hypoxia and vent dys-synchrony.  Noted to no longer have air leak with Lt pig tail chest tube.  Chest tube flushed and air leak noted again.  Changed sedation.  Oxygenation improved and patient had better vent synchrony.  Suspect she had clogging of pig tail catheter that caused acute change, and improved after pig tail unclogged.  Will continue flushing bid.  I updated her pt's husband about current status.  Also reviewed hospital policy for family members visiting in the ICU when patient has COVID 19 infection.  Offered video conference option for other family members who would like to camera into Mrs. Feldner's room while she is in ICU, and ICU staff will coordinate with family.  Coralyn Helling, MD [facility] Pulmonary/Critical Care Pager - [phone] 06/17/2020, 2:50 PM

## 2020-06-17 NOTE — Progress Notes (Signed)
NAMEJanessa [name], MRN:  [id number], DOB:  1955-11-21, LOS: 17 ADMISSION DATE:  05/31/2020, CONSULTATION DATE: 06/15/2020 REFERRING MD: [facility], CHIEF COMPLAINT: Acute respiratory failure secondary to Covid with subsequent possible acquired pneumonia  Brief History:  65 yo female former smoker presented to [facility] on 05/31/20 with one week of progressive dyspnea from upper respiratory tract infection.  Found to have severe hypoxia with SpO2 50%.  She hadn't received COVID 19 vaccination.  She was treated for COVID 19 pneumonia with steroids, remdesivir and tocilizumab on 2/19.  She was started on ABx for possible HCAP.  She ultimately required intubation and transferred to [facility].  Pneumococcal urine antigen positive.  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  Remains on deep sedation.  Lt chest tube with air leak.  Objective   Blood pressure 120/70, pulse 94, temperature 98.24 F (36.8 C), resp. rate (!) 34, height 5\' 4"  (1.626 m), weight 70.2 kg, SpO2 100 %.    Vent Mode: PRVC FiO2 (%):  [80 %-100 %] 80 % Set Rate:  [34 bmp] 34 bmp Vt Set:  [380 mL] 380 mL PEEP:  [10 cmH20] 10 cmH20 Plateau Pressure:   [22 cmH20-23 cmH20] 22 cmH20   Intake/Output Summary (Last 24 hours) at 06/17/2020 0840 Last data filed at 06/17/2020 0600 Gross per 24 hour  Intake 2836.72 ml  Output 1045 ml  Net 1791.72 ml   Filed Weights   06/08/20 0500 06/14/20 0900 06/17/20 0500  Weight: 70.9 kg 69.7 kg 70.2 kg    Examination:  General - sedated Eyes - pupils reactive ENT - ETT in place Cardiac - regular rate/rhythm, no murmur Chest - b/l rhonchi, Lt chest tube with +3 air leak, mild crepitus on Lt Abdomen - soft, non tender, + bowel sounds Extremities - no cyanosis, clubbing, or edema Skin - no rashes Neuro - RASS -3  Resolved Hospital Problem list     Assessment & Plan:   Acute hypoxic/hypercapnic respiratory failure from COVID 19 pneumonia with ARDS complicated by Pneumococcal bacterial pneumonia. - goal SpO2 90 to 95, pH > 7.20 - goal plateau pressure < 30, driving force < 15 - Vt goal 6 cc/kg - f/u CXR, ABG - day 3 of ABx, change to rocephin - prn BDs, - change solumedrol to 20 mg bid and wean off as tolerated  Lt pneumothorax. - pig tail catheter to -20 cm suction  Acute metabolic encephalopathy 2nd to hypoxia/hypercapnia. - RASS goal -1 to -2 - elevated triglyceride level >> d/c diprivan and change to precedex  Anemia of critical illness. - f/u CBC - transfuse for Hb < 7 or significant bleeding  Steroid induced hyperglycemia. -  SSI  Hx of HLD. - resume simvastatin  Best practice (evaluated daily)  Diet: tube feeds DVT prophylaxis: lovenox GI prophylaxis: protonix Mobility: Bedrest Disposition: ICU  Code Status: Full  Labs    CMP Latest Ref Rng & Units 06/17/2020 06/16/2020 06/16/2020  Glucose 70 - 99 mg/dL 448(J) - -  BUN 8 - 23 mg/dL 85(U) - -  Creatinine 3.14 - 1.00 mg/dL 9.70 - -  Sodium 263 - 145 mmol/L 139 139 137  Potassium 3.5 - 5.1 mmol/L 4.2 4.2 4.8  Chloride 98 - 111 mmol/L 110 - -  CO2 22 - 32 mmol/L 22 - -  Calcium 8.9 - 10.3 mg/dL 7.0(L) - -  Total Protein  6.5 - 8.1 g/dL - - -  Total Bilirubin 0.3 - 1.2 mg/dL - - -  Alkaline Phos 38 - 126 U/L - - -  AST 15 - 41 U/L - - -  ALT 0 - 44 U/L - - -    CBC Latest Ref Rng & Units 06/17/2020 06/16/2020 06/16/2020  WBC 4.0 - 10.5 K/uL 13.8(H) - -  Hemoglobin 12.0 - 15.0 g/dL 10.4(L) 11.2(L) 12.9  Hematocrit 36.0 - 46.0 % 33.7(L) 33.0(L) 38.0  Platelets 150 - 400 K/uL 186 - -    ABG    Component Value Date/Time   PHART 7.235 (L) 06/16/2020 1604   PCO2ART 62.5 (H) 06/16/2020 1604   PO2ART 222 (H) 06/16/2020 1604   HCO3 26.5 06/16/2020 1604   TCO2 28 06/16/2020 1604   ACIDBASEDEF 2.0 06/16/2020 1604   O2SAT 100.0 06/16/2020 1604    CBG (last 3)  Recent Labs    06/16/20 2321 06/17/20 0439 06/17/20 0728  GLUCAP 144* 157* 150*    Critical care time: 37 minutes  Coralyn Helling, MD [place] [facility]/Critical Care Pager - [phone] 06/17/2020, 9:03 AM

## 2020-06-17 NOTE — Plan of Care (Signed)
  Problem: Clinical Measurements: Goal: Will remain free from infection Outcome: Progressing Goal: Diagnostic test results will improve Outcome: Progressing   

## 2020-06-18 ENCOUNTER — Inpatient Hospital Stay (HOSPITAL_COMMUNITY): Payer: BLUE CROSS/BLUE SHIELD

## 2020-06-18 DIAGNOSIS — J9601 Acute respiratory failure with hypoxia: Secondary | ICD-10-CM | POA: Diagnosis not present

## 2020-06-18 DIAGNOSIS — J1282 Pneumonia due to coronavirus disease 2019: Secondary | ICD-10-CM | POA: Diagnosis not present

## 2020-06-18 DIAGNOSIS — J13 Pneumonia due to Streptococcus pneumoniae: Secondary | ICD-10-CM | POA: Diagnosis not present

## 2020-06-18 DIAGNOSIS — U071 COVID-19: Secondary | ICD-10-CM | POA: Diagnosis not present

## 2020-06-18 LAB — POCT I-STAT 7, (LYTES, BLD GAS, ICA,H+H)
Acid-base deficit: 7 mmol/L — ABNORMAL HIGH (ref 0.0–2.0)
Acid-base deficit: 5 mmol/L — ABNORMAL HIGH (ref 0.0–2.0)
Bicarbonate: 19.5 mmol/L — ABNORMAL LOW (ref 20.0–28.0)
Bicarbonate: 22.2 mmol/L (ref 20.0–28.0)
Calcium, Ion: 1.03 mmol/L — ABNORMAL LOW (ref 1.15–1.40)
Calcium, Ion: 1.03 mmol/L — ABNORMAL LOW (ref 1.15–1.40)
HCT: 28 % — ABNORMAL LOW (ref 36.0–46.0)
HCT: 29 % — ABNORMAL LOW (ref 36.0–46.0)
Hemoglobin: 9.5 g/dL — ABNORMAL LOW (ref 12.0–15.0)
Hemoglobin: 9.9 g/dL — ABNORMAL LOW (ref 12.0–15.0)
O2 Saturation: 95 %
O2 Saturation: 93 %
Patient temperature: 38
Patient temperature: 101.3
Potassium: 4.8 mmol/L (ref 3.5–5.1)
Potassium: 5.4 mmol/L — ABNORMAL HIGH (ref 3.5–5.1)
Sodium: 139 mmol/L (ref 135–145)
Sodium: 140 mmol/L (ref 135–145)
TCO2: 21 mmol/L — ABNORMAL LOW (ref 22–32)
TCO2: 24 mmol/L (ref 22–32)
pCO2 arterial: 42.1 mmHg (ref 32.0–48.0)
pCO2 arterial: 54 mmHg — ABNORMAL HIGH (ref 32.0–48.0)
pH, Arterial: 7.278 — ABNORMAL LOW (ref 7.350–7.450)
pH, Arterial: 7.229 — ABNORMAL LOW (ref 7.350–7.450)
pO2, Arterial: 87 mmHg (ref 83.0–108.0)
pO2, Arterial: 85 mmHg (ref 83.0–108.0)

## 2020-06-18 LAB — MAGNESIUM
Magnesium: 3.2 mg/dL — ABNORMAL HIGH (ref 1.7–2.4)
Magnesium: 3.5 mg/dL — ABNORMAL HIGH (ref 1.7–2.4)

## 2020-06-18 LAB — CBC
HCT: 32.2 % — ABNORMAL LOW (ref 36.0–46.0)
Hemoglobin: 10.1 g/dL — ABNORMAL LOW (ref 12.0–15.0)
MCH: 29.5 pg (ref 26.0–34.0)
MCHC: 31.4 g/dL (ref 30.0–36.0)
MCV: 94.2 fL (ref 80.0–100.0)
Platelets: 248 10*3/uL (ref 150–400)
RBC: 3.42 MIL/uL — ABNORMAL LOW (ref 3.87–5.11)
RDW: 15.9 % — ABNORMAL HIGH (ref 11.5–15.5)
WBC: 18.3 10*3/uL — ABNORMAL HIGH (ref 4.0–10.5)
nRBC: 0 % (ref 0.0–0.2)

## 2020-06-18 LAB — GLUCOSE, CAPILLARY
Glucose-Capillary: 143 mg/dL — ABNORMAL HIGH (ref 70–99)
Glucose-Capillary: 152 mg/dL — ABNORMAL HIGH (ref 70–99)
Glucose-Capillary: 175 mg/dL — ABNORMAL HIGH (ref 70–99)
Glucose-Capillary: 197 mg/dL — ABNORMAL HIGH (ref 70–99)
Glucose-Capillary: 226 mg/dL — ABNORMAL HIGH (ref 70–99)
Glucose-Capillary: 232 mg/dL — ABNORMAL HIGH (ref 70–99)

## 2020-06-18 LAB — BASIC METABOLIC PANEL
Anion gap: 10 (ref 5–15)
Anion gap: 10 (ref 5–15)
BUN: 68 mg/dL — ABNORMAL HIGH (ref 8–23)
BUN: 88 mg/dL — ABNORMAL HIGH (ref 8–23)
CO2: 17 mmol/L — ABNORMAL LOW (ref 22–32)
CO2: 21 mmol/L — ABNORMAL LOW (ref 22–32)
Calcium: 6.7 mg/dL — ABNORMAL LOW (ref 8.9–10.3)
Calcium: 6.9 mg/dL — ABNORMAL LOW (ref 8.9–10.3)
Chloride: 112 mmol/L — ABNORMAL HIGH (ref 98–111)
Chloride: 108 mmol/L (ref 98–111)
Creatinine, Ser: 1.57 mg/dL — ABNORMAL HIGH (ref 0.44–1.00)
Creatinine, Ser: 2.03 mg/dL — ABNORMAL HIGH (ref 0.44–1.00)
GFR, Estimated: 37 mL/min — ABNORMAL LOW (ref >60)
GFR, Estimated: 27 mL/min — ABNORMAL LOW (ref >60)
Glucose, Bld: 194 mg/dL — ABNORMAL HIGH (ref 70–99)
Glucose, Bld: 256 mg/dL — ABNORMAL HIGH (ref 70–99)
Potassium: 4.9 mmol/L (ref 3.5–5.1)
Potassium: 5 mmol/L (ref 3.5–5.1)
Sodium: 139 mmol/L (ref 135–145)
Sodium: 139 mmol/L (ref 135–145)

## 2020-06-18 LAB — PHOSPHORUS
Phosphorus: 3 mg/dL (ref 2.5–4.6)
Phosphorus: 3.4 mg/dL (ref 2.5–4.6)

## 2020-06-18 LAB — TRIGLYCERIDES: Triglycerides: 191 mg/dL — ABNORMAL HIGH (ref <150)

## 2020-06-18 MED ORDER — BISACODYL 10 MG RE SUPP
10.0000 mg | Freq: Once | RECTAL | Status: AC
  Administered 2020-06-18: 10 mg via RECTAL
  Filled 2020-06-18: qty 1

## 2020-06-18 MED ORDER — ENOXAPARIN SODIUM 30 MG/0.3ML ~~LOC~~ SOLN
30.0000 mg | Freq: Every day | SUBCUTANEOUS | Status: DC
  Administered 2020-06-18 – 2020-06-27 (×10): 30 mg via SUBCUTANEOUS
  Filled 2020-06-18 (×10): qty 0.3

## 2020-06-18 MED ORDER — STERILE WATER FOR INJECTION IV SOLN
INTRAVENOUS | Status: DC
  Filled 2020-06-18 (×5): qty 850

## 2020-06-18 NOTE — Progress Notes (Signed)
Assisted tele visit to patient with family member.  Zehra Rucci R, RN  

## 2020-06-18 NOTE — Progress Notes (Signed)
NAMELatifa [name], MRN:  [id number], DOB:  [birth year], LOS: 18 ADMISSION DATE:  05/31/2020, CONSULTATION DATE: 06/15/2020 REFERRING MD: [facility], CHIEF COMPLAINT: Acute respiratory failure secondary to Covid with subsequent possible acquired pneumonia  Brief History:  65 yo female former smoker presented to [facility] on 05/31/20 with one week of progressive dyspnea from upper respiratory tract infection.  Found to have severe hypoxia with SpO2 50%.  She hadn't received COVID 19 vaccination.  She was treated for COVID 19 pneumonia with steroids, remdesivir and tocilizumab on 2/19.  She was started on ABx for possible HCAP.  She ultimately required intubation and transferred to [facility].  Pneumococcal urine antigen positive.  Past Medical History:  GERD, HLD, Nephrolithiasis  Significant Hospital Events:  2/18 Admit to [facility], start steroids and remdesivir, treated with tocilizumab 3/03 increasing O2 requirements, started on Bipap 3/05 intubated, transferred to [facility] 3/06 Lt pneumothorax >> pig tail catheter placed 3/07 hypoxia/hypotension/vent dys-synchrony >> improved after chest tube flushed  Consults:    Procedures:  ETT 3/05 >> Rt radial a line 3/06 >> Lt IJ CVL 3/06 >> Lt pig tail catheter 3/06 >>  Significant Diagnostic Tests:   CT chest 2/18 >> patchy b/l ASD  Doppler legs b/l 2/21 >> no DVT, 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  Objective   Blood pressure 129/67, pulse (!) 109, temperature (!) 101.1 F (38.4 C), resp. rate 11, height 5\' 4"  (1.626 m), weight 71.3 kg, SpO2 96 %.    Vent Mode: PRVC FiO2 (%):  [50 %-80 %] 70 % Set Rate:  [30 bmp] 30 bmp Vt Set:  [380 mL] 380 mL PEEP:   [8 cmH20] 8 cmH20 Plateau Pressure:  [17 cmH20-22 cmH20] 17 cmH20   Intake/Output Summary (Last 24 hours) at 06/18/2020 0825 Last data filed at 06/18/2020 08/18/2020 Gross per 24 hour  Intake 3265.21 ml  Output 880 ml  Net 2385.21 ml   Filed Weights   06/14/20 0900 06/17/20 0500 06/18/20 0456  Weight: 69.7 kg 70.2 kg 71.3 kg    Examination:  General - sedated Eyes - pupils reactive ENT - ETT in place Cardiac - regular, tachycardic, no murmur Chest - b/l crackles and squeaks, Lt chest tube with +3 air leak Abdomen - soft, non tender, decreased bowel sounds Extremities - 1+ edema Skin - no rashes Neuro - RASS -3  Resolved Hospital Problem list     Assessment & Plan:   Acute hypoxic/hypercapnic respiratory failure from COVID 19 pneumonia with ARDS complicated by Pneumococcal bacterial pneumonia. - goal Vt 6 cc/kg, plateau pressure < 30, driving force < 15 - goal SpO2 88 to 95% - allow for permissive hypercapnia, goal pH > 7.20 - f/u CXR - day 4 of ABx, currently on rocephin - d/c steroids 3/08  Lt pneumothorax. - pig tail catheter to -20 cm suction - ensure catheter flushed every shift  Acute metabolic encephalopathy 2nd to hypoxia/hypercapnia. - RASS goal -1 to -2 - elevated triglyceride level >> d/c'ed diprivan on 3/07 - continue versed, fentanyl gtt  AKI from ATN 2nd to hypoxia, hypotension. Non gap metabolic acidosis. - add HCO3 to IV fluid - f/u BMET, ABG - monitor urine outpt -  no indication for renal replacement at this time  Anemia of critical illness. - f/u CBC - transfuse for Hb < 7 or significant bleeding  Steroid induced hyperglycemia. - SSI  Hx of HLD. - continue simvastatin  Constipation. - adjust bowel regimen  Best practice (evaluated daily)  Diet: tube feeds DVT prophylaxis: lovenox GI prophylaxis: protonix Mobility: Bedrest Disposition: ICU  Code Status: Full  Labs    CMP Latest Ref Rng & Units 06/18/2020 06/18/2020 06/17/2020  Glucose  70 - 99 mg/dL 297(L) - 892(J)  BUN 8 - 23 mg/dL 19(E) - 17(E)  Creatinine 0.44 - 1.00 mg/dL 0.81(K) - 4.81  Sodium 135 - 145 mmol/L 139 139 139  Potassium 3.5 - 5.1 mmol/L 4.9 4.8 4.2  Chloride 98 - 111 mmol/L 112(H) - 110  CO2 22 - 32 mmol/L 17(L) - 22  Calcium 8.9 - 10.3 mg/dL 6.7(L) - 7.0(L)  Total Protein 6.5 - 8.1 g/dL - - -  Total Bilirubin 0.3 - 1.2 mg/dL - - -  Alkaline Phos 38 - 126 U/L - - -  AST 15 - 41 U/L - - -  ALT 0 - 44 U/L - - -    CBC Latest Ref Rng & Units 06/18/2020 06/18/2020 06/17/2020  WBC 4.0 - 10.5 K/uL 18.3(H) - 13.8(H)  Hemoglobin 12.0 - 15.0 g/dL 10.1(L) 9.5(L) 10.4(L)  Hematocrit 36.0 - 46.0 % 32.2(L) 28.0(L) 33.7(L)  Platelets 150 - 400 K/uL 248 - 186    ABG    Component Value Date/Time   PHART 7.278 (L) 06/18/2020 0315   PCO2ART 42.1 06/18/2020 0315   PO2ART 87 06/18/2020 0315   HCO3 19.5 (L) 06/18/2020 0315   TCO2 21 (L) 06/18/2020 0315   ACIDBASEDEF 7.0 (H) 06/18/2020 0315   O2SAT 95.0 06/18/2020 0315    CBG (last 3)  Recent Labs    06/17/20 2316 06/18/20 0321 06/18/20 0727  GLUCAP 119* 152* 143*    Critical care time: 36 minutes  Coralyn Helling, MD [place] [facility]/Critical Care Pager - [phone] 06/18/2020, 8:25 AM

## 2020-06-18 NOTE — Plan of Care (Signed)
  Problem: Clinical Measurements: Goal: Will remain free from infection Outcome: Not Progressing   

## 2020-06-18 NOTE — Progress Notes (Signed)
eLink Physician-Brief Progress Note Patient Name: Rachel [name] DOB: 1955/06/16 MRN: [id number]   Date of Service  06/18/2020  HPI/Events of Note  ABG resulted.  eICU Interventions  ABG result reviewed.        Rachel [name] Rachel [name] 06/18/2020, 3:52 AM

## 2020-06-19 ENCOUNTER — Inpatient Hospital Stay (HOSPITAL_COMMUNITY): Payer: BLUE CROSS/BLUE SHIELD

## 2020-06-19 DIAGNOSIS — J9601 Acute respiratory failure with hypoxia: Secondary | ICD-10-CM | POA: Diagnosis not present

## 2020-06-19 DIAGNOSIS — J13 Pneumonia due to Streptococcus pneumoniae: Secondary | ICD-10-CM | POA: Diagnosis not present

## 2020-06-19 DIAGNOSIS — U071 COVID-19: Secondary | ICD-10-CM | POA: Diagnosis not present

## 2020-06-19 DIAGNOSIS — J1282 Pneumonia due to coronavirus disease 2019: Secondary | ICD-10-CM | POA: Diagnosis not present

## 2020-06-19 LAB — BLOOD GAS, ARTERIAL
Acid-base deficit: 2.9 mmol/L — ABNORMAL HIGH (ref 0.0–2.0)
Bicarbonate: 23.1 mmol/L (ref 20.0–28.0)
Drawn by: 60057
FIO2: 70
O2 Saturation: 94.3 %
Patient temperature: 37.9
pCO2 arterial: 55.2 mmHg — ABNORMAL HIGH (ref 32.0–48.0)
pH, Arterial: 7.251 — ABNORMAL LOW (ref 7.350–7.450)
pO2, Arterial: 77.9 mmHg — ABNORMAL LOW (ref 83.0–108.0)

## 2020-06-19 LAB — CBC
HCT: 28.4 % — ABNORMAL LOW (ref 36.0–46.0)
Hemoglobin: 8.6 g/dL — ABNORMAL LOW (ref 12.0–15.0)
MCH: 29 pg (ref 26.0–34.0)
MCHC: 30.3 g/dL (ref 30.0–36.0)
MCV: 95.6 fL (ref 80.0–100.0)
Platelets: 167 10*3/uL (ref 150–400)
RBC: 2.97 MIL/uL — ABNORMAL LOW (ref 3.87–5.11)
RDW: 16.6 % — ABNORMAL HIGH (ref 11.5–15.5)
WBC: 12.6 10*3/uL — ABNORMAL HIGH (ref 4.0–10.5)
nRBC: 0.2 % (ref 0.0–0.2)

## 2020-06-19 LAB — COMPREHENSIVE METABOLIC PANEL
ALT: 1084 U/L — ABNORMAL HIGH (ref 0–44)
AST: 750 U/L — ABNORMAL HIGH (ref 15–41)
Albumin: 1.5 g/dL — ABNORMAL LOW (ref 3.5–5.0)
Alkaline Phosphatase: 98 U/L (ref 38–126)
Anion gap: 12 (ref 5–15)
BUN: 97 mg/dL — ABNORMAL HIGH (ref 8–23)
CO2: 20 mmol/L — ABNORMAL LOW (ref 22–32)
Calcium: 6.8 mg/dL — ABNORMAL LOW (ref 8.9–10.3)
Chloride: 108 mmol/L (ref 98–111)
Creatinine, Ser: 2.26 mg/dL — ABNORMAL HIGH (ref 0.44–1.00)
GFR, Estimated: 24 mL/min — ABNORMAL LOW (ref >60)
Glucose, Bld: 217 mg/dL — ABNORMAL HIGH (ref 70–99)
Potassium: 4.7 mmol/L (ref 3.5–5.1)
Sodium: 140 mmol/L (ref 135–145)
Total Bilirubin: 0.6 mg/dL (ref 0.3–1.2)
Total Protein: 4.4 g/dL — ABNORMAL LOW (ref 6.5–8.1)

## 2020-06-19 LAB — GLUCOSE, CAPILLARY
Glucose-Capillary: 171 mg/dL — ABNORMAL HIGH (ref 70–99)
Glucose-Capillary: 177 mg/dL — ABNORMAL HIGH (ref 70–99)
Glucose-Capillary: 179 mg/dL — ABNORMAL HIGH (ref 70–99)
Glucose-Capillary: 181 mg/dL — ABNORMAL HIGH (ref 70–99)
Glucose-Capillary: 196 mg/dL — ABNORMAL HIGH (ref 70–99)
Glucose-Capillary: 208 mg/dL — ABNORMAL HIGH (ref 70–99)
Glucose-Capillary: 212 mg/dL — ABNORMAL HIGH (ref 70–99)

## 2020-06-19 MED ORDER — SODIUM ZIRCONIUM CYCLOSILICATE 10 G PO PACK
10.0000 g | PACK | Freq: Once | ORAL | Status: AC
  Administered 2020-06-19: 10 g
  Filled 2020-06-19: qty 1

## 2020-06-19 NOTE — Progress Notes (Signed)
Pt rings that were in the specimen cup on computer in room given to spouse. His name is Rachel [name].

## 2020-06-19 NOTE — Progress Notes (Signed)
NAMEShawana [name], MRN:  [id number], DOB:  [birth year], LOS: 19 ADMISSION DATE:  05/31/2020, CONSULTATION DATE: 06/15/2020 REFERRING MD: [facility], CHIEF COMPLAINT: Acute respiratory failure secondary to Covid with subsequent possible acquired pneumonia  Brief History:  65 yo female former smoker presented to [facility] on 05/31/20 with one week of progressive dyspnea from upper respiratory tract infection.  Found to have severe hypoxia with SpO2 50%.  She hadn't received COVID 19 vaccination.  She was treated for COVID 19 pneumonia with steroids, remdesivir and tocilizumab on 2/19.  She was started on ABx for possible HCAP.  She ultimately required intubation and transferred to [facility].  Pneumococcal urine antigen positive.  Past Medical History:  GERD, HLD, Nephrolithiasis  Significant Hospital Events:  2/18 Admit to [facility], start steroids and remdesivir, treated with tocilizumab 3/03 increasing O2 requirements, started on Bipap 3/05 intubated, transferred to [facility] 3/06 Lt pneumothorax >> pig tail catheter placed 3/07 hypoxia/hypotension/vent dys-synchrony >> improved after chest tube flushed 3/8 d/c steroids, start HCO3 gtt  Consults:    Procedures:  ETT 3/05 >> Rt radial a line 3/06 >> Lt IJ CVL 3/06 >> Lt pig tail catheter 3/06 >>  Significant Diagnostic Tests:   CT chest 2/18 >> patchy b/l ASD  Doppler legs b/l 2/21 >> no DVT, 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  Still has air leak.  Remains on pressors.  Objective   Blood pressure 98/60, pulse 91, temperature 99.5 F (37.5 C), resp. rate (!) 23, height 5\' 4"  (1.626 m), weight 80.1 kg, SpO2 97 %.    Vent Mode: PRVC FiO2 (%):  [60 %-70 %] 60 % Set Rate:  [30  bmp-32 bmp] 32 bmp Vt Set:  [380 mL] 380 mL PEEP:  [8 cmH20] 8 cmH20 Plateau Pressure:  [23 cmH20-25 cmH20] 25 cmH20   Intake/Output Summary (Last 24 hours) at 06/19/2020 0910 Last data filed at 06/19/2020 0600 Gross per 24 hour  Intake 2639.98 ml  Output 280 ml  Net 2359.98 ml   Filed Weights   06/17/20 0500 06/18/20 0456 06/19/20 0258  Weight: 70.2 kg 79.2 kg 80.1 kg    Examination:  General - sedated Eyes - pupils pinpoint and reactive ENT - ETT in place Cardiac - regular rate/rhythm, no murmur Chest - better air movement on Lt, scattered rhonchi, Lt pig tail with 3+ air leak Abdomen - soft, non tender, + bowel sounds Extremities - no cyanosis, clubbing, or edema Skin - no rashes Neuro - RASS -3  Resolved Hospital Problem list     Assessment & Plan:   Acute hypoxic/hypercapnic respiratory failure from COVID 19 pneumonia with ARDS complicated by Pneumococcal bacterial pneumonia. - goal Vt 6 cc/kg, plateau pressure < 30, driving force < 15 - goal SpO2 88 to 95% - allow for permissive hypercapnia, goal pH > 7.20 - f/u CXR - day 5 of ABx, currently on rocephin  Lt pneumothorax. - CXR on 3/9 shows some enlargement in PTX, but clinically better and better O2 requirements - continue pig tail catheter to -20 cm suction and f/u CXR; defer placing addition chest tube for now - flush catheter every shift  Acute metabolic encephalopathy 2nd to hypoxia/hypercapnia. - RASS goal -2 -  elevated triglyceride level >> d/c'ed diprivan on 3/07 - continue versed, fentanyl gtt  AKI from ATN 2nd to hypoxia, hypotension. Non gap metabolic acidosis. - baseline creatinine 0.79 from 05/31/20 - continue HCO3 gtt - f/u BMET, ABG - monitor urine outpt - no indication for renal replacement at this time  Elevated LFTs. - likely from hypoxia, hypotension - f/u CMET  Anemia of critical illness. - f/u CBC - transfuse for Hb < 7 or significant bleeding  Steroid induced hyperglycemia. -  SSI  Hx of HLD. - continue simvastatin >> if LFTs continue to rise, then might need to hold this  Constipation. - continue bowel regimen  Best practice (evaluated daily)  Diet: tube feeds DVT prophylaxis: lovenox GI prophylaxis: protonix Mobility: Bedrest Disposition: ICU  Code Status: Full  Labs    CMP Latest Ref Rng & Units 06/19/2020 06/18/2020 06/18/2020  Glucose 70 - 99 mg/dL 235(T) - 614(E)  BUN 8 - 23 mg/dL 31(V) - 40(G)  Creatinine 0.44 - 1.00 mg/dL 8.67(Y) - 1.95(K)  Sodium 135 - 145 mmol/L 140 140 139  Potassium 3.5 - 5.1 mmol/L 4.7 5.4(H) 5.0  Chloride 98 - 111 mmol/L 108 - 108  CO2 22 - 32 mmol/L 20(L) - 21(L)  Calcium 8.9 - 10.3 mg/dL 9.3(O) - 6.9(L)  Total Protein 6.5 - 8.1 g/dL 6.7(T) - -  Total Bilirubin 0.3 - 1.2 mg/dL 0.6 - -  Alkaline Phos 38 - 126 U/L 98 - -  AST 15 - 41 U/L 750(H) - -  ALT 0 - 44 U/L 1,084(H) - -    CBC Latest Ref Rng & Units 06/19/2020 06/18/2020 06/18/2020  WBC 4.0 - 10.5 K/uL 12.6(H) - 18.3(H)  Hemoglobin 12.0 - 15.0 g/dL 2.4(P) 8.0(D) 10.1(L)  Hematocrit 36.0 - 46.0 % 28.4(L) 29.0(L) 32.2(L)  Platelets 150 - 400 K/uL 167 - 248    ABG    Component Value Date/Time   PHART 7.251 (L) 06/19/2020 0332   PCO2ART 55.2 (H) 06/19/2020 0332   PO2ART 77.9 (L) 06/19/2020 0332   HCO3 23.1 06/19/2020 0332   TCO2 24 06/18/2020 1803   ACIDBASEDEF 2.9 (H) 06/19/2020 0332   O2SAT 94.3 06/19/2020 0332    CBG (last 3)  Recent Labs    06/19/20 0352 06/19/20 0402 06/19/20 0829  GLUCAP 196* 208* 179*    Critical care time: 37 minutes  Rachel Helling, MD [place] [facility]monary/Critical Care Pager - [phone] 06/19/2020, 9:10 AM

## 2020-06-19 NOTE — Progress Notes (Addendum)
eLink Physician-Brief Progress Note Patient Name: Peony Barner DOB: [birth year] MRN: [id number]   Date of Service  06/19/2020  HPI/Events of Note  Multiple issues: 1. ABG on 70%/PRVC 30/TV 32/P 8 = 7.229/54.0/85 and 2. Hyperkalemia - K+ = 5.4.   eICU Interventions  Plan: 1. Increase PRVC rate to 32. 2. Increase NaHCO3 IV infusion to 75 mL/hour. 3. Repeat ABG at 5 AM. 4. Lokelma 10 gm per tube now.  5. CMP already ordered for 5 AM/     Intervention Category Major Interventions: Respiratory failure - evaluation and management;Acid-Base disturbance - evaluation and management  Aymara Sassi Eugene 06/19/2020, 12:08 AM

## 2020-06-19 NOTE — Progress Notes (Signed)
Spoke with pt's husband over the phone.  Updated about current status and treatment plan.  Coralyn Helling, MD [facility] Pulmonary/Critical Care Pager - [phone] 06/19/2020, 10:32 AM

## 2020-06-20 ENCOUNTER — Inpatient Hospital Stay (HOSPITAL_COMMUNITY): Payer: BLUE CROSS/BLUE SHIELD

## 2020-06-20 DIAGNOSIS — U071 COVID-19: Secondary | ICD-10-CM | POA: Diagnosis not present

## 2020-06-20 DIAGNOSIS — J1282 Pneumonia due to coronavirus disease 2019: Secondary | ICD-10-CM | POA: Diagnosis not present

## 2020-06-20 DIAGNOSIS — J9601 Acute respiratory failure with hypoxia: Secondary | ICD-10-CM | POA: Diagnosis not present

## 2020-06-20 DIAGNOSIS — J13 Pneumonia due to Streptococcus pneumoniae: Secondary | ICD-10-CM | POA: Diagnosis not present

## 2020-06-20 LAB — CULTURE, BLOOD (ROUTINE X 2)
Culture: NO GROWTH
Culture: NO GROWTH
Special Requests: ADEQUATE
Special Requests: ADEQUATE

## 2020-06-20 LAB — GLUCOSE, CAPILLARY
Glucose-Capillary: 208 mg/dL — ABNORMAL HIGH (ref 70–99)
Glucose-Capillary: 200 mg/dL — ABNORMAL HIGH (ref 70–99)
Glucose-Capillary: 227 mg/dL — ABNORMAL HIGH (ref 70–99)
Glucose-Capillary: 201 mg/dL — ABNORMAL HIGH (ref 70–99)
Glucose-Capillary: 213 mg/dL — ABNORMAL HIGH (ref 70–99)
Glucose-Capillary: 247 mg/dL — ABNORMAL HIGH (ref 70–99)

## 2020-06-20 LAB — COMPREHENSIVE METABOLIC PANEL
ALT: 849 U/L — ABNORMAL HIGH (ref 0–44)
AST: 297 U/L — ABNORMAL HIGH (ref 15–41)
Albumin: 1.5 g/dL — ABNORMAL LOW (ref 3.5–5.0)
Alkaline Phosphatase: 129 U/L — ABNORMAL HIGH (ref 38–126)
Anion gap: 11 (ref 5–15)
BUN: 123 mg/dL — ABNORMAL HIGH (ref 8–23)
CO2: 31 mmol/L (ref 22–32)
Calcium: 7.3 mg/dL — ABNORMAL LOW (ref 8.9–10.3)
Chloride: 100 mmol/L (ref 98–111)
Creatinine, Ser: 3.05 mg/dL — ABNORMAL HIGH (ref 0.44–1.00)
GFR, Estimated: 17 mL/min — ABNORMAL LOW (ref >60)
Glucose, Bld: 235 mg/dL — ABNORMAL HIGH (ref 70–99)
Potassium: 5 mmol/L (ref 3.5–5.1)
Sodium: 142 mmol/L (ref 135–145)
Total Bilirubin: 0.6 mg/dL (ref 0.3–1.2)
Total Protein: 4.8 g/dL — ABNORMAL LOW (ref 6.5–8.1)

## 2020-06-20 LAB — BLOOD GAS, ARTERIAL
Acid-Base Excess: 2.3 mmol/L — ABNORMAL HIGH (ref 0.0–2.0)
Bicarbonate: 28.1 mmol/L — ABNORMAL HIGH (ref 20.0–28.0)
Drawn by: 60057
FIO2: 60
O2 Saturation: 92.9 %
Patient temperature: 37.8
pCO2 arterial: 61 mmHg — ABNORMAL HIGH (ref 32.0–48.0)
pH, Arterial: 7.29 — ABNORMAL LOW (ref 7.350–7.450)
pO2, Arterial: 70.9 mmHg — ABNORMAL LOW (ref 83.0–108.0)

## 2020-06-20 LAB — CBC
HCT: 28 % — ABNORMAL LOW (ref 36.0–46.0)
Hemoglobin: 8.6 g/dL — ABNORMAL LOW (ref 12.0–15.0)
MCH: 29.2 pg (ref 26.0–34.0)
MCHC: 30.7 g/dL (ref 30.0–36.0)
MCV: 94.9 fL (ref 80.0–100.0)
Platelets: 144 10*3/uL — ABNORMAL LOW (ref 150–400)
RBC: 2.95 MIL/uL — ABNORMAL LOW (ref 3.87–5.11)
RDW: 17.1 % — ABNORMAL HIGH (ref 11.5–15.5)
WBC: 6.1 10*3/uL (ref 4.0–10.5)
nRBC: 1 % — ABNORMAL HIGH (ref 0.0–0.2)

## 2020-06-20 MED ORDER — FUROSEMIDE 10 MG/ML IJ SOLN
40.0000 mg | Freq: Once | INTRAMUSCULAR | Status: AC
  Administered 2020-06-20: 40 mg via INTRAVENOUS
  Filled 2020-06-20: qty 4

## 2020-06-20 MED ORDER — INSULIN GLARGINE 100 UNIT/ML ~~LOC~~ SOLN
10.0000 [IU] | Freq: Every day | SUBCUTANEOUS | Status: DC
  Administered 2020-06-20 – 2020-07-01 (×12): 10 [IU] via SUBCUTANEOUS
  Filled 2020-06-20 (×14): qty 0.1

## 2020-06-20 NOTE — Progress Notes (Signed)
NAMEBaani Bober, MRN:  [id number], DOB:  March 09, 1956, LOS: 20 ADMISSION DATE:  05/31/2020, CONSULTATION DATE: 06/15/2020 REFERRING MD: [facility], CHIEF COMPLAINT: Acute respiratory failure secondary to Covid with subsequent possible acquired pneumonia  Brief History:  65 yo female former smoker presented to [facility] on 05/31/20 with one week of progressive dyspnea from upper respiratory tract infection.  Found to have severe hypoxia with SpO2 50%.  She hadn't received COVID 19 vaccination.  She was treated for COVID 19 pneumonia with steroids, remdesivir and tocilizumab on 2/19.  She was started on ABx for possible HCAP.  She ultimately required intubation and transferred to [facility].  Pneumococcal urine antigen positive.  Past Medical History:  GERD, HLD, Nephrolithiasis  Significant Hospital Events:  2/18 Admit to [facility], start steroids and remdesivir, treated with tocilizumab 3/03 increasing O2 requirements, started on Bipap 3/05 intubated, transferred to [facility] 3/06 Lt pneumothorax >> pig tail catheter placed 3/07 hypoxia/hypotension/vent dys-synchrony >> improved after chest tube flushed 3/8 d/c steroids, start HCO3 gtt 3/10 d/c HCO3 gtt, off pressors  Consults:    Procedures:  ETT 3/05 >> Rt radial a line 3/06 >> Lt IJ CVL 3/06 >> Lt pig tail catheter 3/06 >>  Significant Diagnostic Tests:   CT chest 2/18 >> patchy b/l ASD  Doppler legs b/l 2/21 >> no DVT, 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   Objective   Blood pressure 102/66, pulse (!) 109, temperature 99.86 F (37.7 C), resp. rate (!) 22, height 5\' 4"  (1.626 m), weight 80.1 kg, SpO2 94 %.    Vent Mode: PRVC FiO2 (%):  [60 %] 60 % Set Rate:  [32 bmp] 32  bmp Vt Set:  [380 mL] 380 mL PEEP:  [8 cmH20] 8 cmH20 Plateau Pressure:  [21 cmH20-26 cmH20] 23 cmH20   Intake/Output Summary (Last 24 hours) at 06/20/2020 1031 Last data filed at 06/19/2020 1837 Gross per 24 hour  Intake 1109.84 ml  Output 300 ml  Net 809.84 ml   Filed Weights   06/17/20 0500 06/18/20 0456 06/19/20 0258  Weight: 70.2 kg 79.2 kg 80.1 kg    Examination:  General - sedated Eyes - pupils reactive ENT - ETT in place Cardiac - irregular, tachycardic Chest - b/l crackles, Lt chest tube with 1+ air leak Abdomen - soft, non tender, decreased bowel sounds Extremities - 3+ edema Skin - no rashes Neuro - RASS -3  Resolved Hospital Problem list   Non gap metabolic acidosis  Assessment & Plan:   Acute hypoxic/hypercapnic respiratory failure from COVID 19 pneumonia with ARDS complicated by Pneumococcal bacterial pneumonia. - goal Vt 6 cc/kg, plateau pressure < 30, driving force < 15 - goal SpO2 88 to 95% - allow for permissive hypercapnia, goal pH > 7.20 - f/u CXR - day 6/10 of ABx, currently on rocephin - lasix 40 mg IV x one on 3/10  Lt pneumothorax. - down to +1 air leak on 3/10 - continue pig tail to -20 cm suction - f/u CXR - flush catheter every shift  Acute metabolic encephalopathy 2nd to hypoxia/hypercapnia. - RASS goal -2 - elevated triglyceride level >> d/c'ed diprivan on 3/07 - continue versed, fentanyl gtt  AKI from ATN 2nd to hypoxia,  hypotension. - baseline creatinine 0.79 from 05/31/20 - d/c HCO3 gtt 3/10 - continue HCO3 gtt - f/u BMET - hopefully creatinine is starting to reach a plateau; defer nephrology consult for now  Elevated LFTs. - likely from hypoxia, hypotension - improving - f/u CMET  Anemia of critical illness. Thrombocytopenia. - f/u CBC - okay to continue lovenox for now - transfuse for Hb < 7 or significant bleeding  Steroid induced hyperglycemia. - SSI  Hx of HLD. - continue simvastatin  Constipation. -  continue bowel regimen  Best practice (evaluated daily)  Diet: tube feeds DVT prophylaxis: lovenox GI prophylaxis: protonix Mobility: Bedrest Disposition: ICU  Code Status: Full  Labs    CMP Latest Ref Rng & Units 06/20/2020 06/19/2020 06/18/2020  Glucose 70 - 99 mg/dL 025(E) 527(P) -  BUN 8 - 23 mg/dL 824(M) 35(T) -  Creatinine 0.44 - 1.00 mg/dL 6.14(E) 3.15(Q) -  Sodium 135 - 145 mmol/L 142 140 140  Potassium 3.5 - 5.1 mmol/L 5.0 4.7 5.4(H)  Chloride 98 - 111 mmol/L 100 108 -  CO2 22 - 32 mmol/L 31 20(L) -  Calcium 8.9 - 10.3 mg/dL 7.3(L) 6.8(L) -  Total Protein 6.5 - 8.1 g/dL 4.8(L) 4.4(L) -  Total Bilirubin 0.3 - 1.2 mg/dL 0.6 0.6 -  Alkaline Phos 38 - 126 U/L 129(H) 98 -  AST 15 - 41 U/L 297(H) 750(H) -  ALT 0 - 44 U/L 849(H) 1,084(H) -    CBC Latest Ref Rng & Units 06/20/2020 06/19/2020 06/18/2020  WBC 4.0 - 10.5 K/uL 6.1 12.6(H) -  Hemoglobin 12.0 - 15.0 g/dL 0.0(Q) 6.7(Y) 1.9(J)  Hematocrit 36.0 - 46.0 % 28.0(L) 28.4(L) 29.0(L)  Platelets 150 - 400 K/uL 144(L) 167 -    ABG    Component Value Date/Time   PHART 7.290 (L) 06/20/2020 0558   PCO2ART 61.0 (H) 06/20/2020 0558   PO2ART 70.9 (L) 06/20/2020 0558   HCO3 28.1 (H) 06/20/2020 0558   TCO2 24 06/18/2020 1803   ACIDBASEDEF 2.9 (H) 06/19/2020 0332   O2SAT 92.9 06/20/2020 0558    CBG (last 3)  Recent Labs    06/19/20 2346 06/20/20 0326 06/20/20 0723  GLUCAP 171* 208* 200*    Critical care time: 32 minutes  Coralyn Helling, MD [place] Pulmonary/Critical Care Pager - [phone] 06/20/2020, 10:31 AM

## 2020-06-20 NOTE — Progress Notes (Signed)
Spoke with pt's husband at bedside.  Updated about current status and treatment plan.  Coralyn Helling, MD [facility] Pulmonary/Critical Care Pager - [phone] 06/20/2020, 3:28 PM

## 2020-06-21 ENCOUNTER — Inpatient Hospital Stay (HOSPITAL_COMMUNITY): Payer: BLUE CROSS/BLUE SHIELD

## 2020-06-21 DIAGNOSIS — J1282 Pneumonia due to coronavirus disease 2019: Secondary | ICD-10-CM | POA: Diagnosis not present

## 2020-06-21 DIAGNOSIS — U071 COVID-19: Secondary | ICD-10-CM | POA: Diagnosis not present

## 2020-06-21 LAB — CULTURE, BLOOD (ROUTINE X 2)
Culture: NO GROWTH
Culture: NO GROWTH
Special Requests: ADEQUATE

## 2020-06-21 LAB — GLUCOSE, CAPILLARY
Glucose-Capillary: 150 mg/dL — ABNORMAL HIGH (ref 70–99)
Glucose-Capillary: 167 mg/dL — ABNORMAL HIGH (ref 70–99)
Glucose-Capillary: 160 mg/dL — ABNORMAL HIGH (ref 70–99)
Glucose-Capillary: 149 mg/dL — ABNORMAL HIGH (ref 70–99)
Glucose-Capillary: 157 mg/dL — ABNORMAL HIGH (ref 70–99)

## 2020-06-21 LAB — CBC
HCT: 25.6 % — ABNORMAL LOW (ref 36.0–46.0)
Hemoglobin: 8.1 g/dL — ABNORMAL LOW (ref 12.0–15.0)
MCH: 29.6 pg (ref 26.0–34.0)
MCHC: 31.6 g/dL (ref 30.0–36.0)
MCV: 93.4 fL (ref 80.0–100.0)
Platelets: 136 10*3/uL — ABNORMAL LOW (ref 150–400)
RBC: 2.74 MIL/uL — ABNORMAL LOW (ref 3.87–5.11)
RDW: 16.9 % — ABNORMAL HIGH (ref 11.5–15.5)
WBC: 6.3 10*3/uL (ref 4.0–10.5)
nRBC: 1.4 % — ABNORMAL HIGH (ref 0.0–0.2)

## 2020-06-21 LAB — COMPREHENSIVE METABOLIC PANEL
ALT: 544 U/L — ABNORMAL HIGH (ref 0–44)
AST: 112 U/L — ABNORMAL HIGH (ref 15–41)
Albumin: 1.3 g/dL — ABNORMAL LOW (ref 3.5–5.0)
Alkaline Phosphatase: 121 U/L (ref 38–126)
Anion gap: 5 (ref 5–15)
BUN: 136 mg/dL — ABNORMAL HIGH (ref 8–23)
CO2: 35 mmol/L — ABNORMAL HIGH (ref 22–32)
Calcium: 8 mg/dL — ABNORMAL LOW (ref 8.9–10.3)
Chloride: 103 mmol/L (ref 98–111)
Creatinine, Ser: 3.11 mg/dL — ABNORMAL HIGH (ref 0.44–1.00)
GFR, Estimated: 16 mL/min — ABNORMAL LOW (ref >60)
Glucose, Bld: 192 mg/dL — ABNORMAL HIGH (ref 70–99)
Potassium: 5.6 mmol/L — ABNORMAL HIGH (ref 3.5–5.1)
Sodium: 143 mmol/L (ref 135–145)
Total Bilirubin: 0.5 mg/dL (ref 0.3–1.2)
Total Protein: 4.8 g/dL — ABNORMAL LOW (ref 6.5–8.1)

## 2020-06-21 LAB — BASIC METABOLIC PANEL
Anion gap: 9 (ref 5–15)
BUN: 138 mg/dL — ABNORMAL HIGH (ref 8–23)
CO2: 33 mmol/L — ABNORMAL HIGH (ref 22–32)
Calcium: 8.2 mg/dL — ABNORMAL LOW (ref 8.9–10.3)
Chloride: 101 mmol/L (ref 98–111)
Creatinine, Ser: 3.18 mg/dL — ABNORMAL HIGH (ref 0.44–1.00)
GFR, Estimated: 16 mL/min — ABNORMAL LOW (ref >60)
Glucose, Bld: 172 mg/dL — ABNORMAL HIGH (ref 70–99)
Potassium: 5.9 mmol/L — ABNORMAL HIGH (ref 3.5–5.1)
Sodium: 143 mmol/L (ref 135–145)

## 2020-06-21 MED ORDER — FUROSEMIDE 10 MG/ML IJ SOLN
40.0000 mg | Freq: Once | INTRAMUSCULAR | Status: AC
  Administered 2020-06-21: 40 mg via INTRAVENOUS
  Filled 2020-06-21: qty 4

## 2020-06-21 NOTE — Progress Notes (Signed)
Foley catheter placed with Arline Asp,  RN

## 2020-06-21 NOTE — Progress Notes (Signed)
NAMELindi [name], MRN:  [id number], DOB:  March 11, 1956, LOS: 21 ADMISSION DATE:  05/31/2020, CONSULTATION DATE: 06/15/2020 REFERRING MD: [facility], CHIEF COMPLAINT: Acute respiratory failure secondary to Covid with subsequent possible acquired pneumonia  Brief History:  65 yo female former smoker presented to [facility] on 05/31/20 with one week of progressive dyspnea from upper respiratory tract infection.  Found to have severe hypoxia with SpO2 50%.  She hadn't received COVID 19 vaccination.  She was treated for COVID 19 pneumonia with steroids, remdesivir and tocilizumab on 2/19.  She was started on ABx for possible HCAP.  She ultimately required intubation and transferred to [facility].  Pneumococcal urine antigen positive.  Past Medical History:  GERD, HLD, Nephrolithiasis  Significant Hospital Events:  2/18 Admit to [facility], start steroids and remdesivir, treated with tocilizumab 3/03 increasing O2 requirements, started on Bipap 3/05 intubated, transferred to [facility] 3/06 Lt pneumothorax >> pig tail catheter placed 3/07 hypoxia/hypotension/vent dys-synchrony >> improved after chest tube flushed 3/8 d/c steroids, start HCO3 gtt 3/10 d/c HCO3 gtt, off pressors   Consults:    Procedures:  ETT 3/05 >> Rt radial a line 3/06 >> Lt IJ CVL 3/06 >> Lt pig tail catheter 3/06 >>  Significant Diagnostic Tests:   CT chest 2/18 >> patchy b/l ASD  Doppler legs b/l 2/21 >> no DVT, 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 Deeply sedated  Objective   Blood pressure 101/63, pulse 100, temperature 100.04 F (37.8 C), resp. rate (!) 32, height 5\' 4"  (1.626 m), weight 80.1 kg, SpO2 93 %.    Vent Mode: PRVC FiO2 (%):  [60 %] 60 % Set  Rate:  [32 bmp] 32 bmp Vt Set:  [380 mL] 380 mL PEEP:  [8 cmH20] 8 cmH20 Plateau Pressure:  [20 cmH20-24 cmH20] 24 cmH20   Intake/Output Summary (Last 24 hours) at 06/21/2020 0821 Last data filed at 06/21/2020 0600 Gross per 24 hour  Intake 875.24 ml  Output 830 ml  Net 45.24 ml   Filed Weights   06/17/20 0500 06/18/20 0456 06/19/20 0258  Weight: 70.2 kg 79.2 kg 80.1 kg    Physical Exam: General: Chronically ill-appearing, no acute distress HENT: [place], AT, ETT in place Eyes: EOMI, no scleral icterus Respiratory: Vented breath sounds bilaterally.  No crackles, wheezing or rales. Left chest tube with air leak present Cardiovascular: RRR, -M/R/G, no JVD GI: BS+, soft, nontender Extremities:Anasarca ,-tenderness Neuro: RASS -4 GU: Foley in place   Resolved Hospital Problem list   Non gap metabolic acidosis Elevated LFTs Assessment & Plan:   Acute hypoxic/hypercapnic respiratory failure from COVID 19 pneumonia with ARDS complicated by Pneumococcal bacterial pneumonia, left pneumothorax - goal Vt 6 cc/kg, plateau pressure < 30, driving force < 15 - goal SpO2 88 to 95% - allow for permissive hypercapnia, goal pH > 7.20 - f/u CXR - day 6/7 of ABx, currently on rocephin - repeat lasix 40 mg IV today - RASS goal -1: wean Versed and Fentanyl gtt  Lt pneumothorax. - down to +1 air leak on 3/10 - continue pig tail to -20 cm suction - f/u CXR - flush catheter every shift  Acute metabolic encephalopathy 2nd to hypoxia/hypercapnia. - RASS goal -1 -  Wean versed, fentanyl gtt   AKI from ATN 2nd to hypoxia, hypotension - worsening Mild hyperkalemia - baseline creatinine 0.79 from 05/31/20 - f/u BMET in PM - Diurese today. Will monitor UOP/Cr. May need to involve Nephrology if not improving  Anemia of critical illness. Thrombocytopenia. - f/u CBC - okay to continue lovenox for now - transfuse for Hb < 7 or significant bleeding  Steroid induced hyperglycemia. - SSI  Hx of  HLD. - continue simvastatin  Constipation. - continue bowel regimen  Best practice (evaluated daily)  Diet: tube feeds DVT prophylaxis: lovenox GI prophylaxis: protonix Mobility: Bedrest Disposition: ICU  Code Status: Full  Labs    CMP Latest Ref Rng & Units 06/21/2020 06/20/2020 06/19/2020  Glucose 70 - 99 mg/dL 400(Q) 676(P) 950(D)  BUN 8 - 23 mg/dL 326(Z) 124(P) 80(D)  Creatinine 0.44 - 1.00 mg/dL 9.83(J) 8.25(K) 5.39(J)  Sodium 135 - 145 mmol/L 143 142 140  Potassium 3.5 - 5.1 mmol/L 5.6(H) 5.0 4.7  Chloride 98 - 111 mmol/L 103 100 108  CO2 22 - 32 mmol/L 35(H) 31 20(L)  Calcium 8.9 - 10.3 mg/dL 8.0(L) 7.3(L) 6.8(L)  Total Protein 6.5 - 8.1 g/dL 4.8(L) 4.8(L) 4.4(L)  Total Bilirubin 0.3 - 1.2 mg/dL 0.5 0.6 0.6  Alkaline Phos 38 - 126 U/L 121 129(H) 98  AST 15 - 41 U/L 112(H) 297(H) 750(H)  ALT 0 - 44 U/L 544(H) 849(H) 1,084(H)    CBC Latest Ref Rng & Units 06/21/2020 06/20/2020 06/19/2020  WBC 4.0 - 10.5 K/uL 6.3 6.1 12.6(H)  Hemoglobin 12.0 - 15.0 g/dL 8.1(L) 8.6(L) 8.6(L)  Hematocrit 36.0 - 46.0 % 25.6(L) 28.0(L) 28.4(L)  Platelets 150 - 400 K/uL 136(L) 144(L) 167    ABG    Component Value Date/Time   PHART 7.290 (L) 06/20/2020 0558   PCO2ART 61.0 (H) 06/20/2020 0558   PO2ART 70.9 (L) 06/20/2020 0558   HCO3 28.1 (H) 06/20/2020 0558   TCO2 24 06/18/2020 1803   ACIDBASEDEF 2.9 (H) 06/19/2020 0332   O2SAT 92.9 06/20/2020 0558    CBG (last 3)  Recent Labs    06/20/20 2250 06/21/20 0334 06/21/20 0743  GLUCAP 247* 150* 167*    Critical care time: 31 minutes   The patient is critically ill and requires high complexity decision making for assessment and support, frequent evaluation and titration of therapies, application of advanced monitoring technologies and extensive interpretation of multiple databases.    Mechele Collin, M.D. [facility] Pulmonary/Critical Care Medicine 06/21/2020 8:21 AM   Please see Amion for pager number to reach on-call Pulmonary and  Critical Care Team.

## 2020-06-21 NOTE — Progress Notes (Signed)
Nutrition Follow-up  DOCUMENTATION CODES:   Not applicable  INTERVENTION:   Continue Tube Feeding via OG:  Vital 1.5 at 60 ml/hr Pro-Source TF 45 mL daily Provides 2200 kcals, 108 g of protein, 1094 mL of free water   NUTRITION DIAGNOSIS:   Increased nutrient needs related to acute illness (COVID-19 pneumonia) as evidenced by estimated needs.  Being addressed via TF   GOAL:   Provide needs based on ASPEN/SCCM guidelines  Progressing  MONITOR:   Vent status,TF tolerance,Labs,Weight trends  REASON FOR ASSESSMENT:   LOS    ASSESSMENT:   Patient transferred to ICU for heated high flow with BiPAP. She  presented with acute hypoxic respiratory failure due to COVID-19 pneumonia. Complaint of decreased appetite and general weakness prior to admission.  2/18 Admitted to [facility]  3/05 Intubated, transferred to [facility] 3/06 L pneumothorax with pigtail cath placed  Pt remains sedated on vent support  Vital 1.5 at 60 ml/hr with Pro-Source TF 45 mL daily via Cortrak  Constipation resolving, +large BM yesterday  Worsening AKI, BUN>100, mild hyperkalemia. UOP 750 mL in 24 hours  Net +13 L; diuresing today. Weight up to 80 kg; lowest wt 69.7 kg. Noted mild edema present on exam per nsg documentation  No skin breakdown per RN assessment  Labs: BUN 136, Creatinine 3.11, potassium 5.6 (H), sodium 143 (wdl) Meds: colace, ss novolog, lantus, miralax  Diet Order:   Diet Order            Diet NPO time specified  Diet effective now                 EDUCATION NEEDS:   Not appropriate for education at this time  Skin:  Skin Assessment: Reviewed RN Assessment  Last BM:  3/10  Height:   Ht Readings from Last 1 Encounters:  06/15/20 5\' 4"  (1.626 m)    Weight:   Wt Readings from Last 1 Encounters:  06/19/20 80.1 kg    BMI:  Body mass index is 30.31 kg/m.  Estimated Nutritional Needs:   Kcal:  2100-2400 kcals  Protein:  105-120 g  Fluid:  >2000 ml daily   08/19/20 MS, RDN, LDN, CNSC Registered Dietitian III Clinical Nutrition RD Pager and On-Call Pager Number Located in [place]

## 2020-06-22 ENCOUNTER — Inpatient Hospital Stay (HOSPITAL_COMMUNITY): Payer: BLUE CROSS/BLUE SHIELD

## 2020-06-22 DIAGNOSIS — U071 COVID-19: Secondary | ICD-10-CM | POA: Diagnosis not present

## 2020-06-22 DIAGNOSIS — J1282 Pneumonia due to coronavirus disease 2019: Secondary | ICD-10-CM | POA: Diagnosis not present

## 2020-06-22 LAB — POCT I-STAT 7, (LYTES, BLD GAS, ICA,H+H)
Acid-Base Excess: 10 mmol/L — ABNORMAL HIGH (ref 0.0–2.0)
Acid-Base Excess: 10 mmol/L — ABNORMAL HIGH (ref 0.0–2.0)
Bicarbonate: 36.3 mmol/L — ABNORMAL HIGH (ref 20.0–28.0)
Bicarbonate: 35.6 mmol/L — ABNORMAL HIGH (ref 20.0–28.0)
Calcium, Ion: 1.22 mmol/L (ref 1.15–1.40)
Calcium, Ion: 1.23 mmol/L (ref 1.15–1.40)
HCT: 23 % — ABNORMAL LOW (ref 36.0–46.0)
HCT: 20 % — ABNORMAL LOW (ref 36.0–46.0)
Hemoglobin: 7.8 g/dL — ABNORMAL LOW (ref 12.0–15.0)
Hemoglobin: 6.8 g/dL — CL (ref 12.0–15.0)
O2 Saturation: 91 %
O2 Saturation: 95 %
Patient temperature: 99.4
Patient temperature: 98.9
Potassium: 5.7 mmol/L — ABNORMAL HIGH (ref 3.5–5.1)
Potassium: 5.5 mmol/L — ABNORMAL HIGH (ref 3.5–5.1)
Sodium: 144 mmol/L (ref 135–145)
Sodium: 144 mmol/L (ref 135–145)
TCO2: 38 mmol/L — ABNORMAL HIGH (ref 22–32)
TCO2: 37 mmol/L — ABNORMAL HIGH (ref 22–32)
pCO2 arterial: 62.4 mmHg — ABNORMAL HIGH (ref 32.0–48.0)
pCO2 arterial: 56 mmHg — ABNORMAL HIGH (ref 32.0–48.0)
pH, Arterial: 7.375 (ref 7.350–7.450)
pH, Arterial: 7.412 (ref 7.350–7.450)
pO2, Arterial: 66 mmHg — ABNORMAL LOW (ref 83.0–108.0)
pO2, Arterial: 78 mmHg — ABNORMAL LOW (ref 83.0–108.0)

## 2020-06-22 LAB — BASIC METABOLIC PANEL
Anion gap: 7 (ref 5–15)
Anion gap: 10 (ref 5–15)
BUN: 140 mg/dL — ABNORMAL HIGH (ref 8–23)
BUN: 143 mg/dL — ABNORMAL HIGH (ref 8–23)
CO2: 35 mmol/L — ABNORMAL HIGH (ref 22–32)
CO2: 31 mmol/L (ref 22–32)
Calcium: 8.7 mg/dL — ABNORMAL LOW (ref 8.9–10.3)
Calcium: 8.3 mg/dL — ABNORMAL LOW (ref 8.9–10.3)
Chloride: 104 mmol/L (ref 98–111)
Chloride: 100 mmol/L (ref 98–111)
Creatinine, Ser: 3.02 mg/dL — ABNORMAL HIGH (ref 0.44–1.00)
Creatinine, Ser: 3.03 mg/dL — ABNORMAL HIGH (ref 0.44–1.00)
GFR, Estimated: 17 mL/min — ABNORMAL LOW (ref >60)
GFR, Estimated: 17 mL/min — ABNORMAL LOW (ref >60)
Glucose, Bld: 150 mg/dL — ABNORMAL HIGH (ref 70–99)
Glucose, Bld: 260 mg/dL — ABNORMAL HIGH (ref 70–99)
Potassium: 6.1 mmol/L — ABNORMAL HIGH (ref 3.5–5.1)
Potassium: 5.1 mmol/L (ref 3.5–5.1)
Sodium: 146 mmol/L — ABNORMAL HIGH (ref 135–145)
Sodium: 141 mmol/L (ref 135–145)

## 2020-06-22 LAB — GLUCOSE, CAPILLARY
Glucose-Capillary: 129 mg/dL — ABNORMAL HIGH (ref 70–99)
Glucose-Capillary: 150 mg/dL — ABNORMAL HIGH (ref 70–99)
Glucose-Capillary: 166 mg/dL — ABNORMAL HIGH (ref 70–99)
Glucose-Capillary: 172 mg/dL — ABNORMAL HIGH (ref 70–99)
Glucose-Capillary: 173 mg/dL — ABNORMAL HIGH (ref 70–99)
Glucose-Capillary: 197 mg/dL — ABNORMAL HIGH (ref 70–99)
Glucose-Capillary: 224 mg/dL — ABNORMAL HIGH (ref 70–99)

## 2020-06-22 LAB — CBC
HCT: 27.2 % — ABNORMAL LOW (ref 36.0–46.0)
Hemoglobin: 8.2 g/dL — ABNORMAL LOW (ref 12.0–15.0)
MCH: 29 pg (ref 26.0–34.0)
MCHC: 30.1 g/dL (ref 30.0–36.0)
MCV: 96.1 fL (ref 80.0–100.0)
Platelets: 140 10*3/uL — ABNORMAL LOW (ref 150–400)
RBC: 2.83 MIL/uL — ABNORMAL LOW (ref 3.87–5.11)
RDW: 16.9 % — ABNORMAL HIGH (ref 11.5–15.5)
WBC: 7.8 10*3/uL (ref 4.0–10.5)
nRBC: 1 % — ABNORMAL HIGH (ref 0.0–0.2)

## 2020-06-22 LAB — HEMOGLOBIN AND HEMATOCRIT, BLOOD
HCT: 25.6 % — ABNORMAL LOW (ref 36.0–46.0)
Hemoglobin: 7.7 g/dL — ABNORMAL LOW (ref 12.0–15.0)

## 2020-06-22 LAB — POTASSIUM: Potassium: 5.6 mmol/L — ABNORMAL HIGH (ref 3.5–5.1)

## 2020-06-22 MED ORDER — SODIUM ZIRCONIUM CYCLOSILICATE 10 G PO PACK
10.0000 g | PACK | Freq: Once | ORAL | Status: AC
  Administered 2020-06-22: 10 g via ORAL
  Filled 2020-06-22: qty 1

## 2020-06-22 MED ORDER — LIP MEDEX EX OINT
TOPICAL_OINTMENT | CUTANEOUS | Status: DC | PRN
  Filled 2020-06-22 (×4): qty 7

## 2020-06-22 MED ORDER — FUROSEMIDE 10 MG/ML IJ SOLN
40.0000 mg | Freq: Once | INTRAMUSCULAR | Status: AC
  Administered 2020-06-22: 40 mg via INTRAVENOUS
  Filled 2020-06-22: qty 4

## 2020-06-22 NOTE — Progress Notes (Signed)
NAMEHazeline [name], MRN:  [id number], DOB:  [birth year], LOS: 22 ADMISSION DATE:  05/31/2020, CONSULTATION DATE: 06/15/2020 REFERRING MD: [facility], CHIEF COMPLAINT: Acute respiratory failure secondary to Covid with subsequent possible acquired pneumonia  Brief History:  65 yo female former smoker presented to [facility] on 05/31/20 with one week of progressive dyspnea from upper respiratory tract infection.  Found to have severe hypoxia with SpO2 50%.  She hadn't received COVID 19 vaccination.  She was treated for COVID 19 pneumonia with steroids, remdesivir and tocilizumab on 2/19.  She was started on ABx for possible HCAP.  She ultimately required intubation and transferred to [facility].  Pneumococcal urine antigen positive.  Past Medical History:  GERD, HLD, Nephrolithiasis  Significant Hospital Events:  2/18 Admit to [facility], start steroids and remdesivir, treated with tocilizumab 3/03 increasing O2 requirements, started on Bipap 3/05 intubated, transferred to [facility] 3/06 Lt pneumothorax >> pig tail catheter placed 3/07 hypoxia/hypotension/vent dys-synchrony >> improved after chest tube flushed 3/8 d/c steroids, start HCO3 gtt 3/10 d/c HCO3 gtt, off pressors   Consults:    Procedures:  ETT 3/05 >> Rt radial a line 3/06 >> Lt IJ CVL 3/06 >> Lt pig tail catheter 3/06 >>  Significant Diagnostic Tests:   CT chest 2/18 >> patchy b/l ASD  Doppler legs b/l 2/21 >> no DVT, 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 Left chest tube with <100 output. UOP fair with diuresis  Objective   Blood pressure (!) 130/58, pulse (!) 102, temperature 99.4 F (37.4 C), temperature source Oral, resp. rate (!) 32,  height 5\' 4"  (1.626 m), weight 80.1 kg, SpO2 94 %.    Vent Mode: PRVC FiO2 (%):  [70 %-80 %] 70 % Set Rate:  [32 bmp] 32 bmp Vt Set:  [380 mL-430 mL] 430 mL PEEP:  [8 cmH20-10 cmH20] 10 cmH20 Plateau Pressure:  [24 cmH20-34 cmH20] 34 cmH20   Intake/Output Summary (Last 24 hours) at 06/22/2020 1117 Last data filed at 06/22/2020 1100 Gross per 24 hour  Intake 2106.74 ml  Output 1820 ml  Net 286.74 ml   Filed Weights   06/17/20 0500 06/18/20 0456 06/19/20 0258  Weight: 70.2 kg 79.2 kg 80.1 kg   Physical Exam: General: Chronically ill-appearing, no acute distress HENT: [place], AT, ETT in place Eyes: EOMI, no scleral icterus Respiratory: Vented breath sounds bilaterally.  No crackles, wheezing or rales. Left chest tube with air leak present Cardiovascular: RRR, -M/R/G, no JVD GI: BS+, soft, nontender Extremities:Anasarca, -Edema,-tenderness Neuro: RASS -4 GU: Foley in place  Resolved Hospital Problem list   Non gap metabolic acidosis Elevated LFTs Assessment & Plan:   Acute hypoxic/hypercapnic respiratory failure from COVID 19 pneumonia with ARDS complicated by Pneumococcal bacterial pneumonia, left pneumothorax - ABG reviewed. Acute hypoxemic and hypercarbic respiratory failure - Increase VT 8 cc/kg, plateau pressure < 30, driving force < 15 - goal SpO2 88 to 95% - day 7/7 of ABx, currently on rocephin - Diureses - RASS goal -1: Wean Versed and Fentanyl gtt  Lt pneumothorax. - down to +1 air leak on 3/10 - continue pig tail to -20 cm suction - f/u CXR - flush catheter every shift  Acute metabolic encephalopathy  2nd to hypoxia/hypercapnia. - RASS goal -1 - Wean versed, fentanyl gtt   AKI from ATN 2nd to hypoxia, hypotension - worsening Mild hyperkalemia - baseline creatinine 0.79 from 05/31/20 - f/u K in pm - Diurese  Anemia of critical illness. Thrombocytopenia. - f/u CBC - transfuse for Hb < 7 or significant bleeding  Steroid induced hyperglycemia. - SSI  Hx  of HLD. - continue simvastatin  Constipation. - continue bowel regimen  Best practice (evaluated daily)  Diet: tube feeds DVT prophylaxis: lovenox GI prophylaxis: protonix Mobility: Bedrest Disposition: ICU  Code Status: Full  Labs    CMP Latest Ref Rng & Units 06/22/2020 06/22/2020 06/21/2020  Glucose 70 - 99 mg/dL - 102(H) 852(D)  BUN 8 - 23 mg/dL - 782(U) 235(T)  Creatinine 0.44 - 1.00 mg/dL - 6.14(E) 3.15(Q)  Sodium 135 - 145 mmol/L 144 146(H) 143  Potassium 3.5 - 5.1 mmol/L 5.7(H) 6.1(H) 5.9(H)  Chloride 98 - 111 mmol/L - 104 101  CO2 22 - 32 mmol/L - 35(H) 33(H)  Calcium 8.9 - 10.3 mg/dL - 8.7(L) 8.2(L)  Total Protein 6.5 - 8.1 g/dL - - -  Total Bilirubin 0.3 - 1.2 mg/dL - - -  Alkaline Phos 38 - 126 U/L - - -  AST 15 - 41 U/L - - -  ALT 0 - 44 U/L - - -    CBC Latest Ref Rng & Units 06/22/2020 06/22/2020 06/21/2020  WBC 4.0 - 10.5 K/uL - 7.8 6.3  Hemoglobin 12.0 - 15.0 g/dL 7.8(L) 8.2(L) 8.1(L)  Hematocrit 36.0 - 46.0 % 23.0(L) 27.2(L) 25.6(L)  Platelets 150 - 400 K/uL - 140(L) 136(L)    ABG    Component Value Date/Time   PHART 7.375 06/22/2020 1009   PCO2ART 62.4 (H) 06/22/2020 1009   PO2ART 66 (L) 06/22/2020 1009   HCO3 36.3 (H) 06/22/2020 1009   TCO2 38 (H) 06/22/2020 1009   ACIDBASEDEF 2.9 (H) 06/19/2020 0332   O2SAT 91.0 06/22/2020 1009    CBG (last 3)  Recent Labs    06/22/20 0007 06/22/20 0333 06/22/20 0749  GLUCAP 173* 166* 129*    Critical care time: 40 minutes   The patient is critically ill with multiple organ systems failure and requires high complexity decision making for assessment and support, frequent evaluation and titration of therapies, application of advanced monitoring technologies and extensive interpretation of multiple databases.  Independent Critical Care Time: 40 Minutes.   Mechele Collin, M.D. [facility] Pulmonary/Critical Care Medicine 06/22/2020 11:17 AM   Please see Amion for pager number to reach on-call Pulmonary and  Critical Care Team.

## 2020-06-23 DIAGNOSIS — J1282 Pneumonia due to coronavirus disease 2019: Secondary | ICD-10-CM | POA: Diagnosis not present

## 2020-06-23 DIAGNOSIS — U071 COVID-19: Secondary | ICD-10-CM | POA: Diagnosis not present

## 2020-06-23 LAB — CBC
HCT: 26.7 % — ABNORMAL LOW (ref 36.0–46.0)
HCT: 26.3 % — ABNORMAL LOW (ref 36.0–46.0)
Hemoglobin: 8.3 g/dL — ABNORMAL LOW (ref 12.0–15.0)
Hemoglobin: 8.1 g/dL — ABNORMAL LOW (ref 12.0–15.0)
MCH: 29.4 pg (ref 26.0–34.0)
MCH: 29.3 pg (ref 26.0–34.0)
MCHC: 31.1 g/dL (ref 30.0–36.0)
MCHC: 30.8 g/dL (ref 30.0–36.0)
MCV: 94.7 fL (ref 80.0–100.0)
MCV: 95.3 fL (ref 80.0–100.0)
Platelets: 152 10*3/uL (ref 150–400)
Platelets: 162 10*3/uL (ref 150–400)
RBC: 2.82 MIL/uL — ABNORMAL LOW (ref 3.87–5.11)
RBC: 2.76 MIL/uL — ABNORMAL LOW (ref 3.87–5.11)
RDW: 17.2 % — ABNORMAL HIGH (ref 11.5–15.5)
RDW: 17.2 % — ABNORMAL HIGH (ref 11.5–15.5)
WBC: 8.3 10*3/uL (ref 4.0–10.5)
WBC: 9.3 10*3/uL (ref 4.0–10.5)
nRBC: 0.4 % — ABNORMAL HIGH (ref 0.0–0.2)
nRBC: 0.3 % — ABNORMAL HIGH (ref 0.0–0.2)

## 2020-06-23 LAB — COMPREHENSIVE METABOLIC PANEL
ALT: 237 U/L — ABNORMAL HIGH (ref 0–44)
AST: 56 U/L — ABNORMAL HIGH (ref 15–41)
Albumin: 1.4 g/dL — ABNORMAL LOW (ref 3.5–5.0)
Alkaline Phosphatase: 106 U/L (ref 38–126)
Anion gap: 9 (ref 5–15)
BUN: 141 mg/dL — ABNORMAL HIGH (ref 8–23)
CO2: 34 mmol/L — ABNORMAL HIGH (ref 22–32)
Calcium: 8.8 mg/dL — ABNORMAL LOW (ref 8.9–10.3)
Chloride: 103 mmol/L (ref 98–111)
Creatinine, Ser: 2.93 mg/dL — ABNORMAL HIGH (ref 0.44–1.00)
GFR, Estimated: 17 mL/min — ABNORMAL LOW (ref >60)
Glucose, Bld: 248 mg/dL — ABNORMAL HIGH (ref 70–99)
Potassium: 5.2 mmol/L — ABNORMAL HIGH (ref 3.5–5.1)
Sodium: 146 mmol/L — ABNORMAL HIGH (ref 135–145)
Total Bilirubin: 0.7 mg/dL (ref 0.3–1.2)
Total Protein: 5.5 g/dL — ABNORMAL LOW (ref 6.5–8.1)

## 2020-06-23 LAB — BASIC METABOLIC PANEL
Anion gap: 11 (ref 5–15)
BUN: 142 mg/dL — ABNORMAL HIGH (ref 8–23)
CO2: 35 mmol/L — ABNORMAL HIGH (ref 22–32)
Calcium: 8.6 mg/dL — ABNORMAL LOW (ref 8.9–10.3)
Chloride: 102 mmol/L (ref 98–111)
Creatinine, Ser: 2.94 mg/dL — ABNORMAL HIGH (ref 0.44–1.00)
GFR, Estimated: 17 mL/min — ABNORMAL LOW (ref >60)
Glucose, Bld: 203 mg/dL — ABNORMAL HIGH (ref 70–99)
Potassium: 4.9 mmol/L (ref 3.5–5.1)
Sodium: 148 mmol/L — ABNORMAL HIGH (ref 135–145)

## 2020-06-23 LAB — GLUCOSE, CAPILLARY
Glucose-Capillary: 177 mg/dL — ABNORMAL HIGH (ref 70–99)
Glucose-Capillary: 209 mg/dL — ABNORMAL HIGH (ref 70–99)
Glucose-Capillary: 172 mg/dL — ABNORMAL HIGH (ref 70–99)
Glucose-Capillary: 167 mg/dL — ABNORMAL HIGH (ref 70–99)
Glucose-Capillary: 163 mg/dL — ABNORMAL HIGH (ref 70–99)
Glucose-Capillary: 172 mg/dL — ABNORMAL HIGH (ref 70–99)
Glucose-Capillary: 172 mg/dL — ABNORMAL HIGH (ref 70–99)

## 2020-06-23 MED ORDER — NOREPINEPHRINE 4 MG/250ML-% IV SOLN
INTRAVENOUS | Status: AC
  Filled 2020-06-23: qty 250

## 2020-06-23 MED ORDER — INSULIN ASPART 100 UNIT/ML ~~LOC~~ SOLN
0.0000 [IU] | SUBCUTANEOUS | Status: DC
  Administered 2020-06-23 – 2020-06-24 (×5): 4 [IU] via SUBCUTANEOUS
  Administered 2020-06-24: 7 [IU] via SUBCUTANEOUS
  Administered 2020-06-24 – 2020-06-26 (×10): 4 [IU] via SUBCUTANEOUS
  Administered 2020-06-26: 3 [IU] via SUBCUTANEOUS
  Administered 2020-06-27: 4 [IU] via SUBCUTANEOUS
  Administered 2020-06-27 (×3): 3 [IU] via SUBCUTANEOUS
  Administered 2020-06-27 (×2): 4 [IU] via SUBCUTANEOUS
  Administered 2020-06-28: 3 [IU] via SUBCUTANEOUS
  Administered 2020-06-28 (×4): 4 [IU] via SUBCUTANEOUS
  Administered 2020-06-28 – 2020-06-29 (×2): 3 [IU] via SUBCUTANEOUS
  Administered 2020-06-29 (×2): 4 [IU] via SUBCUTANEOUS
  Administered 2020-06-29: 3 [IU] via SUBCUTANEOUS
  Administered 2020-06-29: 7 [IU] via SUBCUTANEOUS
  Administered 2020-06-30: 3 [IU] via SUBCUTANEOUS
  Administered 2020-06-30: 4 [IU] via SUBCUTANEOUS
  Administered 2020-06-30: 3 [IU] via SUBCUTANEOUS
  Administered 2020-06-30 – 2020-07-01 (×2): 4 [IU] via SUBCUTANEOUS
  Administered 2020-07-01 (×2): 3 [IU] via SUBCUTANEOUS
  Administered 2020-07-02: 4 [IU] via SUBCUTANEOUS
  Administered 2020-07-02: 3 [IU] via SUBCUTANEOUS
  Administered 2020-07-03: 4 [IU] via SUBCUTANEOUS
  Administered 2020-07-04: 3 [IU] via SUBCUTANEOUS
  Administered 2020-07-04: 4 [IU] via SUBCUTANEOUS
  Administered 2020-07-04: 3 [IU] via SUBCUTANEOUS
  Administered 2020-07-04: 7 [IU] via SUBCUTANEOUS
  Administered 2020-07-04 (×2): 4 [IU] via SUBCUTANEOUS
  Administered 2020-07-05: 3 [IU] via SUBCUTANEOUS
  Administered 2020-07-05 (×4): 4 [IU] via SUBCUTANEOUS
  Administered 2020-07-05: 7 [IU] via SUBCUTANEOUS
  Administered 2020-07-06: 3 [IU] via SUBCUTANEOUS
  Administered 2020-07-06 – 2020-07-07 (×8): 4 [IU] via SUBCUTANEOUS
  Administered 2020-07-07: 3 [IU] via SUBCUTANEOUS
  Administered 2020-07-07 – 2020-07-08 (×4): 4 [IU] via SUBCUTANEOUS
  Administered 2020-07-08 (×2): 3 [IU] via SUBCUTANEOUS
  Administered 2020-07-08 – 2020-07-09 (×2): 4 [IU] via SUBCUTANEOUS
  Administered 2020-07-09: 3 [IU] via SUBCUTANEOUS
  Administered 2020-07-09: 4 [IU] via SUBCUTANEOUS
  Administered 2020-07-09 (×2): 3 [IU] via SUBCUTANEOUS

## 2020-06-23 MED ORDER — NOREPINEPHRINE 4 MG/250ML-% IV SOLN
0.0000 ug/min | INTRAVENOUS | Status: DC
  Administered 2020-06-23: 10 ug/min via INTRAVENOUS
  Administered 2020-06-25: 2 ug/min via INTRAVENOUS
  Filled 2020-06-23: qty 250

## 2020-06-23 MED ORDER — FUROSEMIDE 10 MG/ML IJ SOLN
40.0000 mg | Freq: Four times a day (QID) | INTRAMUSCULAR | Status: DC
  Administered 2020-06-23 (×2): 40 mg via INTRAVENOUS
  Filled 2020-06-23 (×2): qty 4

## 2020-06-23 NOTE — Progress Notes (Signed)
RT removed a-line dressing and adjusted catheter to see if it would change blood pressure readings. Readings still the same afterwards. RN notified.

## 2020-06-23 NOTE — Progress Notes (Signed)
eLink Physician-Brief Progress Note Patient Name: Rachel [name] DOB: 1955/07/09 MRN: [id number]   Date of Service  06/23/2020  HPI/Events of Note  RN notes right foot is somewhat more red and warm than the left foot. On my (limited) camera evaluation, I am unable to appreciate the difference. Pulses are 2+ bilaterally. There is no difference in edema between the two limbs. There is no apparent wound on either leg.   eICU Interventions  Reassuringly, pulses are present bilaterally so this is not a vascular issue. I cannot rule out a cellulitis, but this would be very odd in a hospitalized patient in whom there is no wound. I have asked the RN to show her findings to the incoming (day) RN and then ask her to re-evaluate later today to see if it has resolved or otherwise changed.     Intervention Category Intermediate Interventions: Other:  Janae Bridgeman 06/23/2020, 5:06 AM

## 2020-06-23 NOTE — Progress Notes (Addendum)
eLink Physician-Brief Progress Note Patient Name: Rachel [name] DOB: [birth year] MRN: [id number]   Date of Service  06/23/2020  HPI/Events of Note  RN requests EKG order to evaluate appearance of STE on telemetry.   eICU Interventions  EKG order entered.   ADDENDUM: - EKG reviewed by me and shows RBBB which limits interpretation. - Nevertheless, EKG appears grossly unchanged from prior EKG from 06/01/2020. - Given absence of any other findings to suggest ischemia, no further action at this time.  Intervention Category Intermediate Interventions: Arrhythmia - evaluation and management  Marveen Reeks Aroura Vasudevan 06/23/2020, 1:46 AM

## 2020-06-23 NOTE — Progress Notes (Signed)
NAMENaylea [name], MRN:  [id number], DOB:  December 26, 1955, LOS: 23 ADMISSION DATE:  05/31/2020, CONSULTATION DATE: 06/15/2020 REFERRING MD: [facility], CHIEF COMPLAINT: Acute respiratory failure secondary to Covid with subsequent possible acquired pneumonia  Brief History:  65 yo female former smoker presented to [facility] on 05/31/20 with one week of progressive dyspnea from upper respiratory tract infection.  Found to have severe hypoxia with SpO2 50%.  She hadn't received COVID 19 vaccination.  She was treated for COVID 19 pneumonia with steroids, remdesivir and tocilizumab on 2/19.  She was started on ABx for possible HCAP.  She ultimately required intubation and transferred to [facility].  Pneumococcal urine antigen positive.  Past Medical History:  GERD, HLD, Nephrolithiasis  Significant Hospital Events:  2/18 Admit to [facility], start steroids and remdesivir, treated with tocilizumab 3/03 increasing O2 requirements, started on Bipap 3/05 intubated, transferred to [facility] 3/06 Lt pneumothorax >> pig tail catheter placed 3/07 hypoxia/hypotension/vent dys-synchrony >> improved after chest tube flushed 3/8 d/c steroids, start HCO3 gtt 3/10 d/c HCO3 gtt, off pressors 3/11-3/13 Diuresis  Consults:    Procedures:  ETT 3/05 >> Rt radial a line 3/06 >> Lt IJ CVL 3/06 >> Lt pig tail catheter 3/06 >>  Significant Diagnostic Tests:   CT chest 2/18 >> patchy b/l ASD  Doppler legs b/l 2/21 >> no DVT, 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 Improving FIO2. Febrile this morning. No evidence of infection.   Objective   Blood pressure (!) 130/51, pulse 95, temperature 99.5 F (37.5 C), resp. rate (!) 32, height 5\' 4"  (1.626 m), weight 84.5 kg,  SpO2 100 %.    Vent Mode: PRVC FiO2 (%):  [50 %-70 %] 50 % Set Rate:  [32 bmp] 32 bmp Vt Set:  [430 mL] 430 mL PEEP:  [10 cmH20] 10 cmH20 Plateau Pressure:  [27 cmH20-35 cmH20] 27 cmH20   Intake/Output Summary (Last 24 hours) at 06/23/2020 0823 Last data filed at 06/23/2020 0700 Gross per 24 hour  Intake 2812.55 ml  Output 2755 ml  Net 57.55 ml   Filed Weights   06/18/20 0456 06/19/20 0258 06/23/20 0400  Weight: 79.2 kg 80.1 kg 84.5 kg   Physical Exam: General: Chronically illappearing, no acute distress HENT: [place], AT, ETT in place Eyes: EOMI, no scleral icterus Respiratory: Diminished breath sounds bilaterally.  No crackles, wheezing or rales. Left chest tube in place Cardiovascular: RRR, -M/R/G, no JVD GI: BS+, soft, nontender Extremities:1+edema in upper extremities,-tenderness Neuro: Sedated GU: Foley in place   Resolved Hospital Problem list   Non gap metabolic acidosis Elevated LFTs Assessment & Plan:   Acute hypoxic/hypercapnic respiratory failure from COVID 19 pneumonia with ARDS complicated by Pneumococcal bacterial pneumonia, left pneumothorax Fever on 3/13 - clinically improving. Recently completed antibiotics.  - Completed 7 day course of antibiotics. Monitor fever/WBC curve - VT 8 cc/kg, plateau pressure < 30, driving force < 15 - goal SpO2 88 to 95% - Aggressive diureses. Good UOP - RASS goal -1: Wean Versed and Fentanyl gtt  Lt pneumothorax. - No air leak seen today - continue pig tail to -20 cm suction - f/u CXR - flush catheter every shift  Acute metabolic encephalopathy 2nd to hypoxia/hypercapnia, uremic encepahalopathy -  RASS goal -1 - Wean versed, fentanyl gtt   AKI from ATN- improving with diuresis Mild hyperkalemia - improving - S/p Lokelma x 2 on 3/12 - baseline creatinine 0.79 from 05/31/20 - BMET this evening - Diurese  Anemia of critical illness. Thrombocytopenia. - f/u CBC - transfuse for Hb < 7 or significant  bleeding  Hyperglycemia. - SSI  Hx of HLD. - continue simvastatin  Constipation. - continue bowel regimen  Best practice (evaluated daily)  Diet: tube feeds DVT prophylaxis: lovenox GI prophylaxis: protonix Mobility: Bedrest Disposition: ICU  Code Status: Full  Labs    CMP Latest Ref Rng & Units 06/23/2020 06/22/2020 06/22/2020  Glucose 70 - 99 mg/dL 841(Y) 606(T) -  BUN 8 - 23 mg/dL 016(W) 109(N) -  Creatinine 0.44 - 1.00 mg/dL 2.35(T) 7.32(K) -  Sodium 135 - 145 mmol/L 146(H) 141 -  Potassium 3.5 - 5.1 mmol/L 5.2(H) 5.1 5.6(H)  Chloride 98 - 111 mmol/L 103 100 -  CO2 22 - 32 mmol/L 34(H) 31 -  Calcium 8.9 - 10.3 mg/dL 0.2(R) 8.3(L) -  Total Protein 6.5 - 8.1 g/dL 4.2(H) - -  Total Bilirubin 0.3 - 1.2 mg/dL 0.7 - -  Alkaline Phos 38 - 126 U/L 106 - -  AST 15 - 41 U/L 56(H) - -  ALT 0 - 44 U/L 237(H) - -    CBC Latest Ref Rng & Units 06/23/2020 06/22/2020 06/22/2020  WBC 4.0 - 10.5 K/uL 8.3 - -  Hemoglobin 12.0 - 15.0 g/dL 8.3(L) 7.7(L) 6.8(LL)  Hematocrit 36.0 - 46.0 % 26.7(L) 25.6(L) 20.0(L)  Platelets 150 - 400 K/uL 152 - -    ABG    Component Value Date/Time   PHART 7.412 06/22/2020 1207   PCO2ART 56.0 (H) 06/22/2020 1207   PO2ART 78 (L) 06/22/2020 1207   HCO3 35.6 (H) 06/22/2020 1207   TCO2 37 (H) 06/22/2020 1207   ACIDBASEDEF 2.9 (H) 06/19/2020 0332   O2SAT 95.0 06/22/2020 1207    CBG (last 3)  Recent Labs    06/22/20 2328 06/23/20 0341 06/23/20 0755  GLUCAP 172* 177* 209*    Critical care time: 35 minutes   The patient is critically ill with multiple organ systems failure and requires high complexity decision making for assessment and support, frequent evaluation and titration of therapies, application of advanced monitoring technologies and extensive interpretation of multiple databases.  Independent Critical Care Time: 35 Minutes.   Mechele Collin, M.D. [facility] Pulmonary/Critical Care Medicine 06/23/2020 8:23 AM   Please see Amion for pager  number to reach on-call Pulmonary and Critical Care Team.

## 2020-06-23 NOTE — Progress Notes (Signed)
Patient noted to be hypotensive with MAP around 60. Versed drip decreased to 1 mg/hr and Fentanyl drip decreased to 100 mcg/hr. MD notified, instructed nurse to monitor for now following decrease in sedation and follow up. 06/23/20 1730 pm.  Patient with persistent, worsening hypotension with MAP as low as 52. MD notified. Orders placed to discontinue Furosemide and initiate Levophed infusion. 06/23/20 1815 pm.

## 2020-06-23 NOTE — Progress Notes (Signed)
Per MD, maintain MAP below 110, utilize cuff pressures as arterial line appears underdampened despite multiple manipulations, flushing of line, zeroing arterial line. RT notified to also manipulate arterial line.

## 2020-06-23 NOTE — Progress Notes (Signed)
Arterial line waveform has improved and pressures correlate.  Upon assessment, left foot cold. Right foot red and hot in temperature.  DP and PT pulses intact in each. Verified with doppler. Elink notified.

## 2020-06-23 NOTE — Progress Notes (Signed)
MD notified- Systolic BP above 170s with arterial line. Cuff pressures 120s.

## 2020-06-24 ENCOUNTER — Inpatient Hospital Stay (HOSPITAL_COMMUNITY): Payer: BLUE CROSS/BLUE SHIELD

## 2020-06-24 DIAGNOSIS — J1282 Pneumonia due to coronavirus disease 2019: Secondary | ICD-10-CM | POA: Diagnosis not present

## 2020-06-24 DIAGNOSIS — U071 COVID-19: Secondary | ICD-10-CM | POA: Diagnosis not present

## 2020-06-24 DIAGNOSIS — J9601 Acute respiratory failure with hypoxia: Secondary | ICD-10-CM | POA: Diagnosis not present

## 2020-06-24 LAB — POCT I-STAT 7, (LYTES, BLD GAS, ICA,H+H)
Acid-Base Excess: 15 mmol/L — ABNORMAL HIGH (ref 0.0–2.0)
Bicarbonate: 40.3 mmol/L — ABNORMAL HIGH (ref 20.0–28.0)
Calcium, Ion: 1.18 mmol/L (ref 1.15–1.40)
HCT: 22 % — ABNORMAL LOW (ref 36.0–46.0)
Hemoglobin: 7.5 g/dL — ABNORMAL LOW (ref 12.0–15.0)
O2 Saturation: 93 %
Patient temperature: 38.2
Potassium: 4.6 mmol/L (ref 3.5–5.1)
Sodium: 149 mmol/L — ABNORMAL HIGH (ref 135–145)
TCO2: 42 mmol/L — ABNORMAL HIGH (ref 22–32)
pCO2 arterial: 55.3 mmHg — ABNORMAL HIGH (ref 32.0–48.0)
pH, Arterial: 7.476 — ABNORMAL HIGH (ref 7.350–7.450)
pO2, Arterial: 69 mmHg — ABNORMAL LOW (ref 83.0–108.0)

## 2020-06-24 LAB — BASIC METABOLIC PANEL
Anion gap: 12 (ref 5–15)
BUN: 143 mg/dL — ABNORMAL HIGH (ref 8–23)
CO2: 35 mmol/L — ABNORMAL HIGH (ref 22–32)
Calcium: 8.8 mg/dL — ABNORMAL LOW (ref 8.9–10.3)
Chloride: 103 mmol/L (ref 98–111)
Creatinine, Ser: 2.76 mg/dL — ABNORMAL HIGH (ref 0.44–1.00)
GFR, Estimated: 19 mL/min — ABNORMAL LOW (ref >60)
Glucose, Bld: 178 mg/dL — ABNORMAL HIGH (ref 70–99)
Potassium: 5 mmol/L (ref 3.5–5.1)
Sodium: 150 mmol/L — ABNORMAL HIGH (ref 135–145)

## 2020-06-24 LAB — CBC
HCT: 30.9 % — ABNORMAL LOW (ref 36.0–46.0)
Hemoglobin: 9.4 g/dL — ABNORMAL LOW (ref 12.0–15.0)
MCH: 28.7 pg (ref 26.0–34.0)
MCHC: 30.4 g/dL (ref 30.0–36.0)
MCV: 94.5 fL (ref 80.0–100.0)
Platelets: 158 10*3/uL (ref 150–400)
RBC: 3.27 MIL/uL — ABNORMAL LOW (ref 3.87–5.11)
RDW: 17.1 % — ABNORMAL HIGH (ref 11.5–15.5)
WBC: 9 10*3/uL (ref 4.0–10.5)
nRBC: 0.2 % (ref 0.0–0.2)

## 2020-06-24 LAB — GLUCOSE, CAPILLARY
Glucose-Capillary: 162 mg/dL — ABNORMAL HIGH (ref 70–99)
Glucose-Capillary: 163 mg/dL — ABNORMAL HIGH (ref 70–99)
Glucose-Capillary: 169 mg/dL — ABNORMAL HIGH (ref 70–99)
Glucose-Capillary: 176 mg/dL — ABNORMAL HIGH (ref 70–99)
Glucose-Capillary: 191 mg/dL — ABNORMAL HIGH (ref 70–99)
Glucose-Capillary: 214 mg/dL — ABNORMAL HIGH (ref 70–99)

## 2020-06-24 NOTE — Progress Notes (Signed)
MD aware arterial line underdamped. RN to utilize MAP/cuff pressures.

## 2020-06-24 NOTE — Progress Notes (Addendum)
NAMEAjla [name], MRN:  [id number], DOB:  11/23/1955, LOS: 24 ADMISSION DATE:  05/31/2020, CONSULTATION DATE: 06/15/2020 REFERRING MD: [facility], CHIEF COMPLAINT: Acute respiratory failure secondary to Covid with subsequent possible acquired pneumonia   Brief History:  65 yo female former smoker presented to [facility] on 05/31/20 with one week of progressive dyspnea from upper respiratory tract infection.  Found to have severe hypoxia with SpO2 50%.  She hadn't received COVID 19 vaccination.  She was treated for COVID 19 pneumonia with steroids, remdesivir and tocilizumab on 2/19.  She was started on ABx for possible HCAP.  She ultimately required intubation and transferred to [facility].  Pneumococcal urine antigen positive.  Past Medical History:  GERD, HLD, Nephrolithiasis  Significant Hospital Events:  2/18 Admit to [facility], start steroids and remdesivir, treated with tocilizumab 3/03 increasing O2 requirements, started on Bipap 3/05 intubated, transferred to [facility] 3/06 Lt pneumothorax >> pig tail catheter placed 3/07 hypoxia/hypotension/vent dys-synchrony >> improved after chest tube flushed 3/8 d/c steroids, start HCO3 gtt 3/10 d/c HCO3 gtt, off pressors 3/11-3/13 Diuresis  Consults:    Procedures:  ETT 3/05 >> Rt radial a line 3/06 >> Lt IJ CVL 3/06 >> Lt pig tail catheter 3/06 >>  Significant Diagnostic Tests:   CT chest 2/18 >> patchy b/l ASD  Doppler legs b/l 2/21 >> no DVT, 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 Precedex 0.4 Increase in FiO2 needs >> currently 0.70, PEEP 10.  Her tidal volumes were liberalized 8  cc/kg over the weekend. Pplat 33 this morning Sodium 150, creatinine 2.94 >> 2.76 I/O+ 14 L total, urine output 2650 cc / 24 hours  Objective   Blood pressure 140/73, pulse (!) 105, temperature (!) 100.76 F (38.2 C), resp. rate (!) 30, height 5\' 4"  (1.626 m), weight 84.5 kg, SpO2 93 %.    Vent Mode: PRVC FiO2 (%):  [50 %-70 %] 60 % Set Rate:  [32 bmp] 32 bmp Vt Set:  [430 mL] 430 mL PEEP:  [10 cmH20] 10 cmH20 Plateau Pressure:  [31 cmH20-36 cmH20] 36 cmH20   Intake/Output Summary (Last 24 hours) at 06/24/2020 0954 Last data filed at 06/24/2020 0830 Gross per 24 hour  Intake 1400.85 ml  Output 2735 ml  Net -1334.15 ml   Filed Weights   06/18/20 0456 06/19/20 0258 06/23/20 0400  Weight: 79.2 kg 80.1 kg 84.5 kg   Physical Exam: General: Ill-appearing woman, intubated, poorly responsive HENT: ET tube in place, OG tube in place, oropharynx otherwise clear, large tongue somewhat dry Eyes: No icterus Respiratory: Decreased bilateral breath sounds, decreased at both bases without wheezing.  Chest tube in place left Cardiovascular: Regular, distant, no murmur GI: Obese, nondistended, positive bowel sounds Extremities: 2+ upper extremity edema, upper extremities are cool.  Trace ankle edema Neuro: Sedated, some grimace with stimulation, pain but does not open eyes, does not interact or follow commands   Resolved Hospital Problem list   Non gap metabolic acidosis Elevated LFTs  Assessment & Plan:   Acute hypoxic/hypercapnic respiratory failure from COVID 19 pneumonia with ARDS complicated by Pneumococcal bacterial pneumonia, left pneumothorax Fever on 3/13  -Continue to  hold off antibiotics, follow clinical status.  Completed 7 days on 3/12 -VT liberalized 8 cc/kg over the weekend, Pplat 33-34.  May need to consider going down to 7 cc/kg.  Will check ABG and adjust -Had tolerated diuresis but with hypotension 3/13 Lasix held.  Plan to dose daily depending on hemodynamics, renal  function -Follow chest x-ray -Sedation as per PAD protocol.  Has had some decreased oxygenation with liberalization of her RASS goals.  Plan to continue RASS -2 to -3 for now.  Versed and fentanyl infusions  Lt pneumothorax.  No air leak -Continue chest tube to suction for now, consider waterseal going forward but likely no removal until she is off positive pressure -Follow chest x-ray -Standard tube care, flush catheter daily  Shock, suspect related to sedating medications and diuresis. -Wean norepinephrine as we come down on sedation -Low threshold evaluate sepsis, consider empiric antibiotics if pressor needs increase -Hold diuresis 3/14, consider restart 3/15  Acute metabolic encephalopathy 2nd to hypoxia/hypercapnia, uremic encepahalopathy -Adjust RASS goal -2 as she had some hemodynamic instability, increased ventilator needs when sedation decreased -Versed, fentanyl  AKI from ATN- improving with diuresis Mild hyperkalemia - improving Hypernatremia -Push diuresis as blood pressure and renal function can tolerate, restart when off pressors -Consider initiation free water 3/14 -Follow BMP, urine output -Avoid nephrotoxins, renal dose medications  Anemia of critical illness. Thrombocytopenia, resolved. -Follow CBC -Hemoglobin transfusion threshold 7.0  Hyperglycemia. -Sliding scale insulin -Lantus 10 units daily  Hx of HLD. -Simvastatin  Constipation. -Bowel regimen as ordered  Best practice (evaluated daily)  Diet: tube feeds DVT prophylaxis: lovenox GI prophylaxis: protonix Mobility: Bedrest Disposition: ICU  Code Status: Full Family: Updated the patient's husband by phone 3/14  Labs    CMP Latest Ref Rng & Units 06/24/2020 06/23/2020 06/23/2020  Glucose 70 - 99 mg/dL 284(X) 324(M) 010(U)  BUN 8 - 23 mg/dL 725(D) 664(Q) 034(V)  Creatinine 0.44 - 1.00 mg/dL 4.25(Z) 5.63(O) 7.56(E)  Sodium 135 - 145 mmol/L 150(H) 148(H) 146(H)  Potassium 3.5 - 5.1 mmol/L 5.0  4.9 5.2(H)  Chloride 98 - 111 mmol/L 103 102 103  CO2 22 - 32 mmol/L 35(H) 35(H) 34(H)  Calcium 8.9 - 10.3 mg/dL 3.3(I) 9.5(J) 8.8(C)  Total Protein 6.5 - 8.1 g/dL - - 5.5(L)  Total Bilirubin 0.3 - 1.2 mg/dL - - 0.7  Alkaline Phos 38 - 126 U/L - - 106  AST 15 - 41 U/L - - 56(H)  ALT 0 - 44 U/L - - 237(H)    CBC Latest Ref Rng & Units 06/24/2020 06/23/2020 06/23/2020  WBC 4.0 - 10.5 K/uL 9.0 9.3 8.3  Hemoglobin 12.0 - 15.0 g/dL 1.6(S) 8.1(L) 8.3(L)  Hematocrit 36.0 - 46.0 % 30.9(L) 26.3(L) 26.7(L)  Platelets 150 - 400 K/uL 158 162 152    ABG    Component Value Date/Time   PHART 7.412 06/22/2020 1207   PCO2ART 56.0 (H) 06/22/2020 1207   PO2ART 78 (L) 06/22/2020 1207   HCO3 35.6 (H) 06/22/2020 1207   TCO2 37 (H) 06/22/2020 1207   ACIDBASEDEF 2.9 (H) 06/19/2020 0332   O2SAT 95.0 06/22/2020 1207    CBG (last 3)  Recent Labs    06/23/20 2318 06/24/20 0356 06/24/20 0735  GLUCAP 172* 169* 163*    Critical care time: 33 minutes   The patient is critically ill with multiple organ systems failure and requires high complexity decision making for assessment and support, frequent evaluation and titration of therapies, application of advanced monitoring technologies and extensive  interpretation of multiple databases.  Independent Critical Care Time: 33 Minutes.    Levy Pupa, MD, PhD 06/24/2020, 9:54 AM [facility] [phone] or if no answer before 7:00PM call [phone] For any issues after 7:00PM please call eLink [phone]

## 2020-06-24 NOTE — Progress Notes (Signed)
Arterial line remains under-damped despite manipulations/interventions. Consistent with previous night's shift. RT aware in order to further manipulate/replace line.

## 2020-06-25 ENCOUNTER — Inpatient Hospital Stay (HOSPITAL_COMMUNITY): Payer: BLUE CROSS/BLUE SHIELD

## 2020-06-25 DIAGNOSIS — J1282 Pneumonia due to coronavirus disease 2019: Secondary | ICD-10-CM | POA: Diagnosis not present

## 2020-06-25 DIAGNOSIS — U071 COVID-19: Secondary | ICD-10-CM | POA: Diagnosis not present

## 2020-06-25 DIAGNOSIS — J9601 Acute respiratory failure with hypoxia: Secondary | ICD-10-CM | POA: Diagnosis not present

## 2020-06-25 LAB — GLUCOSE, CAPILLARY
Glucose-Capillary: 167 mg/dL — ABNORMAL HIGH (ref 70–99)
Glucose-Capillary: 152 mg/dL — ABNORMAL HIGH (ref 70–99)
Glucose-Capillary: 116 mg/dL — ABNORMAL HIGH (ref 70–99)
Glucose-Capillary: 152 mg/dL — ABNORMAL HIGH (ref 70–99)
Glucose-Capillary: 159 mg/dL — ABNORMAL HIGH (ref 70–99)

## 2020-06-25 LAB — CBC
HCT: 26.6 % — ABNORMAL LOW (ref 36.0–46.0)
Hemoglobin: 8 g/dL — ABNORMAL LOW (ref 12.0–15.0)
MCH: 28.9 pg (ref 26.0–34.0)
MCHC: 30.1 g/dL (ref 30.0–36.0)
MCV: 96 fL (ref 80.0–100.0)
Platelets: 174 10*3/uL (ref 150–400)
RBC: 2.77 MIL/uL — ABNORMAL LOW (ref 3.87–5.11)
RDW: 17.1 % — ABNORMAL HIGH (ref 11.5–15.5)
WBC: 10.1 10*3/uL (ref 4.0–10.5)
nRBC: 0.2 % (ref 0.0–0.2)

## 2020-06-25 LAB — BASIC METABOLIC PANEL
Anion gap: 8 (ref 5–15)
BUN: 129 mg/dL — ABNORMAL HIGH (ref 8–23)
CO2: 39 mmol/L — ABNORMAL HIGH (ref 22–32)
Calcium: 8.5 mg/dL — ABNORMAL LOW (ref 8.9–10.3)
Chloride: 106 mmol/L (ref 98–111)
Creatinine, Ser: 2.53 mg/dL — ABNORMAL HIGH (ref 0.44–1.00)
GFR, Estimated: 21 mL/min — ABNORMAL LOW (ref >60)
Glucose, Bld: 181 mg/dL — ABNORMAL HIGH (ref 70–99)
Potassium: 5 mmol/L (ref 3.5–5.1)
Sodium: 153 mmol/L — ABNORMAL HIGH (ref 135–145)

## 2020-06-25 MED ORDER — FREE WATER
400.0000 mL | Status: DC
  Administered 2020-06-25 – 2020-06-26 (×6): 400 mL

## 2020-06-25 MED ORDER — ACETAMINOPHEN 325 MG PO TABS
650.0000 mg | ORAL_TABLET | Freq: Four times a day (QID) | ORAL | Status: DC | PRN
  Administered 2020-06-29 – 2020-07-01 (×4): 650 mg via ORAL
  Filled 2020-06-25 (×5): qty 2

## 2020-06-25 MED ORDER — ACETAMINOPHEN 160 MG/5ML PO SOLN
650.0000 mg | Freq: Four times a day (QID) | ORAL | Status: DC | PRN
  Administered 2020-06-25 – 2020-07-07 (×17): 650 mg
  Filled 2020-06-25 (×17): qty 20.3

## 2020-06-25 MED ORDER — ACETAMINOPHEN 650 MG RE SUPP: 650.0000 mg | Freq: Four times a day (QID) | RECTAL | Status: DC | PRN

## 2020-06-25 MED ORDER — WHITE PETROLATUM EX OINT
TOPICAL_OINTMENT | CUTANEOUS | Status: AC
  Filled 2020-06-25: qty 28.35

## 2020-06-25 NOTE — Progress Notes (Signed)
NAMEBailyn [name], MRN:  [id number], DOB:  1956-02-13, LOS: 25 ADMISSION DATE:  05/31/2020, CONSULTATION DATE: 06/15/2020 REFERRING MD: [facility], CHIEF COMPLAINT: Acute respiratory failure secondary to Covid with subsequent possible acquired pneumonia   Brief History:  65 yo female former smoker presented to [facility] on 05/31/20 with one week of progressive dyspnea from upper respiratory tract infection.  Found to have severe hypoxia with SpO2 50%.  She hadn't received COVID 19 vaccination.  She was treated for COVID 19 pneumonia with steroids, remdesivir and tocilizumab on 2/19.  She was started on ABx for possible HCAP.  She ultimately required intubation and transferred to [facility].  Pneumococcal urine antigen positive.  Past Medical History:  GERD, HLD, Nephrolithiasis  Significant Hospital Events:  2/18 Admit to [facility], start steroids and remdesivir, treated with tocilizumab 3/03 increasing O2 requirements, started on Bipap 3/05 intubated, transferred to [facility] 3/06 Lt pneumothorax >> pig tail catheter placed 3/07 hypoxia/hypotension/vent dys-synchrony >> improved after chest tube flushed 3/8 d/c steroids, start HCO3 gtt 3/10 d/c HCO3 gtt, off pressors 3/11-3/13 Diuresis  Consults:    Procedures:  ETT 3/05 >> Rt radial a line 3/06 >> Lt IJ CVL 3/06 >> Lt pig tail catheter 3/06 >>  Significant Diagnostic Tests:   CT chest 2/18 >> patchy b/l ASD  Doppler legs b/l 2/21 >> no DVT, 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 rate (!) 28, height 5\' 4"  (1.626 m), weight 80.4 kg, SpO2 95 %.    Vent Mode: PRVC FiO2 (%):  [60 %] 60 % Set Rate:  [32 bmp] 32 bmp Vt Set:  [380 mL] 380 mL PEEP:  [10 cmH20] 10 cmH20 Plateau Pressure:  [22 cmH20-33 cmH20] 31 cmH20   Intake/Output Summary (Last 24 hours) at 06/25/2020 06/27/2020 Last data filed at 06/25/2020 0800 Gross per 24 hour  Intake 2925.56 ml  Output 2675 ml  Net 250.56 ml   Filed Weights   06/23/20 0400 06/25/20 0027 06/25/20 0437  Weight: 84.5 kg 80.3 kg 80.4 kg   Physical Exam: General: Ill-appearing elderly woman, ventilated, no distress HENT: ET tube and OG tube in place, oropharynx otherwise clear, large stone Eyes: No icterus Respiratory: Decreased bilateral breath sounds, decreased at both bases, no wheeze.  Chest tube on the left without air leak on suction Cardiovascular: Regular, distant, borderline tacky, no murmur GI: Obese, nondistended, positive bowel sounds Extremities: 2+ bilateral hand edema, 1+ lower extremity edema Neuro: Sedated, does not respond to voice, some grimace with pain but does not open eyes, follow commands or interact  Chest x-ray 3/15 reviewed by me, bilateral infiltrates, tubes in good position, evolving rounded hypodensity right base, unclear whether this is intraparenchymal or pleural  Resolved Hospital Problem list   Non gap metabolic acidosis Elevated LFTs  Assessment & Plan:   Acute hypoxic/hypercapnic respiratory failure from COVID 19 pneumonia with ARDS complicated by Pneumococcal bacterial pneumonia, left pneumothorax Fever on 3/13, 3/14 -We will continue to hold  off antibiotics, follow clinical status.  Completed 7 days on 3/12 -Plan to repeat respiratory culture if she has recurrent fever -VT changed back to 7 cc/kg on 3/14, Pplat now less than 30. -Diuresis held on 3/14, dosing daily and plan to likely restart 3/16 -Sedation as per PAD protocol, did not tolerate  lightening, increased FiO2 needs and dyssynchrony.  Plan to continue RASS -2 to -3 for now with fentanyl, Versed  Lt pneumothorax.  No air leak Right basilar hypodensity, unclear whether pleural or intraparenchymal -Plan for CT chest 3/15 without contrast to better assess right basilar hypodensity -Continue left chest tube to suction while on positive pressure. -Standard tube care, flush catheter daily -Follow chest x-ray -Standard tube care, flush catheter daily  Shock, suspect related to sedating medications and diuresis.  Resolved -Pressors weaned off.  Diuresis held 3/13, hopefully restart 3/16  Acute metabolic encephalopathy 2nd to hypoxia/hypercapnia, uremic encepahalopathy -Adjust RASS goal -2 as she had some hemodynamic instability, increased ventilator needs when sedation decreased -Versed, fentanyl  AKI from ATN- improving with diuresis Mild hyperkalemia - improving Hypernatremia -Start free water 3/15 -Likely restart diuresis 3/16 as blood pressure, renal function will allow -Follow BMP, urine output closely -Avoid nephrotoxins, renal dose medications  Anemia of critical illness. Thrombocytopenia, resolved. -Follow CBC -Hemoglobin transfusion threshold 7.0  Hyperglycemia. -Slight scale insulin -Lantus 10 units daily  Hx of HLD. -Simvastatin  Constipation. -Bowel regimen as ordered  Best practice (evaluated daily)  Diet: tube feeds DVT prophylaxis: lovenox GI prophylaxis: protonix Mobility: Bedrest Disposition: ICU  Code Status: Full Family: Updated the patient's husband by phone 3/14  Labs    CMP Latest Ref Rng & Units 06/25/2020 06/24/2020 06/24/2020  Glucose 70 - 99 mg/dL 998(P) - 382(N)  BUN 8 - 23 mg/dL 053(Z) - 767(H)  Creatinine 0.44 - 1.00 mg/dL 4.19(F) - 7.90(W)  Sodium 135 - 145 mmol/L 153(H) 149(H) 150(H)  Potassium 3.5 - 5.1 mmol/L 5.0 4.6 5.0  Chloride 98 - 111 mmol/L 106 - 103  CO2 22 - 32 mmol/L 39(H) - 35(H)  Calcium 8.9 - 10.3 mg/dL  4.0(X) - 8.8(L)  Total Protein 6.5 - 8.1 g/dL - - -  Total Bilirubin 0.3 - 1.2 mg/dL - - -  Alkaline Phos 38 - 126 U/L - - -  AST 15 - 41 U/L - - -  ALT 0 - 44 U/L - - -    CBC Latest Ref Rng & Units 06/25/2020 06/24/2020 06/24/2020  WBC 4.0 - 10.5 K/uL 10.1 - 9.0  Hemoglobin 12.0 - 15.0 g/dL 8.0(L) 7.5(L) 9.4(L)  Hematocrit 36.0 - 46.0 % 26.6(L) 22.0(L) 30.9(L)  Platelets 150 - 400 K/uL 174 - 158    ABG    Component Value Date/Time   PHART 7.476 (H) 06/24/2020 1015   PCO2ART 55.3 (H) 06/24/2020 1015   PO2ART 69 (L) 06/24/2020 1015   HCO3 40.3 (H) 06/24/2020 1015   TCO2 42 (H) 06/24/2020 1015   ACIDBASEDEF 2.9 (H) 06/19/2020 0332   O2SAT 93.0 06/24/2020 1015    CBG (last 3)  Recent Labs    06/24/20 2340 06/25/20 0332 06/25/20 0735  GLUCAP 162* 167* 152*    Critical care time: 32 minutes   The patient is critically ill with multiple organ systems failure and requires high complexity decision making for assessment and support, frequent evaluation and titration of therapies, application of advanced monitoring technologies and extensive interpretation of multiple databases.    Independent Critical Care Time: 32 Minutes.    Molly Maduro  Delton Coombes, MD, PhD 06/25/2020, 8:38 AM [facility] [phone] or if no answer before 7:00PM call [phone] For any issues after 7:00PM please call eLink [phone]

## 2020-06-26 DIAGNOSIS — U071 COVID-19: Secondary | ICD-10-CM | POA: Diagnosis not present

## 2020-06-26 DIAGNOSIS — J9601 Acute respiratory failure with hypoxia: Secondary | ICD-10-CM | POA: Diagnosis not present

## 2020-06-26 DIAGNOSIS — J1282 Pneumonia due to coronavirus disease 2019: Secondary | ICD-10-CM | POA: Diagnosis not present

## 2020-06-26 LAB — BASIC METABOLIC PANEL
Anion gap: 7 (ref 5–15)
BUN: 116 mg/dL — ABNORMAL HIGH (ref 8–23)
CO2: 37 mmol/L — ABNORMAL HIGH (ref 22–32)
Calcium: 7.9 mg/dL — ABNORMAL LOW (ref 8.9–10.3)
Chloride: 108 mmol/L (ref 98–111)
Creatinine, Ser: 2.21 mg/dL — ABNORMAL HIGH (ref 0.44–1.00)
GFR, Estimated: 24 mL/min — ABNORMAL LOW (ref >60)
Glucose, Bld: 207 mg/dL — ABNORMAL HIGH (ref 70–99)
Potassium: 4.6 mmol/L (ref 3.5–5.1)
Sodium: 152 mmol/L — ABNORMAL HIGH (ref 135–145)

## 2020-06-26 LAB — POCT I-STAT 7, (LYTES, BLD GAS, ICA,H+H)
Acid-Base Excess: 14 mmol/L — ABNORMAL HIGH (ref 0.0–2.0)
Bicarbonate: 40.1 mmol/L — ABNORMAL HIGH (ref 20.0–28.0)
Calcium, Ion: 1.13 mmol/L — ABNORMAL LOW (ref 1.15–1.40)
HCT: 26 % — ABNORMAL LOW (ref 36.0–46.0)
Hemoglobin: 8.8 g/dL — ABNORMAL LOW (ref 12.0–15.0)
O2 Saturation: 86 %
Patient temperature: 37.9
Potassium: 4.2 mmol/L (ref 3.5–5.1)
Sodium: 151 mmol/L — ABNORMAL HIGH (ref 135–145)
TCO2: 42 mmol/L — ABNORMAL HIGH (ref 22–32)
pCO2 arterial: 64.7 mmHg — ABNORMAL HIGH (ref 32.0–48.0)
pH, Arterial: 7.404 (ref 7.350–7.450)
pO2, Arterial: 57 mmHg — ABNORMAL LOW (ref 83.0–108.0)

## 2020-06-26 LAB — CBC
HCT: 23.5 % — ABNORMAL LOW (ref 36.0–46.0)
Hemoglobin: 7 g/dL — ABNORMAL LOW (ref 12.0–15.0)
MCH: 29 pg (ref 26.0–34.0)
MCHC: 29.8 g/dL — ABNORMAL LOW (ref 30.0–36.0)
MCV: 97.5 fL (ref 80.0–100.0)
Platelets: 157 10*3/uL (ref 150–400)
RBC: 2.41 MIL/uL — ABNORMAL LOW (ref 3.87–5.11)
RDW: 16.9 % — ABNORMAL HIGH (ref 11.5–15.5)
WBC: 8.4 10*3/uL (ref 4.0–10.5)
nRBC: 0 % (ref 0.0–0.2)

## 2020-06-26 LAB — GLUCOSE, CAPILLARY
Glucose-Capillary: 103 mg/dL — ABNORMAL HIGH (ref 70–99)
Glucose-Capillary: 116 mg/dL — ABNORMAL HIGH (ref 70–99)
Glucose-Capillary: 145 mg/dL — ABNORMAL HIGH (ref 70–99)
Glucose-Capillary: 159 mg/dL — ABNORMAL HIGH (ref 70–99)
Glucose-Capillary: 161 mg/dL — ABNORMAL HIGH (ref 70–99)
Glucose-Capillary: 163 mg/dL — ABNORMAL HIGH (ref 70–99)
Glucose-Capillary: 192 mg/dL — ABNORMAL HIGH (ref 70–99)

## 2020-06-26 MED ORDER — FREE WATER
400.0000 mL | Status: DC
  Administered 2020-06-26 – 2020-07-02 (×45): 400 mL

## 2020-06-26 MED ORDER — FUROSEMIDE 10 MG/ML IJ SOLN
40.0000 mg | Freq: Two times a day (BID) | INTRAMUSCULAR | Status: AC
  Administered 2020-06-26 – 2020-06-27 (×2): 40 mg via INTRAVENOUS
  Filled 2020-06-26 (×2): qty 4

## 2020-06-26 MED ORDER — SODIUM CHLORIDE 0.9 % IV SOLN
2.0000 g | INTRAVENOUS | Status: DC
  Administered 2020-06-26 – 2020-06-28 (×3): 2 g via INTRAVENOUS
  Filled 2020-06-26 (×3): qty 20

## 2020-06-26 NOTE — Progress Notes (Signed)
RT NOTE: patient does not meet requirements for SBT this AM due to PEEP/FIO2 requirements.  Tolerating current ventilator settings well.  Will continue to monitor.

## 2020-06-26 NOTE — Progress Notes (Signed)
ABG obtained per ARDS protocol for attempt at weaning FIO2 to 50%.  Sample obtained on ventilator settings of VT: 380, RR: 32, FIO2: 50%, and PEEP: 10.  Sats correlated with sats of 85% on monitor.  Increased FIO2 back to 60% with sats currently 91%.  Will continue to monitor.    Ref. Range 06/26/2020 09:08  Sample type Unknown ARTERIAL  pH, Arterial Latest Ref Range: 7.350 - 7.450  7.404  pCO2 arterial Latest Ref Range: 32.0 - 48.0 mmHg 64.7 (H)  pO2, Arterial Latest Ref Range: 83.0 - 108.0 mmHg 57 (L)  TCO2 Latest Ref Range: 22 - 32 mmol/L 42 (H)  Acid-Base Excess Latest Ref Range: 0.0 - 2.0 mmol/L 14.0 (H)  Bicarbonate Latest Ref Range: 20.0 - 28.0 mmol/L 40.1 (H)  O2 Saturation Latest Units: % 86.0  Patient temperature Unknown 37.9 C  Collection site Unknown art line

## 2020-06-26 NOTE — Progress Notes (Signed)
NAMEVioletta [name], MRN:  [id number], DOB:  [birth year], LOS: 26 ADMISSION DATE:  05/31/2020, CONSULTATION DATE: 06/15/2020 REFERRING MD: [facility], CHIEF COMPLAINT: Acute respiratory failure secondary to Covid with subsequent possible acquired pneumonia   Brief History:  65 yo female former smoker presented to [facility] on 05/31/20 with one week of progressive dyspnea from upper respiratory tract infection.  Found to have severe hypoxia with SpO2 50%.  She hadn't received COVID 19 vaccination.  She was treated for COVID 19 pneumonia with steroids, remdesivir and tocilizumab on 2/19.  She was started on ABx for possible HCAP.  She ultimately required intubation and transferred to [facility].  Pneumococcal urine antigen positive.  Past Medical History:  GERD, HLD, Nephrolithiasis  Significant Hospital Events:  2/18 Admit to [facility], start steroids and remdesivir, treated with tocilizumab 3/03 increasing O2 requirements, started on Bipap 3/05 intubated, transferred to [facility] 3/06 Lt pneumothorax >> pig tail catheter placed 3/07 hypoxia/hypotension/vent dys-synchrony >> improved after chest tube flushed 3/8 d/c steroids, start HCO3 gtt 3/10 d/c HCO3 gtt, off pressors 3/11-3/13 Diuresis  Consults:    Procedures:  ETT 3/05 >> Rt radial a line 3/06 >> Lt IJ CVL 3/06 >> Lt pig tail catheter 3/06 >>  Significant Diagnostic Tests:   CT chest 2/18 >> patchy b/l ASD  Doppler legs b/l 2/21 >> no DVT, 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  No clear air-fluid levels.  Left side pigtail in place without pneumothorax  Micro Data:  COVID 2/18 >> positive MRSA PCR 3/05 >> negative Blood 3/05 >> negative Pneumococcal urine Ag 3/05 >> positive  Antimicrobials:  Vancomycin 3/05 >> 3/07 Zosyn 3/05 Cefepime 3/05 >> 3/07 Rocephin 3/07 >> 3/12  Interim History  / Subjective:   T-max 101 0.8 0.60, PEEP 10 Fentanyl 100, Versed 1 I/O+ 15.4 L total Na 152 Serum creatinine 2.53 > 2.21   Objective   Blood pressure 116/63, pulse (!) 109, temperature (!) 100.76 F (38.2 C), resp. rate (!) 33, height 5\' 4"  (1.626 m), weight 78.6 kg, SpO2 91 %.    Vent Mode: PRVC FiO2 (%):  [50 %-60 %] 50 % Set Rate:  [32 bmp] 32 bmp Vt Set:  [380 mL] 380 mL PEEP:  [10 cmH20] 10 cmH20 Plateau Pressure:  [25 cmH20-31 cmH20] 25 cmH20   Intake/Output Summary (Last 24 hours) at 06/26/2020 1101 Last data filed at 06/26/2020 06/28/2020 Gross per 24 hour  Intake 4261.83 ml  Output 2715 ml  Net 1546.83 ml   Filed Weights   06/25/20 0027 06/25/20 0437 06/26/20 0315  Weight: 80.3 kg 80.4 kg 78.6 kg   Physical Exam: General: Ill-appearing elderly woman, ventilated, no distress HENT: ET tube and OG tube in place, oropharynx otherwise clear, large stone Eyes: No icterus Respiratory: Decreased bilateral breath sounds, decreased at both bases, no wheeze.  Chest tube on the left without air leak on suction Cardiovascular: Regular, distant, borderline tacky, no murmur GI: Obese, nondistended, positive bowel sounds Extremities: 2+ bilateral hand edema, 1+ lower extremity edema Neuro: Sedated, does not respond to voice, some grimace with pain but does not open eyes, follow commands or interact  Chest x-ray 3/15 reviewed by me, bilateral infiltrates, tubes in good position, evolving rounded hypodensity right base, unclear whether this is intraparenchymal or pleural  Resolved Hospital Problem list   Non gap metabolic acidosis Elevated LFTs  Assessment & Plan:  Acute hypoxic/hypercapnic respiratory failure from COVID 19 pneumonia with ARDS complicated by Pneumococcal bacterial pneumonia, left pneumothorax Fever 3/13 - 3/16  -Given cavitated areas on CT chest, continued fever believe that we should restart her ceftriaxone, follow.  Possible that 7 days antibiotics for her  pneumonia was an adequate -Repeat respiratory culture prior to initiation antibiotics -Tolerating tidal volumes 7 cc/kg, target Pplat <30 -Okay to restart diuretics on 3/16, note also need to address hypernatremia and increase free water -Sedation as per PAD protocol.  Plan to lighten on 3/16 but then back to RASS goal -2 to -3 -Little change over the last several days and her overall status.  FiO2 and PEEP are not weaning.  She may be headed for tracheostomy  Lt pneumothorax.  No air leak Right basilar hypodensity, unclear whether pleural or intraparenchymal -Plan for CT chest 3/15 without contrast to better assess right basilar hypodensity -Continue left chest tube to suction while on positive pressure. -Standard tube care, flush catheter daily -Follow chest x-ray -Standard tube care, flush catheter daily  Shock, suspect related to sedating medications and diuresis.  Resolved -Follow blood pressure, careful with sedating medications and diuretics  Acute metabolic encephalopathy 2nd to hypoxia/hypercapnia, uremic encepahalopathy -RASS goal currently -2 to -3 to facilitate vent synchrony.  Plan for dedicated wake up assessment 3/16 -Versed, fentanyl  AKI from ATN- improving with diuresis Mild hyperkalemia - improving Hypernatremia -Increase free water 3/16 -Restart diuresis 3/16 -Follow BMP, urine output -Avoid nephrotoxins, renal dose medications  Anemia of critical illness. Thrombocytopenia, resolved. -Follow CBC -Hemoglobin transfusion threshold 7.0  Hyperglycemia. -Sliding scale insulin -Lantus 10 units daily  Hx of HLD. -Simvastatin  Constipation. -Bowel regimen as ordered  Best practice (evaluated daily)  Diet: tube feeds DVT prophylaxis: lovenox GI prophylaxis: protonix Mobility: Bedrest Disposition: ICU  Code Status: Full Family: Updated the patient's husband by phone 3/14  Labs    CMP Latest Ref Rng & Units 06/26/2020 06/26/2020 06/25/2020  Glucose 70 -  99 mg/dL - 182(X) 937(J)  BUN 8 - 23 mg/dL - 696(V) 893(Y)  Creatinine 0.44 - 1.00 mg/dL - 1.01(B) 5.10(C)  Sodium 135 - 145 mmol/L 151(H) 152(H) 153(H)  Potassium 3.5 - 5.1 mmol/L 4.2 4.6 5.0  Chloride 98 - 111 mmol/L - 108 106  CO2 22 - 32 mmol/L - 37(H) 39(H)  Calcium 8.9 - 10.3 mg/dL - 7.9(L) 8.5(L)  Total Protein 6.5 - 8.1 g/dL - - -  Total Bilirubin 0.3 - 1.2 mg/dL - - -  Alkaline Phos 38 - 126 U/L - - -  AST 15 - 41 U/L - - -  ALT 0 - 44 U/L - - -    CBC Latest Ref Rng & Units 06/26/2020 06/26/2020 06/25/2020  WBC 4.0 - 10.5 K/uL - 8.4 10.1  Hemoglobin 12.0 - 15.0 g/dL 5.8(N) 7.0(L) 8.0(L)  Hematocrit 36.0 - 46.0 % 26.0(L) 23.5(L) 26.6(L)  Platelets 150 - 400 K/uL - 157 174    ABG    Component Value Date/Time   PHART 7.404 06/26/2020 0908   PCO2ART 64.7 (H) 06/26/2020 0908   PO2ART 57 (L) 06/26/2020 0908   HCO3 40.1 (H) 06/26/2020 0908   TCO2 42 (H) 06/26/2020 0908   ACIDBASEDEF 2.9 (H) 06/19/2020 0332   O2SAT 86.0 06/26/2020 0908    CBG (last 3)  Recent Labs    06/26/20 0004 06/26/20 0316 06/26/20 0719  GLUCAP 103* 192* 159*    Critical care time: 32 minutes   The patient is critically ill with  multiple organ systems failure and requires high complexity decision making for assessment and support, frequent evaluation and titration of therapies, application of advanced monitoring technologies and extensive interpretation of multiple databases.    Independent Critical Care Time: 32 Minutes.    Levy Pupa, MD, PhD 06/26/2020, 11:01 AM [facility] [phone] or if no answer before 7:00PM call [phone] For any issues after 7:00PM please call eLink [phone]

## 2020-06-27 ENCOUNTER — Inpatient Hospital Stay (HOSPITAL_COMMUNITY): Payer: BLUE CROSS/BLUE SHIELD

## 2020-06-27 DIAGNOSIS — J1282 Pneumonia due to coronavirus disease 2019: Secondary | ICD-10-CM | POA: Diagnosis not present

## 2020-06-27 DIAGNOSIS — U071 COVID-19: Secondary | ICD-10-CM | POA: Diagnosis not present

## 2020-06-27 LAB — CBC
HCT: 24.7 % — ABNORMAL LOW (ref 36.0–46.0)
Hemoglobin: 7.4 g/dL — ABNORMAL LOW (ref 12.0–15.0)
MCH: 28.7 pg (ref 26.0–34.0)
MCHC: 30 g/dL (ref 30.0–36.0)
MCV: 95.7 fL (ref 80.0–100.0)
Platelets: 186 10*3/uL (ref 150–400)
RBC: 2.58 MIL/uL — ABNORMAL LOW (ref 3.87–5.11)
RDW: 16.3 % — ABNORMAL HIGH (ref 11.5–15.5)
WBC: 9.4 10*3/uL (ref 4.0–10.5)
nRBC: 0.2 % (ref 0.0–0.2)

## 2020-06-27 LAB — COMPREHENSIVE METABOLIC PANEL
ALT: 96 U/L — ABNORMAL HIGH (ref 0–44)
AST: 66 U/L — ABNORMAL HIGH (ref 15–41)
Albumin: 1.5 g/dL — ABNORMAL LOW (ref 3.5–5.0)
Alkaline Phosphatase: 93 U/L (ref 38–126)
Anion gap: 9 (ref 5–15)
BUN: 94 mg/dL — ABNORMAL HIGH (ref 8–23)
CO2: 37 mmol/L — ABNORMAL HIGH (ref 22–32)
Calcium: 7.5 mg/dL — ABNORMAL LOW (ref 8.9–10.3)
Chloride: 102 mmol/L (ref 98–111)
Creatinine, Ser: 1.93 mg/dL — ABNORMAL HIGH (ref 0.44–1.00)
GFR, Estimated: 29 mL/min — ABNORMAL LOW (ref >60)
Glucose, Bld: 160 mg/dL — ABNORMAL HIGH (ref 70–99)
Potassium: 3.9 mmol/L (ref 3.5–5.1)
Sodium: 148 mmol/L — ABNORMAL HIGH (ref 135–145)
Total Bilirubin: 0.9 mg/dL (ref 0.3–1.2)
Total Protein: 5.8 g/dL — ABNORMAL LOW (ref 6.5–8.1)

## 2020-06-27 LAB — GLUCOSE, CAPILLARY
Glucose-Capillary: 132 mg/dL — ABNORMAL HIGH (ref 70–99)
Glucose-Capillary: 135 mg/dL — ABNORMAL HIGH (ref 70–99)
Glucose-Capillary: 146 mg/dL — ABNORMAL HIGH (ref 70–99)
Glucose-Capillary: 153 mg/dL — ABNORMAL HIGH (ref 70–99)
Glucose-Capillary: 155 mg/dL — ABNORMAL HIGH (ref 70–99)
Glucose-Capillary: 168 mg/dL — ABNORMAL HIGH (ref 70–99)

## 2020-06-27 LAB — MAGNESIUM: Magnesium: 2.1 mg/dL (ref 1.7–2.4)

## 2020-06-27 MED ORDER — FUROSEMIDE 10 MG/ML IJ SOLN
40.0000 mg | Freq: Two times a day (BID) | INTRAMUSCULAR | Status: AC
  Administered 2020-06-27 (×2): 40 mg via INTRAVENOUS
  Filled 2020-06-27 (×2): qty 4

## 2020-06-27 NOTE — Progress Notes (Signed)
NAMEAdiyah [name], MRN:  [id number], DOB:  05-11-1955, LOS: 27 ADMISSION DATE:  05/31/2020, CONSULTATION DATE: 06/15/2020 REFERRING MD: [facility], CHIEF COMPLAINT: Acute respiratory failure secondary to Covid with subsequent possible acquired pneumonia   Brief History:  65 yo female former smoker presented to [facility] on 05/31/20 with one week of progressive dyspnea from upper respiratory tract infection.  Found to have severe hypoxia with SpO2 50%.  She hadn't received COVID 19 vaccination.  She was treated for COVID 19 pneumonia with steroids, remdesivir and tocilizumab on 2/19.  She was started on ABx for possible HCAP.  She ultimately required intubation and transferred to [facility].  Pneumococcal urine antigen positive.  Past Medical History:  GERD, HLD, Nephrolithiasis  Significant Hospital Events:  2/18 Admit to [facility], start steroids and remdesivir, treated with tocilizumab 3/03 increasing O2 requirements, started on Bipap 3/05 intubated, transferred to [facility] 3/06 Lt pneumothorax >> pig tail catheter placed 3/07 hypoxia/hypotension/vent dys-synchrony >> improved after chest tube flushed 3/8 d/c steroids, start HCO3 gtt 3/10 d/c HCO3 gtt, off pressors 3/11-3/13 Diuresis  Consults:    Procedures:  ETT 3/05 >> Rt radial a line 3/06 >> Lt IJ CVL 3/06 >> Lt pig tail catheter 3/06 >>  Significant Diagnostic Tests:   CT chest 2/18 >> patchy b/l ASD  Doppler legs b/l 2/21 >> no DVT, 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  No clear air-fluid levels.  Left side pigtail in place without pneumothorax  Micro Data:  COVID 2/18 >> positive MRSA PCR 3/05 >> negative Blood 3/05 >> negative Pneumococcal urine Ag 3/05 >> positive  Antimicrobials:  Vancomycin 3/05 >> 3/07 Zosyn 3/05 Cefepime 3/05 >> 3/07 Rocephin 3/07 >> 3/12,  3/16>>>  Interim History / Subjective:  Remains on 0.60, peep 10  Weaning sedation   Not tol SBT   Objective   Blood pressure 119/70, pulse (!) 107, temperature (!) 100.58 F (38.1 C), resp. rate (!) 27, height 5\' 4"  (1.626 m), weight 81.6 kg, SpO2 90 %.    Vent Mode: PRVC FiO2 (%):  [60 %] 60 % Set Rate:  [15 bmp-32 bmp] 32 bmp Vt Set:  [380 mL] 380 mL PEEP:  [10 cmH20] 10 cmH20 Plateau Pressure:  [28 cmH20-30 cmH20] 29 cmH20   Intake/Output Summary (Last 24 hours) at 06/27/2020 06/29/2020 Last data filed at 06/27/2020 0900 Gross per 24 hour  Intake 3843.12 ml  Output 3815 ml  Net 28.12 ml   Filed Weights   06/25/20 0437 06/26/20 0315 06/27/20 0428  Weight: 80.4 kg 78.6 kg 81.6 kg   Physical Exam: General: Ill-appearing elderly woman, ventilated, no distress HENT: ET tube and OG tube in place, oropharynx otherwise clear, large stone Eyes: No icterus Respiratory: resps even non labored on vent, coarse, Chest tube on the left without air leak on suction Cardiovascular: distant  GI: Obese, nondistended, positive bowel sounds Extremities: 1-2+ generalized edema upper>lower Neuro: Sedated, does not respond to voice, some grimace with pain but does not open eyes, follow commands or interact   Resolved Hospital Problem list   Non gap metabolic acidosis Elevated LFTs  Assessment & Plan:   Acute hypoxic/hypercapnic respiratory failure from COVID 19 pneumonia with ARDS complicated by Pneumococcal bacterial pneumonia, left pneumothorax Fever 3/13 - 3/16  -abx restarted 3/16 given ongoing fever and cavitated areas on CT chest- ? 7 days antibiotics for her pneumonia was  an adequate -follow repeat resp culture  -Tolerating tidal volumes 7 cc/kg, target Pplat <30 -repeat lasix 40mg  IV q12 - watch Na  -Sedation as per PAD protocol.  Lighten as able  -Little change over the last several days and her overall status.  FiO2 and PEEP are not weaning.   Likely headed for tracheostomy  Lt  pneumothorax.  No air leak Right basilar hypodensity, unclear whether pleural or intraparenchymal -Continue left chest tube to suction while on positive pressure. -Standard tube care, flush catheter daily -Follow chest x-ray -Standard tube care, flush catheter daily  Shock, suspect related to sedating medications and diuresis.  Resolved -Follow blood pressure, careful with sedating medications and diuretics  Acute metabolic encephalopathy 2nd to hypoxia/hypercapnia, uremic encepahalopathy -RASS goal currently -2 to -3 to facilitate vent synchrony.   - daily WUA  -Versed, fentanyl - wean as able   AKI from ATN- improving with diuresis Mild hyperkalemia - improving Hypernatremia -continue free water -repeat lasix today as above  -Follow BMP, urine output -Avoid nephrotoxins, renal dose medications  Anemia of critical illness. Thrombocytopenia, resolved. -Follow CBC -Hemoglobin transfusion threshold 7.0  Hyperglycemia. -Sliding scale insulin -Lantus 10 units daily  Hx of HLD. -Simvastatin  Constipation. -Bowel regimen as ordered  Best practice (evaluated daily)  Diet: tube feeds DVT prophylaxis: lovenox GI prophylaxis: protonix Mobility: Bedrest Disposition: ICU  Code Status: Full Family: Updated the patient's husband by phone 3/14  Labs    CMP Latest Ref Rng & Units 06/27/2020 06/26/2020 06/26/2020  Glucose 70 - 99 mg/dL 06/28/2020) - 102(H)  BUN 8 - 23 mg/dL 852(D) - 78(E)  Creatinine 0.44 - 1.00 mg/dL 423(N) - 3.61(W)  Sodium 135 - 145 mmol/L 148(H) 151(H) 152(H)  Potassium 3.5 - 5.1 mmol/L 3.9 4.2 4.6  Chloride 98 - 111 mmol/L 102 - 108  CO2 22 - 32 mmol/L 37(H) - 37(H)  Calcium 8.9 - 10.3 mg/dL 7.5(L) - 7.9(L)  Total Protein 6.5 - 8.1 g/dL 4.31(V) - -  Total Bilirubin 0.3 - 1.2 mg/dL 0.9 - -  Alkaline Phos 38 - 126 U/L 93 - -  AST 15 - 41 U/L 66(H) - -  ALT 0 - 44 U/L 96(H) - -    CBC Latest Ref Rng & Units 06/27/2020 06/26/2020 06/26/2020  WBC 4.0 - 10.5 K/uL  9.4 - 8.4  Hemoglobin 12.0 - 15.0 g/dL 7.4(L) 8.8(L) 7.0(L)  Hematocrit 36.0 - 46.0 % 24.7(L) 26.0(L) 23.5(L)  Platelets 150 - 400 K/uL 186 - 157    ABG    Component Value Date/Time   PHART 7.404 06/26/2020 0908   PCO2ART 64.7 (H) 06/26/2020 0908   PO2ART 57 (L) 06/26/2020 0908   HCO3 40.1 (H) 06/26/2020 0908   TCO2 42 (H) 06/26/2020 0908   ACIDBASEDEF 2.9 (H) 06/19/2020 0332   O2SAT 86.0 06/26/2020 0908    CBG (last 3)  Recent Labs    06/26/20 2327 06/27/20 0330 06/27/20 0742  GLUCAP 161* 155* 135*    Critical care time: 32 minutes   The patient is critically ill with multiple organ systems failure and requires high complexity decision making for assessment and support, frequent evaluation and titration of therapies, application of advanced monitoring technologies and extensive interpretation of multiple databases.    Independent Critical Care Time: 32 Minutes.   06/29/20, NP Pulmonary/Critical Care Medicine  06/27/2020  9:27 AM

## 2020-06-27 NOTE — Progress Notes (Signed)
Nutrition Follow-up  DOCUMENTATION CODES:   Not applicable  INTERVENTION:   Continue tube feeding via OG tube: - Vital 1.5 @ 60 ml/hr (1440 ml/day) - ProSource TF 45 ml daily - Free water flushes per CCM, currently 400 ml q 3 hours  Tube feeding regimen provides 2200 kcal, 108 grams of protein, and 1094 ml of H2O.   Total free water with flushes: 4294 ml  NUTRITION DIAGNOSIS:   Increased nutrient needs related to acute illness (COVID-19 pneumonia) as evidenced by estimated needs.  Ongoing, being addressed via TF  GOAL:   Provide needs based on ASPEN/SCCM guidelines  Met via TF  MONITOR:   Vent status,TF tolerance,Labs,Weight trends  REASON FOR ASSESSMENT:   LOS    ASSESSMENT:   Patient transferred to ICU for heated high flow with BiPAP. She  presented with acute hypoxic respiratory failure due to COVID-19 pneumonia. Complaint of decreased appetite and general weakness prior to admission.  2/18 - admitted to [facility]  3/05 - intubated, transferred to [facility] 3/06 - L pneumothorax with pigtail cath placed  Discussed pt with RN and during ICU rounds. Pt tolerating current tube feeding without issue. Pt remains somnolent and per MD have not been successful with vent wean. CCM has started discussion GOC with family.  Pt with free water flushes in place for hypernatremia. Weight up to 81.6 kg.  Admit weight: 72.6 kg Current weight: 81.6 kg Lowest weight: 69.7 kg  Pt with +1 pitting edema to BUE and BLE as well as perineal region.  Current TF: Vital 1.5 @ 60 ml/hr, ProSource TF 45 ml daily, free water flushes of 400 ml q 3 hours  Patient remains intubated on ventilator support MV: 11.3 L/min Temp (24hrs), Avg:101.2 F (38.4 C), Min:100.58 F (38.1 C), Max:102.02 F (38.9 C)  Drips: Fentanyl  Medications reviewed and include: colace, lasix, SSI q 4 hours, lantus 10 units daily, protonix, miralax, senna, IV abx  Labs reviewed: sodium 148 (trending down), BUN 94  (trending down), creatinine 1.93 (trending down), elevated LFTs, hemoglobin 7.4 CBG's: 116-163 x 24 hours  UOP: 3920 ml x 24 hours CT: 10 ml x 24 hours I/O's: +15.3 L since admit  Diet Order:   Diet Order            Diet NPO time specified  Diet effective now                 EDUCATION NEEDS:   Not appropriate for education at this time  Skin:  Skin Assessment: Skin Integrity Issues: Other: skin tear to buttocks x 2  Last BM:  06/26/20  Height:   Ht Readings from Last 1 Encounters:  06/22/20 '5\' 4"'  (1.626 m)    Weight:   Wt Readings from Last 1 Encounters:  06/27/20 81.6 kg    BMI:  Body mass index is 30.88 kg/m.  Estimated Nutritional Needs:   Kcal:  2100-2400 kcals  Protein:  105-120 g  Fluid:  >2000 ml daily    Gustavus Bryant, MS, RD, LDN Inpatient Clinical Dietitian Please see AMiON for contact information.

## 2020-06-28 ENCOUNTER — Inpatient Hospital Stay (HOSPITAL_COMMUNITY): Payer: BLUE CROSS/BLUE SHIELD

## 2020-06-28 DIAGNOSIS — U071 COVID-19: Secondary | ICD-10-CM | POA: Diagnosis not present

## 2020-06-28 DIAGNOSIS — J80 Acute respiratory distress syndrome: Secondary | ICD-10-CM | POA: Diagnosis not present

## 2020-06-28 DIAGNOSIS — J13 Pneumonia due to Streptococcus pneumoniae: Secondary | ICD-10-CM | POA: Diagnosis not present

## 2020-06-28 DIAGNOSIS — R0603 Acute respiratory distress: Secondary | ICD-10-CM | POA: Diagnosis not present

## 2020-06-28 LAB — GLUCOSE, CAPILLARY
Glucose-Capillary: 138 mg/dL — ABNORMAL HIGH (ref 70–99)
Glucose-Capillary: 145 mg/dL — ABNORMAL HIGH (ref 70–99)
Glucose-Capillary: 153 mg/dL — ABNORMAL HIGH (ref 70–99)
Glucose-Capillary: 157 mg/dL — ABNORMAL HIGH (ref 70–99)
Glucose-Capillary: 117 mg/dL — ABNORMAL HIGH (ref 70–99)
Glucose-Capillary: 162 mg/dL — ABNORMAL HIGH (ref 70–99)

## 2020-06-28 LAB — CBC
HCT: 24.5 % — ABNORMAL LOW (ref 36.0–46.0)
Hemoglobin: 7.3 g/dL — ABNORMAL LOW (ref 12.0–15.0)
MCH: 28.3 pg (ref 26.0–34.0)
MCHC: 29.8 g/dL — ABNORMAL LOW (ref 30.0–36.0)
MCV: 95 fL (ref 80.0–100.0)
Platelets: 189 10*3/uL (ref 150–400)
RBC: 2.58 MIL/uL — ABNORMAL LOW (ref 3.87–5.11)
RDW: 15.8 % — ABNORMAL HIGH (ref 11.5–15.5)
WBC: 9.3 10*3/uL (ref 4.0–10.5)
nRBC: 0 % (ref 0.0–0.2)

## 2020-06-28 LAB — BASIC METABOLIC PANEL
Anion gap: 11 (ref 5–15)
BUN: 82 mg/dL — ABNORMAL HIGH (ref 8–23)
CO2: 35 mmol/L — ABNORMAL HIGH (ref 22–32)
Calcium: 7.1 mg/dL — ABNORMAL LOW (ref 8.9–10.3)
Chloride: 100 mmol/L (ref 98–111)
Creatinine, Ser: 1.65 mg/dL — ABNORMAL HIGH (ref 0.44–1.00)
GFR, Estimated: 35 mL/min — ABNORMAL LOW (ref >60)
Glucose, Bld: 177 mg/dL — ABNORMAL HIGH (ref 70–99)
Potassium: 3.3 mmol/L — ABNORMAL LOW (ref 3.5–5.1)
Sodium: 146 mmol/L — ABNORMAL HIGH (ref 135–145)

## 2020-06-28 LAB — PHOSPHORUS: Phosphorus: 4.2 mg/dL (ref 2.5–4.6)

## 2020-06-28 LAB — MAGNESIUM: Magnesium: 1.8 mg/dL (ref 1.7–2.4)

## 2020-06-28 MED ORDER — POTASSIUM CHLORIDE 20 MEQ PO PACK
20.0000 meq | PACK | ORAL | Status: AC
  Administered 2020-06-28 (×2): 20 meq
  Filled 2020-06-28 (×2): qty 1

## 2020-06-28 MED ORDER — MAGNESIUM SULFATE 2 GM/50ML IV SOLN
2.0000 g | Freq: Once | INTRAVENOUS | Status: AC
  Administered 2020-06-28: 2 g via INTRAVENOUS
  Filled 2020-06-28: qty 50

## 2020-06-28 MED ORDER — POTASSIUM CHLORIDE 10 MEQ/50ML IV SOLN
10.0000 meq | INTRAVENOUS | Status: AC
  Administered 2020-06-28 (×4): 10 meq via INTRAVENOUS
  Filled 2020-06-28 (×4): qty 50

## 2020-06-28 MED ORDER — ENOXAPARIN SODIUM 40 MG/0.4ML ~~LOC~~ SOLN
40.0000 mg | Freq: Every day | SUBCUTANEOUS | Status: DC
  Administered 2020-06-28 – 2020-06-30 (×3): 40 mg via SUBCUTANEOUS
  Filled 2020-06-28 (×3): qty 0.4

## 2020-06-28 NOTE — Progress Notes (Signed)
Pharmacy Electrolyte Replacement  Recent Labs:  Recent Labs    06/28/20 0546  K 3.3*  MG 1.8  PHOS 4.2  CREATININE 1.65*    Low Critical Values (K </= 2.5, Phos </= 1, Mg </= 1) Present: Mg = 1.8 and None  MD Contacted: NA  Plan: Mg 2g per protocol   Jani Gravel, PharmD-Candidate  06/28/20 0902

## 2020-06-28 NOTE — Progress Notes (Signed)
NAMEDontasia [name], MRN:  [id number], DOB:  26-Sep-1955, LOS: 28 ADMISSION DATE:  05/31/2020, CONSULTATION DATE: 06/15/2020 REFERRING MD: [facility], CHIEF COMPLAINT: Acute respiratory failure secondary to Covid with subsequent possible acquired pneumonia   Brief History:  65 yo female former smoker presented to [facility] on 05/31/20 with one week of progressive dyspnea from upper respiratory tract infection.  Found to have severe hypoxia with SpO2 50%.  She hadn't received COVID 19 vaccination.  She was treated for COVID 19 pneumonia with steroids, remdesivir and tocilizumab on 2/19.  She was started on ABx for possible HCAP.  She ultimately required intubation and transferred to [facility].  Pneumococcal urine antigen positive.  Past Medical History:  GERD, HLD, Nephrolithiasis  Significant Hospital Events:  2/18 Admit to [facility], start steroids and remdesivir, treated with tocilizumab 3/03 increasing O2 requirements, started on Bipap 3/05 intubated, transferred to [facility] 3/06 Lt pneumothorax >> pig tail catheter placed 3/07 hypoxia/hypotension/vent dys-synchrony >> improved after chest tube flushed 3/8 d/c steroids, start HCO3 gtt 3/10 d/c HCO3 gtt, off pressors 3/11-3/13 Diuresis  Consults:    Procedures:  ETT 3/05 >> Rt radial a line 3/06 >> Lt IJ CVL 3/06 >> Lt pig tail catheter 3/06 >>  Significant Diagnostic Tests:   CT chest 2/18 >> patchy b/l ASD  Doppler legs b/l 2/21 >> no DVT, 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  No clear air-fluid levels.  Left side pigtail in place without pneumothorax  Micro Data:  COVID 2/18 >> positive MRSA PCR 3/05 >> negative Blood 3/05 >> negative Pneumococcal urine Ag 3/05 >> positive Sputum 3/17 >>   Antimicrobials:  Vancomycin 3/05 >> 3/07 Zosyn 3/05 Cefepime 3/05 >> 3/07 Rocephin 3/07 >> 3/12,  3/16>>>  Interim History / Subjective:  Remains on increased FiO2 and PEEP.  RR 34.  No air leak.  Objective   Blood pressure 129/72, pulse (!) 112, temperature (!) 101.3 F (38.5 C), resp. rate (!) 30, height 5\' 4"  (1.626 m), weight 81.6 kg, SpO2 93 %.    Vent Mode: PRVC FiO2 (%):  [60 %] 60 % Set Rate:  [32 bmp] 32 bmp Vt Set:  [380 mL] 380 mL PEEP:  [10 cmH20] 10 cmH20 Plateau Pressure:  [30 cmH20] 30 cmH20   Intake/Output Summary (Last 24 hours) at 06/28/2020 0916 Last data filed at 06/28/2020 06/30/2020 Gross per 24 hour  Intake 3025.02 ml  Output 3930 ml  Net -904.98 ml   Filed Weights   06/26/20 0315 06/27/20 0428 06/28/20 0500  Weight: 78.6 kg 81.6 kg 81.6 kg   Physical Exam:  General - sedated Eyes - pupils reactive ENT - ETT in place Cardiac - regular, tachycardic Chest - b/l crackles, Lt chest tube w/o air leak Abdomen - soft, non tender, + bowel sounds Extremities - 1+ edema Skin - no rashes Neuro - RASS -3   Resolved Hospital Problem list   Non gap metabolic acidosis, Elevated LFTs, Hypotension from meds, Septic shock, AKI from ATN 2nd to sepsis, Thrombocytopenia from sepsis  Assessment & Plan:   Acute hypoxic/hypercapnic respiratory failure from COVID 19 pneumonia with ARDS complicated by Pneumococcal bacterial pneumonia, left pneumothorax. - had progressive pneumatoceles on CT chest from 3/15 and persistent fever from 3/13 to 3/16 - restarted rocephin on 3/16 - f/u CXR - will eventually need trach if family wishes to continue aggressive care  Lt  pneumothorax. - continue chest tube to suction while on increased PEEP - flush chest tube qshift  Acute metabolic encephalopathy 2nd to hypoxia/hypercapnia, uremic encepahalopathy - RASS goal -1 to -2  Hypernatremia - continue free water - f/u BMET - hold lasix for now  Anemia of critical illness.. - f/u CBC - transfuse for Hb < 7 or significant bleeding  Hyperglycemia. - SSI with lantus 10 units  daily  Hx of HLD. - Simvastatin  Constipation. - Bowel regimen as ordered  Goals of care. - concern her chances for recovery are getting more grim; have ongoing discussions with patients family  Best practice (evaluated daily)  Diet: tube feeds DVT prophylaxis: lovenox GI prophylaxis: protonix Mobility: Bedrest Disposition: ICU  Code Status: Full  Labs    CMP Latest Ref Rng & Units 06/28/2020 06/27/2020 06/26/2020  Glucose 70 - 99 mg/dL 294(T) 654(Y) -  BUN 8 - 23 mg/dL 50(P) 54(S) -  Creatinine 0.44 - 1.00 mg/dL 5.68(L) 2.75(T) -  Sodium 135 - 145 mmol/L 146(H) 148(H) 151(H)  Potassium 3.5 - 5.1 mmol/L 3.3(L) 3.9 4.2  Chloride 98 - 111 mmol/L 100 102 -  CO2 22 - 32 mmol/L 35(H) 37(H) -  Calcium 8.9 - 10.3 mg/dL 7.1(L) 7.5(L) -  Total Protein 6.5 - 8.1 g/dL - 5.8(L) -  Total Bilirubin 0.3 - 1.2 mg/dL - 0.9 -  Alkaline Phos 38 - 126 U/L - 93 -  AST 15 - 41 U/L - 66(H) -  ALT 0 - 44 U/L - 96(H) -    CBC Latest Ref Rng & Units 06/28/2020 06/27/2020 06/26/2020  WBC 4.0 - 10.5 K/uL 9.3 9.4 -  Hemoglobin 12.0 - 15.0 g/dL 7.3(L) 7.4(L) 8.8(L)  Hematocrit 36.0 - 46.0 % 24.5(L) 24.7(L) 26.0(L)  Platelets 150 - 400 K/uL 189 186 -    ABG    Component Value Date/Time   PHART 7.404 06/26/2020 0908   PCO2ART 64.7 (H) 06/26/2020 0908   PO2ART 57 (L) 06/26/2020 0908   HCO3 40.1 (H) 06/26/2020 0908   TCO2 42 (H) 06/26/2020 0908   ACIDBASEDEF 2.9 (H) 06/19/2020 0332   O2SAT 86.0 06/26/2020 0908    CBG (last 3)  Recent Labs    06/27/20 2356 06/28/20 0415 06/28/20 0747  GLUCAP 168* 138* 145*    Critical care time: 34 minutes  Coralyn Helling, MD [place] Pulmonary/Critical Care Pager - [phone] 06/28/2020, 9:25 AM

## 2020-06-28 NOTE — Progress Notes (Signed)
Spoke with pt's husband.  Updated about current status.  Expressed concern that she is not improving, and would likely need trach and long term vent support.  He wants to continue aggressive therapies, including tracheostomy if needed.  Will monitor her status through the weekend, and then determine early next week if/when she would need tracheostomy.  Coralyn Helling, MD [facility] Pulmonary/Critical Care Pager - [phone] 06/28/2020, 12:32 PM

## 2020-06-28 NOTE — Progress Notes (Signed)
[facility] ADULT ICU REPLACEMENT PROTOCOL   The patient does apply for the [facility] Adult ICU Electrolyte Replacment Protocol based on the criteria listed below:   1. Is GFR >/= 30 ml/min? Yes.    Patient's GFR today is 35 2. Is SCr </= 2? Yes.   Patient's SCr is 1.65 ml/kg/hr 3. Did SCr increase >/= 0.5 in 24 hours? No. 4. Abnormal electrolyte(s): K 3.3 5. Ordered repletion with: protocol 6. If a panic level lab has been reported, has the CCM MD in charge been notified? Yes.  .   Physician:  Shawn Stall R Naseem Adler 06/28/2020 7:13 AM

## 2020-06-29 ENCOUNTER — Inpatient Hospital Stay (HOSPITAL_COMMUNITY): Payer: BLUE CROSS/BLUE SHIELD

## 2020-06-29 DIAGNOSIS — R0603 Acute respiratory distress: Secondary | ICD-10-CM | POA: Diagnosis not present

## 2020-06-29 DIAGNOSIS — J189 Pneumonia, unspecified organism: Secondary | ICD-10-CM | POA: Diagnosis not present

## 2020-06-29 DIAGNOSIS — J1282 Pneumonia due to coronavirus disease 2019: Secondary | ICD-10-CM | POA: Diagnosis not present

## 2020-06-29 DIAGNOSIS — U071 COVID-19: Secondary | ICD-10-CM | POA: Diagnosis not present

## 2020-06-29 LAB — GLUCOSE, CAPILLARY
Glucose-Capillary: 146 mg/dL — ABNORMAL HIGH (ref 70–99)
Glucose-Capillary: 150 mg/dL — ABNORMAL HIGH (ref 70–99)
Glucose-Capillary: 201 mg/dL — ABNORMAL HIGH (ref 70–99)
Glucose-Capillary: 158 mg/dL — ABNORMAL HIGH (ref 70–99)
Glucose-Capillary: 98 mg/dL (ref 70–99)
Glucose-Capillary: 176 mg/dL — ABNORMAL HIGH (ref 70–99)

## 2020-06-29 LAB — CBC
HCT: 21.5 % — ABNORMAL LOW (ref 36.0–46.0)
Hemoglobin: 6.5 g/dL — CL (ref 12.0–15.0)
MCH: 28.5 pg (ref 26.0–34.0)
MCHC: 30.2 g/dL (ref 30.0–36.0)
MCV: 94.3 fL (ref 80.0–100.0)
Platelets: 195 10*3/uL (ref 150–400)
RBC: 2.28 MIL/uL — ABNORMAL LOW (ref 3.87–5.11)
RDW: 15.9 % — ABNORMAL HIGH (ref 11.5–15.5)
WBC: 7.7 10*3/uL (ref 4.0–10.5)
nRBC: 0 % (ref 0.0–0.2)

## 2020-06-29 LAB — BASIC METABOLIC PANEL
Anion gap: 7 (ref 5–15)
BUN: 67 mg/dL — ABNORMAL HIGH (ref 8–23)
CO2: 35 mmol/L — ABNORMAL HIGH (ref 22–32)
Calcium: 6.9 mg/dL — ABNORMAL LOW (ref 8.9–10.3)
Chloride: 106 mmol/L (ref 98–111)
Creatinine, Ser: 1.44 mg/dL — ABNORMAL HIGH (ref 0.44–1.00)
GFR, Estimated: 41 mL/min — ABNORMAL LOW (ref >60)
Glucose, Bld: 180 mg/dL — ABNORMAL HIGH (ref 70–99)
Potassium: 3.2 mmol/L — ABNORMAL LOW (ref 3.5–5.1)
Sodium: 148 mmol/L — ABNORMAL HIGH (ref 135–145)

## 2020-06-29 LAB — ABO/RH: ABO/RH(D): O POS

## 2020-06-29 LAB — PREPARE RBC (CROSSMATCH)

## 2020-06-29 MED ORDER — POTASSIUM CHLORIDE 10 MEQ/50ML IV SOLN
10.0000 meq | INTRAVENOUS | Status: AC
  Administered 2020-06-29 (×4): 10 meq via INTRAVENOUS
  Filled 2020-06-29 (×4): qty 50

## 2020-06-29 MED ORDER — FUROSEMIDE 10 MG/ML IJ SOLN
40.0000 mg | Freq: Once | INTRAMUSCULAR | Status: AC
  Administered 2020-06-29: 40 mg via INTRAVENOUS
  Filled 2020-06-29: qty 4

## 2020-06-29 MED ORDER — SODIUM CHLORIDE 0.9 % IV SOLN
2.0000 g | Freq: Two times a day (BID) | INTRAVENOUS | Status: DC
  Administered 2020-06-29 – 2020-07-01 (×5): 2 g via INTRAVENOUS
  Filled 2020-06-29 (×6): qty 2

## 2020-06-29 MED ORDER — POTASSIUM CHLORIDE 20 MEQ PO PACK
20.0000 meq | PACK | ORAL | Status: AC
  Administered 2020-06-29 (×2): 20 meq
  Filled 2020-06-29 (×2): qty 1

## 2020-06-29 MED ORDER — LINEZOLID 600 MG/300ML IV SOLN
600.0000 mg | Freq: Two times a day (BID) | INTRAVENOUS | Status: AC
  Administered 2020-06-29 – 2020-07-05 (×14): 600 mg via INTRAVENOUS
  Filled 2020-06-29 (×14): qty 300

## 2020-06-29 MED ORDER — SODIUM CHLORIDE 0.9% IV SOLUTION: Freq: Once | INTRAVENOUS | Status: AC

## 2020-06-29 NOTE — Progress Notes (Signed)
NAMEAyda [name], MRN:  [id number], DOB:  06/22/1955, LOS: 29 ADMISSION DATE:  05/31/2020, CONSULTATION DATE: 06/15/2020 REFERRING MD: [facility], CHIEF COMPLAINT: Acute respiratory failure secondary to Covid with subsequent possible acquired pneumonia   Brief History:  65 yo female former smoker presented to [facility] on 05/31/20 with one week of progressive dyspnea from upper respiratory tract infection.  Found to have severe hypoxia with SpO2 50%.  She hadn't received COVID 19 vaccination.  She was treated for COVID 19 pneumonia with steroids, remdesivir and tocilizumab on 2/19.  She was started on ABx for possible HCAP.  She ultimately required intubation and transferred to [facility].  Pneumococcal urine antigen positive.  Past Medical History:  GERD, HLD, Nephrolithiasis  Significant Hospital Events:  2/18 Admit to [facility], start steroids and remdesivir, treated with tocilizumab 3/03 increasing O2 requirements, started on Bipap 3/05 intubated, transferred to [facility] 3/06 Lt pneumothorax >> pig tail catheter placed 3/07 hypoxia/hypotension/vent dys-synchrony >> improved after chest tube flushed 3/8 d/c steroids, start HCO3 gtt 3/10 d/c HCO3 gtt, off pressors 3/11-3/13 Diuresis 3/19 persistent fever, change ABx, transfuse 1 unit PRBC  Consults:    Procedures:  ETT 3/05 >> Rt radial a line 3/06 >> Lt IJ CVL 3/06 >> Lt pig tail catheter 3/06 >>  Significant Diagnostic Tests:   CT chest 2/18 >> patchy b/l ASD  Doppler legs b/l 2/21 >> no DVT, 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  No clear air-fluid levels.  Left side pigtail in place without pneumothorax  Micro Data:  COVID 2/18 >> positive MRSA PCR 3/05 >> negative Blood 3/05 >> negative Pneumococcal urine Ag 3/05 >> positive Sputum 3/19 >>   Antimicrobials:  Vancomycin 3/05 >>  3/07 Zosyn 3/05 Cefepime 3/05 >> 3/07 Rocephin 3/07 >> 3/12, 3/16>>> 3/19 Linezolid 3/19 >> Elita Quick 3/19 >>   Interim History / Subjective:  Getting PRBC transfusion.  Still having fever.  Remains on increased PEEP/FiO2.  Objective   Blood pressure 113/67, pulse (!) 111, temperature (!) 101.48 F (38.6 C), resp. rate (!) 35, height 5\' 4"  (1.626 m), weight 80.1 kg, SpO2 94 %.    Vent Mode: PRVC FiO2 (%):  [55 %-60 %] 60 % Set Rate:  [32 bmp-35 bmp] 32 bmp Vt Set:  [380 mL] 380 mL PEEP:  [10 cmH20] 10 cmH20 Plateau Pressure:  [14 cmH20] 14 cmH20   Intake/Output Summary (Last 24 hours) at 06/29/2020 1116 Last data filed at 06/29/2020 0944 Gross per 24 hour  Intake 2097.6 ml  Output 2485 ml  Net -387.4 ml   Filed Weights   06/28/20 0500 06/29/20 0000 06/29/20 0426  Weight: 81.6 kg 80.1 kg 80.1 kg   Physical Exam:  General - sedated Eyes - pupils reactive ENT - ETT in place Cardiac - regular rate/rhythm, no murmur Chest - b/l rhonchi with more bronchial breath sounds Rt base, Lt chest tube w/o air leak Abdomen - soft, non tender, + bowel sounds Extremities - 1+ edema Skin - no rashes Neuro - RASS -2  Resolved Hospital Problem list   Non gap metabolic acidosis, Elevated LFTs, Hypotension from meds, Septic shock, AKI from ATN 2nd to sepsis, Thrombocytopenia from sepsis  Assessment & Plan:   Acute hypoxic/hypercapnic respiratory failure from COVID 19 pneumonia with ARDS complicated by Pneumococcal bacterial pneumonia, left pneumothorax. - had progressive pneumatoceles on CT chest from 3/15 and persistent fever  from 3/13 to 3/16 - restarted ABx 3/16; change to linezolid and fortaz 3/19 - f/u CXR - family would be agreeable to trach if needed; will likely need to plan for this next week  Lt pneumothorax. - change to -10 cm H2O suction on 3/19 - flush chest tube qshift  Acute metabolic encephalopathy 2nd to hypoxia/hypercapnia, uremic encepahalopathy - RASS goal -1 to  -2  Hypernatremia - continue free water - f/u BMET - hold lasix for now  Hypokalemia. - replace potassium  Anemia of critical illness.. - f/u CBC after PRBC transfusion on 3/19 - transfuse for Hb < 7 or significant bleeding  Hyperglycemia. - SSI with lantus 10 units daily  Hx of HLD. - Simvastatin  Constipation. - Bowel regimen as ordered  Goals of care. - updated pt's husband on 3/18; he is still hopeful she can recover, and would like to continue all aggressive measures  Best practice (evaluated daily)  Diet: tube feeds DVT prophylaxis: lovenox GI prophylaxis: protonix Mobility: Bedrest Disposition: ICU  Code Status: Full  Labs    CMP Latest Ref Rng & Units 06/29/2020 06/28/2020 06/27/2020  Glucose 70 - 99 mg/dL 403(K) 742(V) 956(L)  BUN 8 - 23 mg/dL 87(F) 64(P) 32(R)  Creatinine 0.44 - 1.00 mg/dL 5.18(A) 4.16(S) 0.63(K)  Sodium 135 - 145 mmol/L 148(H) 146(H) 148(H)  Potassium 3.5 - 5.1 mmol/L 3.2(L) 3.3(L) 3.9  Chloride 98 - 111 mmol/L 106 100 102  CO2 22 - 32 mmol/L 35(H) 35(H) 37(H)  Calcium 8.9 - 10.3 mg/dL 6.9(L) 7.1(L) 7.5(L)  Total Protein 6.5 - 8.1 g/dL - - 5.8(L)  Total Bilirubin 0.3 - 1.2 mg/dL - - 0.9  Alkaline Phos 38 - 126 U/L - - 93  AST 15 - 41 U/L - - 66(H)  ALT 0 - 44 U/L - - 96(H)    CBC Latest Ref Rng & Units 06/29/2020 06/28/2020 06/27/2020  WBC 4.0 - 10.5 K/uL 7.7 9.3 9.4  Hemoglobin 12.0 - 15.0 g/dL 6.5(LL) 7.3(L) 7.4(L)  Hematocrit 36.0 - 46.0 % 21.5(L) 24.5(L) 24.7(L)  Platelets 150 - 400 K/uL 195 189 186    ABG    Component Value Date/Time   PHART 7.404 06/26/2020 0908   PCO2ART 64.7 (H) 06/26/2020 0908   PO2ART 57 (L) 06/26/2020 0908   HCO3 40.1 (H) 06/26/2020 0908   TCO2 42 (H) 06/26/2020 0908   ACIDBASEDEF 2.9 (H) 06/19/2020 0332   O2SAT 86.0 06/26/2020 0908    CBG (last 3)  Recent Labs    06/28/20 2318 06/29/20 0327 06/29/20 0746  GLUCAP 162* 146* 150*    Critical care time: 38 minutes  Coralyn Helling, MD [place]  Pulmonary/Critical Care Pager - [phone] 06/29/2020, 11:16 AM

## 2020-06-29 NOTE — Progress Notes (Signed)
Pharmacy Antibiotic Note  Rachel [name] is a 65 y.o. female admitted on 05/31/2020 with persistent fever and concern for HAP.  Pharmacy has been consulted for Ceftazidime dosing. Patient has been on Ceftriaxone for concern of Streptococcus pneumoniae with pseudoceles on CXR needing longer course. Despite lengthening Ceftriaxone, patient has continued to spike fevers and tachycardic. WBC is within normal limits.   Also adding Linezolid per MD for MRSA coverage.  New cultures sent 3/19 AM.  SCr improving with estimated CrCl ~ 41 mL/min.   Plan: Start Ceftazidime 2g IV every 12 hours.  Monitor renal function, culture results, and clinical status.   Height: 5\' 4"  (162.6 cm) Weight: 80.1 kg (176 lb 9.4 oz) IBW/kg (Calculated) : 54.7  Temp (24hrs), Avg:101.1 F (38.4 C), Min:100.4 F (38 C), Max:102.02 F (38.9 C)  Recent Labs  Lab 06/25/20 0450 06/26/20 0401 06/27/20 0500 06/28/20 0546 06/29/20 0416  WBC 10.1 8.4 9.4 9.3 7.7  CREATININE 2.53* 2.21* 1.93* 1.65* 1.44*    Estimated Creatinine Clearance: 41 mL/min (A) (by C-G formula based on SCr of 1.44 mg/dL (H)).    No Known Allergies  Antimicrobials this admission: Vancomycin 3/5>>3/7 Zosyn 3/5>>3/5 Cefep 3/5>>3/7 Remdesivir 2/18 >> 2/22 Ceftriaxone 3/7>> 3/12, 3/16>>3/19 Ceftazidime 3/19 >>  Microbiology results: 3/5BCx: neg 3/5UCx: neg 3/5MRSA PCR: Negative  2/18 SARS2-CV positive 3/5 strep pneumo urinary antigen positive 3/17 TA >> 3/19 TA >>  Thank you for allowing pharmacy to be a part of this patient's care.  4/19, PharmD, BCPS, BCCCP Clinical Pharmacist Please refer to [facility] for [facility] Pharmacy numbers 06/29/2020 7:56 AM

## 2020-06-29 NOTE — Progress Notes (Signed)
eLink Physician-Brief Progress Note Patient Name: Rachel [name] DOB: [birth year] MRN: [id number]   Date of Service  06/29/2020  HPI/Events of Note  Multiple issues: 1. Hgb = 6.5 and 2. CA++ = 6.9 which corrects to 8.9 (Normal) given albumin = 1.5.   eICU Interventions  Plan: 1. Transfuse 1 unit PRBC now. 2. No intervention indicated for Ca++.     Intervention Category Major Interventions: Electrolyte abnormality - evaluation and management;Other:  Lenell Antu 06/29/2020, 5:23 AM

## 2020-06-29 NOTE — Progress Notes (Signed)
Pt AM K+ 3.2 with Creat 1.44 and GFR 41. Will initiate CCM ELink electrolyte protocol.

## 2020-06-30 ENCOUNTER — Inpatient Hospital Stay (HOSPITAL_COMMUNITY): Payer: BLUE CROSS/BLUE SHIELD

## 2020-06-30 DIAGNOSIS — J9601 Acute respiratory failure with hypoxia: Secondary | ICD-10-CM | POA: Diagnosis not present

## 2020-06-30 DIAGNOSIS — J189 Pneumonia, unspecified organism: Secondary | ICD-10-CM | POA: Diagnosis not present

## 2020-06-30 DIAGNOSIS — U071 COVID-19: Secondary | ICD-10-CM | POA: Diagnosis not present

## 2020-06-30 DIAGNOSIS — J1282 Pneumonia due to coronavirus disease 2019: Secondary | ICD-10-CM | POA: Diagnosis not present

## 2020-06-30 LAB — BPAM RBC
Blood Product Expiration Date: 202204202359
ISSUE DATE / TIME: 202203190926
Unit Type and Rh: 5100

## 2020-06-30 LAB — TYPE AND SCREEN
ABO/RH(D): O POS
Antibody Screen: NEGATIVE
Unit division: 0

## 2020-06-30 LAB — BASIC METABOLIC PANEL
Anion gap: 8 (ref 5–15)
BUN: 59 mg/dL — ABNORMAL HIGH (ref 8–23)
CO2: 31 mmol/L (ref 22–32)
Calcium: 6.5 mg/dL — ABNORMAL LOW (ref 8.9–10.3)
Chloride: 102 mmol/L (ref 98–111)
Creatinine, Ser: 1.27 mg/dL — ABNORMAL HIGH (ref 0.44–1.00)
GFR, Estimated: 48 mL/min — ABNORMAL LOW (ref >60)
Glucose, Bld: 179 mg/dL — ABNORMAL HIGH (ref 70–99)
Potassium: 3.2 mmol/L — ABNORMAL LOW (ref 3.5–5.1)
Sodium: 141 mmol/L (ref 135–145)

## 2020-06-30 LAB — CBC
HCT: 25.3 % — ABNORMAL LOW (ref 36.0–46.0)
Hemoglobin: 7.8 g/dL — ABNORMAL LOW (ref 12.0–15.0)
MCH: 28.9 pg (ref 26.0–34.0)
MCHC: 30.8 g/dL (ref 30.0–36.0)
MCV: 93.7 fL (ref 80.0–100.0)
Platelets: 199 10*3/uL (ref 150–400)
RBC: 2.7 MIL/uL — ABNORMAL LOW (ref 3.87–5.11)
RDW: 15.6 % — ABNORMAL HIGH (ref 11.5–15.5)
WBC: 8.2 10*3/uL (ref 4.0–10.5)
nRBC: 0 % (ref 0.0–0.2)

## 2020-06-30 LAB — CULTURE, RESPIRATORY W GRAM STAIN

## 2020-06-30 LAB — GLUCOSE, CAPILLARY
Glucose-Capillary: 141 mg/dL — ABNORMAL HIGH (ref 70–99)
Glucose-Capillary: 122 mg/dL — ABNORMAL HIGH (ref 70–99)
Glucose-Capillary: 192 mg/dL — ABNORMAL HIGH (ref 70–99)
Glucose-Capillary: 120 mg/dL — ABNORMAL HIGH (ref 70–99)
Glucose-Capillary: 95 mg/dL (ref 70–99)
Glucose-Capillary: 167 mg/dL — ABNORMAL HIGH (ref 70–99)

## 2020-06-30 LAB — MAGNESIUM: Magnesium: 2 mg/dL (ref 1.7–2.4)

## 2020-06-30 MED ORDER — POTASSIUM CHLORIDE 10 MEQ/50ML IV SOLN
10.0000 meq | INTRAVENOUS | Status: AC
Start: 2020-06-30 — End: 2020-06-30
  Administered 2020-06-30 (×4): 10 meq via INTRAVENOUS
  Filled 2020-06-30 (×4): qty 50

## 2020-06-30 MED ORDER — FUROSEMIDE 10 MG/ML IJ SOLN
40.0000 mg | Freq: Once | INTRAMUSCULAR | Status: AC
  Administered 2020-06-30: 40 mg via INTRAVENOUS
  Filled 2020-06-30: qty 4

## 2020-06-30 MED ORDER — POTASSIUM CHLORIDE 20 MEQ PO PACK
20.0000 meq | PACK | ORAL | Status: AC
Start: 2020-06-30 — End: 2020-06-30
  Administered 2020-06-30 (×2): 20 meq
  Filled 2020-06-30 (×2): qty 1

## 2020-06-30 MED ORDER — POTASSIUM CHLORIDE 20 MEQ PO PACK
40.0000 meq | PACK | Freq: Once | ORAL | Status: AC
  Administered 2020-06-30: 40 meq
  Filled 2020-06-30: qty 2

## 2020-06-30 NOTE — Progress Notes (Signed)
[name], MRN:  [id number], DOB:  [birth year], LOS: 30 ADMISSION DATE:  05/31/2020, CONSULTATION DATE: 06/15/2020 REFERRING MD: [facility], CHIEF COMPLAINT: Acute respiratory failure secondary to Covid with subsequent possible acquired pneumonia   Brief History:  65 yo female former smoker presented to [facility] on 05/31/20 with one week of progressive dyspnea from upper respiratory tract infection.  Found to have severe hypoxia with SpO2 50%.  She hadn't received COVID 19 vaccination.  She was treated for COVID 19 pneumonia with steroids, remdesivir and tocilizumab on 2/19.  She was started on ABx for possible HCAP.  She ultimately required intubation and transferred to [facility].  Pneumococcal urine antigen positive.  Past Medical History:  GERD, HLD, Nephrolithiasis  Significant Hospital Events:  2/18 Admit to [facility], start steroids and remdesivir, treated with tocilizumab 3/03 increasing O2 requirements, started on Bipap 3/05 intubated, transferred to [facility] 3/06 Lt pneumothorax >> pig tail catheter placed 3/07 hypoxia/hypotension/vent dys-synchrony >> improved after chest tube flushed 3/8 d/c steroids, start HCO3 gtt 3/10 d/c HCO3 gtt, off pressors 3/11-3/13 Diuresis 3/19 persistent fever, change ABx, transfuse 1 unit PRBC  Consults:    Procedures:  ETT 3/05 >> Rt radial a line 3/06 >> Lt IJ CVL 3/06 >> Lt pig tail catheter 3/06 >>  Significant Diagnostic Tests:   CT chest 2/18 >> patchy b/l ASD  Doppler legs b/l 2/21 >> no DVT, 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  No clear air-fluid levels.  Left side pigtail in place without pneumothorax  Micro Data:  COVID 2/18 >> positive MRSA PCR 3/05 >> negative Blood 3/05 >> negative Pneumococcal urine Ag 3/05 >> positive Sputum 3/17 >>  Sputum 3/19 >>   Antimicrobials:   Vancomycin 3/05 >> 3/07 Zosyn 3/05 Cefepime 3/05 >> 3/07 Rocephin 3/07 >> 3/12, 3/16>>> 3/19 Linezolid 3/19 >> Elita Quick 3/19 >>   Interim History / Subjective:  Tm 100.5.  More alert with WUA.  Changed to PEEP 8 and FiO2 45%.  Objective   Blood pressure 102/65, pulse 100, temperature 99.86 F (37.7 C), resp. rate (!) 32, height 5\' 4"  (1.626 m), weight 80.1 kg, SpO2 97 %.    Vent Mode: PRVC FiO2 (%):  [45 %-60 %] 45 % Set Rate:  [26 bmp-32 bmp] 26 bmp Vt Set:  [380 mL-420 mL] 420 mL PEEP:  [8 cmH20-10 cmH20] 8 cmH20 Plateau Pressure:  [14 cmH20-38 cmH20] 38 cmH20   Intake/Output Summary (Last 24 hours) at 06/30/2020 0850 Last data filed at 06/30/2020 0600 Gross per 24 hour  Intake 11965.51 ml  Output 2865 ml  Net 9100.51 ml   Filed Weights   06/29/20 0000 06/29/20 0426 06/30/20 0435  Weight: 80.1 kg 80.1 kg 80.1 kg   Physical Exam:  General - sedated Eyes - pupils reactive ENT - ETT in place Cardiac - regular rate/rhythm, no murmur Chest - b/l rhonchi with bronchial breath sounds Rt base, no air leak with Lt chest tube Abdomen - soft, non tender, + bowel sounds Extremities - 1+ edema Skin - no rashes Neuro - RASS 0, moves extremities and follows commands during [facility] Problem list   Non gap metabolic acidosis, Elevated LFTs, Hypotension from meds, Septic shock, AKI from ATN 2nd to sepsis, Thrombocytopenia from sepsis, Hypernatremia  Assessment & Plan:   Acute hypoxic/hypercapnic respiratory failure from COVID 19 pneumonia with ARDS complicated by  Pneumococcal bacterial pneumonia, left pneumothorax. - had progressive pneumatoceles on CT chest from 3/15 and persistent fever from 3/13 to 3/16 - restarted ABx 3/16; changed to linezolid and fortaz on 3/19; fever curve and O2 needs improved since - change vent settings to PRVC 26, Vt 420, PEEP 8, FiO2 45% on 3/20 - goal SpO2 > 88% - f/u CXR - lasix 40 mg IV x one on 3/20 - family would be agreeable to  trach if needed; will likely need to plan for this next week  Lt pneumothorax. - change chest tube to water seal on 3/20 - flush chest tube qshift  Acute metabolic encephalopathy 2nd to hypoxia/hypercapnia, uremic encepahalopathy - RASS goal -1 to -2  Hypokalemia. - replace potassium and f/u BMET  Anemia of critical illness.. - f/u CBC - transfuse for Hb < 7 or significant bleeding  Hyperglycemia. - SSI with lantus 10 units daily  Hx of HLD. - Simvastatin  Constipation. - Bowel regimen as ordered  Goals of care. - updated pt's husband on 3/18; he is still hopeful she can recover, and would like to continue all aggressive measures  Best practice (evaluated daily)  Diet: tube feeds DVT prophylaxis: lovenox GI prophylaxis: protonix Mobility: Bedrest Disposition: ICU  Code Status: Full  Labs    CMP Latest Ref Rng & Units 06/30/2020 06/29/2020 06/28/2020  Glucose 70 - 99 mg/dL 149(F) 026(V) 785(Y)  BUN 8 - 23 mg/dL 85(O) 27(X) 41(O)  Creatinine 0.44 - 1.00 mg/dL 8.78(M) 7.67(M) 0.94(B)  Sodium 135 - 145 mmol/L 141 148(H) 146(H)  Potassium 3.5 - 5.1 mmol/L 3.2(L) 3.2(L) 3.3(L)  Chloride 98 - 111 mmol/L 102 106 100  CO2 22 - 32 mmol/L 31 35(H) 35(H)  Calcium 8.9 - 10.3 mg/dL 0.9(G) 6.9(L) 7.1(L)  Total Protein 6.5 - 8.1 g/dL - - -  Total Bilirubin 0.3 - 1.2 mg/dL - - -  Alkaline Phos 38 - 126 U/L - - -  AST 15 - 41 U/L - - -  ALT 0 - 44 U/L - - -    CBC Latest Ref Rng & Units 06/30/2020 06/29/2020 06/28/2020  WBC 4.0 - 10.5 K/uL 8.2 7.7 9.3  Hemoglobin 12.0 - 15.0 g/dL 7.8(L) 6.5(LL) 7.3(L)  Hematocrit 36.0 - 46.0 % 25.3(L) 21.5(L) 24.5(L)  Platelets 150 - 400 K/uL 199 195 189    ABG    Component Value Date/Time   PHART 7.404 06/26/2020 0908   PCO2ART 64.7 (H) 06/26/2020 0908   PO2ART 57 (L) 06/26/2020 0908   HCO3 40.1 (H) 06/26/2020 0908   TCO2 42 (H) 06/26/2020 0908   ACIDBASEDEF 2.9 (H) 06/19/2020 0332   O2SAT 86.0 06/26/2020 0908    CBG (last 3)   Recent Labs    06/29/20 2324 06/30/20 0337 06/30/20 0729  GLUCAP 176* 141* 122*    Critical care time: 33 minutes  Coralyn Helling, MD [place] [facility] Pager - [phone] 06/30/2020, 8:50 AM

## 2020-06-30 NOTE — Progress Notes (Signed)
AM K+ 3.2 with creat 1.27 and GFR 48. CCM ELink electrolyte protocol initiated.

## 2020-06-30 NOTE — Plan of Care (Signed)
  Problem: Education: Goal: Knowledge of General Education information will improve Description: Including pain rating scale, medication(s)/side effects and non-pharmacologic comfort measures Outcome: Not Progressing   Problem: Health Behavior/Discharge Planning: Goal: Ability to manage health-related needs will improve Outcome: Not Progressing   Problem: Clinical Measurements: Goal: Ability to maintain clinical measurements within normal limits will improve Outcome: Progressing Goal: Will remain free from infection Outcome: Not Progressing Goal: Diagnostic test results will improve Outcome: Not Progressing Goal: Respiratory complications will improve Outcome: Not Progressing Goal: Cardiovascular complication will be avoided Outcome: Progressing   Problem: Activity: Goal: Risk for activity intolerance will decrease Outcome: Not Progressing   Problem: Nutrition: Goal: Adequate nutrition will be maintained Outcome: Progressing   Problem: Coping: Goal: Level of anxiety will decrease Outcome: Not Progressing

## 2020-07-01 ENCOUNTER — Inpatient Hospital Stay (HOSPITAL_COMMUNITY): Payer: BLUE CROSS/BLUE SHIELD

## 2020-07-01 DIAGNOSIS — J1282 Pneumonia due to coronavirus disease 2019: Secondary | ICD-10-CM | POA: Diagnosis not present

## 2020-07-01 DIAGNOSIS — U071 COVID-19: Secondary | ICD-10-CM | POA: Diagnosis not present

## 2020-07-01 DIAGNOSIS — J9601 Acute respiratory failure with hypoxia: Secondary | ICD-10-CM | POA: Diagnosis not present

## 2020-07-01 LAB — CULTURE, RESPIRATORY W GRAM STAIN

## 2020-07-01 LAB — GLUCOSE, CAPILLARY
Glucose-Capillary: 108 mg/dL — ABNORMAL HIGH (ref 70–99)
Glucose-Capillary: 115 mg/dL — ABNORMAL HIGH (ref 70–99)
Glucose-Capillary: 128 mg/dL — ABNORMAL HIGH (ref 70–99)
Glucose-Capillary: 138 mg/dL — ABNORMAL HIGH (ref 70–99)
Glucose-Capillary: 178 mg/dL — ABNORMAL HIGH (ref 70–99)
Glucose-Capillary: 196 mg/dL — ABNORMAL HIGH (ref 70–99)

## 2020-07-01 LAB — CBC
HCT: 23.6 % — ABNORMAL LOW (ref 36.0–46.0)
Hemoglobin: 7.3 g/dL — ABNORMAL LOW (ref 12.0–15.0)
MCH: 29.1 pg (ref 26.0–34.0)
MCHC: 30.9 g/dL (ref 30.0–36.0)
MCV: 94 fL (ref 80.0–100.0)
Platelets: 171 10*3/uL (ref 150–400)
RBC: 2.51 MIL/uL — ABNORMAL LOW (ref 3.87–5.11)
RDW: 15.3 % (ref 11.5–15.5)
WBC: 7.2 10*3/uL (ref 4.0–10.5)
nRBC: 0 % (ref 0.0–0.2)

## 2020-07-01 LAB — BASIC METABOLIC PANEL
Anion gap: 7 (ref 5–15)
BUN: 50 mg/dL — ABNORMAL HIGH (ref 8–23)
CO2: 31 mmol/L (ref 22–32)
Calcium: 6.6 mg/dL — ABNORMAL LOW (ref 8.9–10.3)
Chloride: 99 mmol/L (ref 98–111)
Creatinine, Ser: 1.04 mg/dL — ABNORMAL HIGH (ref 0.44–1.00)
GFR, Estimated: >60 mL/min (ref >60)
Glucose, Bld: 131 mg/dL — ABNORMAL HIGH (ref 70–99)
Potassium: 3.8 mmol/L (ref 3.5–5.1)
Sodium: 137 mmol/L (ref 135–145)

## 2020-07-01 MED ORDER — POTASSIUM CHLORIDE 20 MEQ PO PACK
40.0000 meq | PACK | Freq: Once | ORAL | Status: AC
  Administered 2020-07-01: 40 meq
  Filled 2020-07-01: qty 2

## 2020-07-01 MED ORDER — ETOMIDATE 2 MG/ML IV SOLN
40.0000 mg | Freq: Once | INTRAVENOUS | Status: AC
  Administered 2020-07-02: 20 mg via INTRAVENOUS
  Filled 2020-07-01: qty 20

## 2020-07-01 MED ORDER — MIDAZOLAM HCL 2 MG/2ML IJ SOLN
5.0000 mg | Freq: Once | INTRAMUSCULAR | Status: AC
  Administered 2020-07-02: 2 mg via INTRAVENOUS
  Filled 2020-07-01: qty 6

## 2020-07-01 MED ORDER — MIDAZOLAM HCL 2 MG/2ML IJ SOLN: 5.0000 mg | Freq: Once | INTRAMUSCULAR | Status: DC

## 2020-07-01 MED ORDER — VECURONIUM BROMIDE 10 MG IV SOLR
10.0000 mg | Freq: Once | INTRAVENOUS | Status: AC
  Administered 2020-07-02: 10 mg via INTRAVENOUS
  Filled 2020-07-01: qty 10

## 2020-07-01 MED ORDER — FENTANYL CITRATE (PF) 100 MCG/2ML IJ SOLN
200.0000 ug | Freq: Once | INTRAMUSCULAR | Status: AC
  Administered 2020-07-02: 100 ug via INTRAVENOUS
  Filled 2020-07-01: qty 4

## 2020-07-01 MED ORDER — FUROSEMIDE 10 MG/ML IJ SOLN
40.0000 mg | Freq: Once | INTRAMUSCULAR | Status: AC
  Administered 2020-07-01: 40 mg via INTRAVENOUS
  Filled 2020-07-01: qty 4

## 2020-07-01 MED ORDER — ENOXAPARIN SODIUM 40 MG/0.4ML ~~LOC~~ SOLN
40.0000 mg | SUBCUTANEOUS | Status: DC
  Administered 2020-07-03 – 2020-07-09 (×7): 40 mg via SUBCUTANEOUS
  Filled 2020-07-01 (×7): qty 0.4

## 2020-07-01 MED ORDER — SODIUM CHLORIDE 0.9 % IV SOLN
2.0000 g | Freq: Three times a day (TID) | INTRAVENOUS | Status: AC
  Administered 2020-07-01 – 2020-07-05 (×13): 2 g via INTRAVENOUS
  Filled 2020-07-01 (×13): qty 2

## 2020-07-01 NOTE — Progress Notes (Signed)
NAMEDeshanna [name], MRN:  [id number], DOB:  07/17/1955, LOS: 31 ADMISSION DATE:  05/31/2020, CONSULTATION DATE: 06/15/2020 REFERRING MD: Rachel [name], CHIEF COMPLAINT: Acute respiratory failure secondary to Covid with subsequent possible acquired pneumonia   Brief History:  65 yo female former smoker presented to [facility] on 05/31/20 with one week of progressive dyspnea from upper respiratory tract infection.  Found to have severe hypoxia with SpO2 50%.  She hadn't received COVID 19 vaccination.  She was treated for COVID 19 pneumonia with steroids, remdesivir and tocilizumab on 2/19.  She was started on ABx for possible HCAP.  She ultimately required intubation and transferred to [name].  Pneumococcal urine antigen positive.  Past Medical History:  GERD, HLD, Nephrolithiasis  Significant [name] Events:  2/18 Admit to [facility], start steroids and remdesivir, treated with tocilizumab 3/03 increasing O2 requirements, started on Bipap 3/05 intubated, transferred to [name] 3/06 Lt pneumothorax >> pig tail catheter placed 3/07 hypoxia/hypotension/vent dys-synchrony >> improved after chest tube flushed 3/8 d/c steroids, start HCO3 gtt 3/10 d/c HCO3 gtt, off pressors 3/11-3/13 Diuresis 3/19 persistent fever, change ABx, transfuse 1 unit PRBC  Consults:    Procedures:  ETT 3/05 >> Rt radial a line 3/06 >> Lt IJ CVL 3/06 >> Lt pig tail catheter 3/06 >>  Significant Diagnostic Tests:   CT chest 2/18 >> patchy b/l ASD  Doppler legs b/l 2/21 >> no DVT, 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  No clear air-fluid levels.  Left side pigtail in place without pneumothorax  Micro Data:  COVID 2/18 >> positive MRSA PCR 3/05 >> negative Blood 3/05 >> negative Pneumococcal urine Ag 3/05 >> positive Sputum 3/17 >> Rare Candida albicans Sputum 3/19 >>    Antimicrobials:  Vancomycin 3/05 >> 3/07 Zosyn 3/05 Cefepime 3/05 >> 3/07 Rocephin 3/07 >> 3/12, 3/16>>> 3/19 Linezolid 3/19 >> Elita Quick 3/19 >>   Interim History / Subjective:  Intermittently still spiking fever, but less frequently.    Objective   Blood pressure 108/71, pulse 95, temperature 99.86 F (37.7 C), resp. rate (!) 24, height 5\' 4"  (1.626 m), weight 80.3 kg, SpO2 95 %.    Vent Mode: PRVC FiO2 (%):  [45 %-60 %] 60 % Set Rate:  [26 bmp-32 bmp] 26 bmp Vt Set:  [380 mL-420 mL] 420 mL PEEP:  [8 cmH20-10 cmH20] 10 cmH20 Plateau Pressure:  [20 cmH20-38 cmH20] 30 cmH20   Intake/Output Summary (Last 24 hours) at 07/01/2020 07/03/2020 Last data filed at 07/01/2020 07/03/2020 Gross per 24 hour  Intake 5955.39 ml  Output 2785 ml  Net 3170.39 ml   Filed Weights   06/29/20 0426 06/30/20 0435 07/01/20 0116  Weight: 80.1 kg 80.1 kg 80.3 kg   Physical Exam:  General - more alert Eyes - pupils reactive ENT - ETT in place with crusting around lip Cardiac - regular rate/rhythm, no murmur Chest - bronchial BS b/l, no air leak from Lt chest tube Abdomen - soft, non tender, + bowel sounds Extremities - 1+ edema Skin - no rashes Neuro - RASS 0, follows commands, moves extremities.  Resolved [name] Problem list   Non gap metabolic acidosis, Elevated LFTs, Hypotension from meds, Septic shock, AKI from ATN 2nd to sepsis, Thrombocytopenia from sepsis, Hypernatremia  Assessment & Plan:   Acute hypoxic/hypercapnic respiratory failure from COVID 19 pneumonia with ARDS complicated by Pneumococcal bacterial pneumonia, left pneumothorax. - had progressive  pneumatoceles on CT chest from 3/15 and persistent fever from 3/13 to 3/16 - restarted ABx 3/16; changed to linezolid and fortaz on 3/19; fever curve and O2 needs improved some since - family is agreeable to trach; plan for later this week - goal SpO2 > 88% - f/u CXR - lasix 40 mg IV x one 3/21  Lt pneumothorax. - changed chest tube to  water seal on 3/20 - keep chest tube in place until after trach, and then would likely be able to d/c - flush chest tube qshift  Acute metabolic encephalopathy 2nd to hypoxia/hypercapnia, uremic encepahalopathy - RASS goal 0 to -1  Anemia of critical illness.. - f/u CBC - transfuse for Hb < 7 or significant bleeding  Hyperglycemia. - SSI with lantus 10 units daily  Hx of HLD. - continue simvastatin  Constipation. - Bowel regimen as ordered  Goals of care. - updated pt's husband on 3/18; he is still hopeful she can recover, and would like to continue all aggressive measures  Best practice (evaluated daily)  Diet: tube feeds DVT prophylaxis: lovenox GI prophylaxis: protonix Mobility: Bedrest Disposition: ICU  Code Status: Full  Labs    CMP Latest Ref Rng & Units 07/01/2020 06/30/2020 06/29/2020  Glucose 70 - 99 mg/dL 194(R) 740(C) 144(Y)  BUN 8 - 23 mg/dL 18(H) 63(J) 49(F)  Creatinine 0.44 - 1.00 mg/dL 0.26(V) 7.85(Y) 8.50(Y)  Sodium 135 - 145 mmol/L 137 141 148(H)  Potassium 3.5 - 5.1 mmol/L 3.8 3.2(L) 3.2(L)  Chloride 98 - 111 mmol/L 99 102 106  CO2 22 - 32 mmol/L 31 31 35(H)  Calcium 8.9 - 10.3 mg/dL 6.6(L) 6.5(L) 6.9(L)  Total Protein 6.5 - 8.1 g/dL - - -  Total Bilirubin 0.3 - 1.2 mg/dL - - -  Alkaline Phos 38 - 126 U/L - - -  AST 15 - 41 U/L - - -  ALT 0 - 44 U/L - - -    CBC Latest Ref Rng & Units 07/01/2020 06/30/2020 06/29/2020  WBC 4.0 - 10.5 K/uL 7.2 8.2 7.7  Hemoglobin 12.0 - 15.0 g/dL 7.3(L) 7.8(L) 6.5(LL)  Hematocrit 36.0 - 46.0 % 23.6(L) 25.3(L) 21.5(L)  Platelets 150 - 400 K/uL 171 199 195    ABG    Component Value Date/Time   PHART 7.404 06/26/2020 0908   PCO2ART 64.7 (H) 06/26/2020 0908   PO2ART 57 (L) 06/26/2020 0908   HCO3 40.1 (H) 06/26/2020 0908   TCO2 42 (H) 06/26/2020 0908   ACIDBASEDEF 2.9 (H) 06/19/2020 0332   O2SAT 86.0 06/26/2020 0908    CBG (last 3)  Recent Labs    06/30/20 2306 07/01/20 0319 07/01/20 0740  GLUCAP 167*  138* 128*    Critical care time: 33 minutes  Coralyn Helling, MD [place] [facility] Pager - [phone] 07/01/2020, 8:14 AM

## 2020-07-01 NOTE — Progress Notes (Signed)
Pharmacy Antibiotic Note  Rachel [name] is a 65 y.o. female admitted on 05/31/2020 with persistent fever and concern for HAP.  Pharmacy has been consulted for Ceftazidime dosing.  Also on Zyvox.  Patient has been on Ceftriaxone for concern of Streptococcus pneumoniae with pseudoceles on CXR needing longer course. Despite lengthening Ceftriaxone, patient has continued to spike fevers and tachycardic.  Renal function improving, now afebrile, WBC WNL.  Plan: Increase Fortaz to 2gm IV Q8H Zyvox 600mg  IV Q12H Monitor renal fxn, clinical progress, de-escalation plans  Height: 5\' 4"  (162.6 cm) Weight: 80.3 kg (177 lb 0.5 oz) IBW/kg (Calculated) : 54.7  Temp (24hrs), Avg:99.8 F (37.7 C), Min:98.96 F (37.2 C), Max:101.3 F (38.5 C)  Recent Labs  Lab 06/27/20 0500 06/28/20 0546 06/29/20 0416 06/30/20 0015 07/01/20 0422  WBC 9.4 9.3 7.7 8.2 7.2  CREATININE 1.93* 1.65* 1.44* 1.27* 1.04*    Estimated Creatinine Clearance: 56.7 mL/min (A) (by C-G formula based on SCr of 1.04 mg/dL (H)).    No Known Allergies  Remdesivir 2/18 >> 2/22  Vanc 3/5>>3/7 Zosyn 3/5>>3/5 Cefep 3/5>>3/7 CTX  3/7>> 3/12, 3/16>>3/19 5/12 3/19 >> Zyvox 3/19 >>  2/18 SARS2-CV positive 3/5BCx: negative 3/5UCx: negative 3/5MRSA PCR: Negative  3/5 Strep pneumo Ur Ag -  positive 3/17 TA - C.albicans 3/19 TA - reincubated   Suzanna Zahn D. 4/19, PharmD, BCPS, BCCCP 07/01/2020, 9:49 AM

## 2020-07-02 ENCOUNTER — Inpatient Hospital Stay (HOSPITAL_COMMUNITY): Payer: BLUE CROSS/BLUE SHIELD

## 2020-07-02 DIAGNOSIS — J189 Pneumonia, unspecified organism: Secondary | ICD-10-CM | POA: Diagnosis not present

## 2020-07-02 DIAGNOSIS — J9601 Acute respiratory failure with hypoxia: Secondary | ICD-10-CM | POA: Diagnosis not present

## 2020-07-02 DIAGNOSIS — U071 COVID-19: Secondary | ICD-10-CM | POA: Diagnosis not present

## 2020-07-02 DIAGNOSIS — J1282 Pneumonia due to coronavirus disease 2019: Secondary | ICD-10-CM | POA: Diagnosis not present

## 2020-07-02 LAB — CBC
HCT: 22.9 % — ABNORMAL LOW (ref 36.0–46.0)
Hemoglobin: 7.4 g/dL — ABNORMAL LOW (ref 12.0–15.0)
MCH: 29.6 pg (ref 26.0–34.0)
MCHC: 32.3 g/dL (ref 30.0–36.0)
MCV: 91.6 fL (ref 80.0–100.0)
Platelets: 159 10*3/uL (ref 150–400)
RBC: 2.5 MIL/uL — ABNORMAL LOW (ref 3.87–5.11)
RDW: 15.4 % (ref 11.5–15.5)
WBC: 7.5 10*3/uL (ref 4.0–10.5)
nRBC: 0 % (ref 0.0–0.2)

## 2020-07-02 LAB — BASIC METABOLIC PANEL
Anion gap: 7 (ref 5–15)
BUN: 43 mg/dL — ABNORMAL HIGH (ref 8–23)
CO2: 32 mmol/L (ref 22–32)
Calcium: 6.8 mg/dL — ABNORMAL LOW (ref 8.9–10.3)
Chloride: 98 mmol/L (ref 98–111)
Creatinine, Ser: 0.92 mg/dL (ref 0.44–1.00)
GFR, Estimated: >60 mL/min (ref >60)
Glucose, Bld: 79 mg/dL (ref 70–99)
Potassium: 3.7 mmol/L (ref 3.5–5.1)
Sodium: 137 mmol/L (ref 135–145)

## 2020-07-02 LAB — PROTIME-INR
INR: 1.2 (ref 0.8–1.2)
Prothrombin Time: 14.7 seconds (ref 11.4–15.2)

## 2020-07-02 LAB — GLUCOSE, CAPILLARY
Glucose-Capillary: 72 mg/dL (ref 70–99)
Glucose-Capillary: 79 mg/dL (ref 70–99)
Glucose-Capillary: 121 mg/dL — ABNORMAL HIGH (ref 70–99)
Glucose-Capillary: 120 mg/dL — ABNORMAL HIGH (ref 70–99)
Glucose-Capillary: 68 mg/dL — ABNORMAL LOW (ref 70–99)
Glucose-Capillary: 68 mg/dL — ABNORMAL LOW (ref 70–99)
Glucose-Capillary: 70 mg/dL (ref 70–99)

## 2020-07-02 MED ORDER — DEXTROSE 50 % IV SOLN
INTRAVENOUS | Status: AC
  Administered 2020-07-02: 12.5 g via INTRAVENOUS
  Filled 2020-07-02: qty 50

## 2020-07-02 MED ORDER — ETOMIDATE 2 MG/ML IV SOLN: 20.0000 mg | INTRAVENOUS | Status: DC

## 2020-07-02 MED ORDER — NUTRISOURCE FIBER PO PACK
1.0000 | PACK | Freq: Two times a day (BID) | ORAL | Status: DC
  Administered 2020-07-03 – 2020-07-09 (×12): 1
  Filled 2020-07-02 (×15): qty 1

## 2020-07-02 MED ORDER — DEXTROSE 50 % IV SOLN: 12.5000 g | INTRAVENOUS | Status: AC

## 2020-07-02 MED ORDER — VECURONIUM BROMIDE 10 MG IV SOLR: 10.0000 mg | INTRAVENOUS | Status: DC

## 2020-07-02 MED ORDER — FENTANYL CITRATE (PF) 100 MCG/2ML IJ SOLN: 100.0000 ug | INTRAMUSCULAR | Status: DC

## 2020-07-02 MED ORDER — MIDAZOLAM HCL 2 MG/2ML IJ SOLN: 2.0000 mg | Freq: Once | INTRAMUSCULAR | Status: DC

## 2020-07-02 MED ORDER — DEXTROSE IN LACTATED RINGERS 5 % IV SOLN: INTRAVENOUS | Status: DC

## 2020-07-02 MED ORDER — FUROSEMIDE 10 MG/ML IJ SOLN
40.0000 mg | Freq: Once | INTRAMUSCULAR | Status: AC
  Administered 2020-07-02: 40 mg via INTRAVENOUS
  Filled 2020-07-02: qty 4

## 2020-07-02 NOTE — Progress Notes (Signed)
eLink Physician-Brief Progress Note Patient Name: Rachel [name] DOB: December 06, 1955 MRN: [id number]   Date of Service  07/02/2020  HPI/Events of Note  Notified of hypoglycemia No feeding tube available at this time S/p trach  eICU Interventions  Start D5 LR at 75 cc/hr unit feeding tube can be placed     Intervention Category Major Interventions: Other:  Darl Pikes 07/02/2020, 8:36 PM

## 2020-07-02 NOTE — Progress Notes (Signed)
SLP Cancellation Note  Patient Details Name: Rachel [name] MRN: [id number] DOB: 11-Mar-1956   Cancelled treatment:. SLP orders received for PMV and swallow evals.  Pt just received trach today.  Will follow along for readiness for our intervention.  Marchelle Folks L. Samson Frederic, MA CCC/SLP Acute Rehabilitation Services Office number [phone] Pager [phone]              Carolan Shiver 07/02/2020, 12:17 PM

## 2020-07-02 NOTE — Progress Notes (Signed)
Hypoglycemic Event  CBG: 68  Treatment: D5 69ml IV  Symptoms: none  Follow-up CBG: Time:1622 CBG Result:120  Possible Reasons for Event: NPO  Comments/MD notified:Hypoglycemic protocol implemented    Rachel [name], [place]

## 2020-07-02 NOTE — Progress Notes (Signed)
NAMEAzha [name], MRN:  [id number], DOB:  1955/08/21, LOS: 32 ADMISSION DATE:  05/31/2020, CONSULTATION DATE: 06/15/2020 REFERRING MD: [facility], CHIEF COMPLAINT: Acute respiratory failure secondary to Covid with subsequent possible acquired pneumonia   Brief History:  65 yo female former smoker presented to [facility] on 05/31/20 with one week of progressive dyspnea from upper respiratory tract infection.  Found to have severe hypoxia with SpO2 50%.  She hadn't received COVID 19 vaccination.  She was treated for COVID 19 pneumonia with steroids, remdesivir and tocilizumab on 2/19.  She was started on ABx for possible HCAP.  She ultimately required intubation and transferred to [facility].  Pneumococcal urine antigen positive.  Past Medical History:  GERD, HLD, Nephrolithiasis  Significant Hospital Events:  2/18 Admit to [facility], start steroids and remdesivir, treated with tocilizumab 3/03 increasing O2 requirements, started on Bipap 3/05 intubated, transferred to [facility] 3/06 Lt pneumothorax >> pig tail catheter placed 3/07 hypoxia/hypotension/vent dys-synchrony >> improved after chest tube flushed 3/8 d/c steroids, start HCO3 gtt 3/10 d/c HCO3 gtt, off pressors 3/11-3/13 Diuresis 3/19 persistent fever, change ABx, transfuse 1 unit PRBC 3/22 tracheostomy  Consults:    Procedures:  ETT 3/05 >> Lt IJ CVL 3/06 >> Lt pig tail catheter 3/06 >>  Significant Diagnostic Tests:   CT chest 2/18 >> patchy b/l ASD  Doppler legs b/l 2/21 >> no DVT, 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  No clear air-fluid levels.  Left side pigtail in place without pneumothorax  Micro Data:  COVID 2/18 >> positive MRSA PCR 3/05 >> negative Blood 3/05 >> negative Pneumococcal urine Ag 3/05 >> positive Sputum 3/17 >> Rare Candida albicans Sputum 3/19 >> Rare Candida  albicans  Antimicrobials:  Vancomycin 3/05 >> 3/07 Zosyn 3/05 Cefepime 3/05 >> 3/07 Rocephin 3/07 >> 3/12, 3/16>>> 3/19 Linezolid 3/19 >> Elita Quick 3/19 >>   Interim History / Subjective:  Fever curve better.  More alert.  FiO2 50% with PEEP 10.  No air leak from Lt chest tube.  Objective   Blood pressure 133/76, pulse 97, temperature 99.86 F (37.7 C), resp. rate 19, height 5\' 4"  (1.626 m), weight 80.2 kg, SpO2 96 %.    Vent Mode: PRVC FiO2 (%):  [40 %-50 %] 50 % Set Rate:  [26 bmp] 26 bmp Vt Set:  [420 mL] 420 mL PEEP:  [10 cmH20] 10 cmH20 Plateau Pressure:  [10 cmH20-31 cmH20] 27 cmH20   Intake/Output Summary (Last 24 hours) at 07/02/2020 0936 Last data filed at 07/02/2020 0800 Gross per 24 hour  Intake 4286.28 ml  Output 3250 ml  Net 1036.28 ml   Filed Weights   06/30/20 0435 07/01/20 0116 07/02/20 0409  Weight: 80.1 kg 80.3 kg 80.2 kg   Physical Exam:  General - alert Eyes - pupils reactive ENT - ETT in place Cardiac - regular rate/rhythm, no murmur Chest - bronchial breath sounds b/l Abdomen - soft, non tender, + bowel sounds Extremities - 2+ edema Skin - no rashes Neuro - normal strength, moves extremities, follows commands   Resolved Hospital Problem list   Non gap metabolic acidosis, Elevated LFTs, Hypotension from meds, Septic shock, AKI from ATN 2nd to sepsis, Thrombocytopenia from sepsis, Hypernatremia, Constipation  Assessment & Plan:   Acute hypoxic/hypercapnic respiratory failure from COVID 19 pneumonia with ARDS complicated by Pneumococcal bacterial pneumonia, left pneumothorax. - had progressive pneumatoceles on CT chest  from 3/15 and persistent fever from 3/13 to 3/16 - restarted ABx 3/16; changed to linezolid and fortaz on 3/19; fever curve and O2 needs improved some since; would continue fortaz and zyvox for at least 7 days - for trach 3/22 - goal SpO2 > 88% - f/u CXR after trach - lasix 40 mg IV x one on 3/22  Lt pneumothorax. - changed to  water seal 3/20 - keep chest tube in place until after trach, and then would likely be able to d/c - flush chest tube qshift  Acute metabolic encephalopathy 2nd to hypoxia/hypercapnia, uremic encepahalopathy - RASS 0 to -1  Anemia of critical illness.. - f/u CBC - transfuse for Hb < 7 or significant bleeding  Hyperglycemia. - SSI  - hold lantus on 3/22 in anticipation of tracheostomy; can resume once she gets cortrak and can resume tube feeds  Hx of HLD. 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)  Diet: tube feeds DVT prophylaxis: lovenox held on 3/22 >> can resume after trach GI prophylaxis: protonix Mobility: Bedrest Disposition: ICU  Code Status: Full  Updated pt's husband at bedside  Labs    CMP Latest Ref Rng & Units 07/02/2020 07/01/2020 06/30/2020  Glucose 70 - 99 mg/dL 79 017(C) 944(H)  BUN 8 - 23 mg/dL 67(R) 91(M) 38(G)  Creatinine 0.44 - 1.00 mg/dL 6.65 9.93(T) 7.01(X)  Sodium 135 - 145 mmol/L 137 137 141  Potassium 3.5 - 5.1 mmol/L 3.7 3.8 3.2(L)  Chloride 98 - 111 mmol/L 98 99 102  CO2 22 - 32 mmol/L 32 31 31  Calcium 8.9 - 10.3 mg/dL 7.9(T) 6.6(L) 6.5(L)  Total Protein 6.5 - 8.1 g/dL - - -  Total Bilirubin 0.3 - 1.2 mg/dL - - -  Alkaline Phos 38 - 126 U/L - - -  AST 15 - 41 U/L - - -  ALT 0 - 44 U/L - - -    CBC Latest Ref Rng & Units 07/02/2020 07/01/2020 06/30/2020  WBC 4.0 - 10.5 K/uL 7.5 7.2 8.2  Hemoglobin 12.0 - 15.0 g/dL 7.4(L) 7.3(L) 7.8(L)  Hematocrit 36.0 - 46.0 % 22.9(L) 23.6(L) 25.3(L)  Platelets 150 - 400 K/uL 159 171 199    ABG    Component Value Date/Time   PHART 7.404 06/26/2020 0908   PCO2ART 64.7 (H) 06/26/2020 0908   PO2ART 57 (L) 06/26/2020 0908   HCO3 40.1 (H) 06/26/2020 0908   TCO2 42 (H) 06/26/2020 0908   ACIDBASEDEF 2.9 (H) 06/19/2020 0332   O2SAT 86.0 06/26/2020 0908    CBG (last 3)  Recent Labs    07/01/20 2349 07/02/20 0339 07/02/20 0723  GLUCAP 196* 72 79    Signature:   Coralyn Helling, MD [place] [facility]/Critical [facility] Pager - [phone] 07/02/2020, 9:36 AM

## 2020-07-02 NOTE — Procedures (Signed)
Percutaneous Tracheostomy Procedure Note   Elba Schaber  [id number]  [birth year]  Date:07/02/20  Time:11:46 AM   Provider Performing:Ranette Luckadoo Erby Pian MD supervising student Dr. Hardie Lora  Procedure: Percutaneous Tracheostomy with Bronchoscopic Guidance ([id number])  Indication(s) Persistent respiratory failure  Consent Risks of the procedure as well as the alternatives and risks of each were explained to the patient and/or caregiver.  Consent for the procedure was obtained.  Anesthesia Etomidate, Versed, Fentanyl, Vecuronium   Time Out Verified patient identification, verified procedure, site/side was marked, verified correct patient position, special equipment/implants available, medications/allergies/relevant history reviewed, required imaging and test results available.   Sterile Technique Maximal sterile technique including sterile barrier drape, hand hygiene, sterile gown, sterile gloves, mask, hair covering.    Procedure Description Appropriate anatomy identified by palpation.  Patient's neck prepped and draped in sterile fashion.  1% lidocaine with epinephrine was used to anesthetize skin overlying neck.  1.5cm incision made and blunt dissection performed until tracheal rings could be easily palpated.   Then a size 6-0 Shiley tracheostomy was placed under bronchoscopic visualization using usual Seldinger technique and serial dilation.   Bronchoscope confirmed placement above the carina.  Tracheostomy was sutured in place with adhesive pad to protect skin under pressure.    Patient connected to ventilator.   Complications/Tolerance None; patient tolerated the procedure well. Chest X-ray is ordered to confirm no post-procedural complication.   EBL Minimal   Specimen(s) None

## 2020-07-02 NOTE — TOC Progression Note (Signed)
Transition of Care [facility]) - Progression Note    Patient Details  Name: Rachel [name] MRN: [id number] Date of Birth: 08-12-1955  Transition of Care [facility]) CM/SW Contact  Bess Kinds, RN Phone Number: [phone] 07/02/2020, 3:46 PM  Clinical Narrative:     Spoke with patient and spouse at bedside this afternoon. Discussed LTAC options. Spouse not wanting to think about today and wanting to wait until MD says it is time to make decisions. TOC following for transition needs.   Expected Discharge Plan: Long Term Acute Care (LTAC) Barriers to Discharge: Continued Medical Work up  Expected Discharge Plan and Services Expected Discharge Plan: Long Term Acute Care (LTAC) In-house Referral: Clinical Social Work Discharge Planning Services: CM Consult Post Acute Care Choice: Long Term Acute Care (LTAC) Living arrangements for the past 2 months: Single Family Home                 DME Arranged: N/A DME Agency: NA Date DME Agency Contacted: 06/11/20 Time DME Agency Contacted: 1037 Representative spoke with at DME Agency: [employer]             Social Determinants of Health (SDOH) Interventions    Readmission Risk Interventions No flowsheet data found.

## 2020-07-02 NOTE — TOC Initial Note (Signed)
Transition of Care [facility]) - Initial/Assessment Note    Patient Details  Name: Rachel [name] MRN: [id number] Date of Birth: Aug 20, 1955  Transition of Care [facility]) CM/SW Contact:    Bess Kinds, RN Phone Number: [phone] 07/02/2020, 10:38 AM  Clinical Narrative:                  Spoke with patient and spouse, Dorinda Hill, at the bedside to discuss transition planning. Plan is for trach today. Discussed long term acute care hospital for post acute care. Offered choice of local LTACs - spouse agreeable to talking to [facility]. Requested liaisons from both [facility] and [facility] reach out to spouse to discuss their facility.   PTA admission patient was living at home with her husband. Independent. No DME. 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 Her goal is to get back home.   TOC following for transition needs.   Expected Discharge Plan: Long Term Acute Care (LTAC) Barriers to Discharge: Continued Medical Work up   Patient Goals and CMS Choice Patient states their goals for this hospitalization and ongoing recovery are:: ultimately get back home with husband CMS Medicare.gov Compare Post Acute Care list provided to:: Patient Choice offered to / list presented to : [facility]: Lambert Rhonda W  Expected Discharge Plan and Services Expected Discharge Plan: Long Term Acute Care (LTAC) In-house Referral: Clinical Social Work Discharge Planning Services: CM Consult Post Acute Care Choice: Long Term Acute Care (LTAC) Living arrangements for the past 2 months: Single Family Home                 DME Arranged: N/A DME Agency: NA Date DME Agency Contacted: 06/11/20 Time DME Agency Contacted: 1037 Representative spoke with at DME Agency: [employer] Distance            Prior Living Arrangements/Services Living arrangements for the past 2 months: Single Family Home Lives with:: Self,Spouse Patient language and need for interpreter reviewed:: Yes Do you feel safe going back to the place where you live?: Yes      Need for  Family Participation in Patient Care: Yes (Comment) Care giver support system in place?: Yes (comment)   Criminal Activity/Legal Involvement Pertinent to Current Situation/Hospitalization: No - Comment as needed  Activities of Daily Living Home Assistive Devices/Equipment: None ADL Screening (condition at time of admission) Patient's cognitive ability adequate to safely complete daily activities?: Yes Is the patient deaf or have difficulty hearing?: No Does the patient have difficulty seeing, even when wearing glasses/contacts?: No Does the patient have difficulty concentrating, remembering, or making decisions?: No Patient able to express need for assistance with ADLs?: No Does the patient have difficulty dressing or bathing?: No Independently performs ADLs?: Yes (appropriate for developmental age) Does the patient have difficulty walking or climbing stairs?: No Weakness of Legs: None Weakness of Arms/Hands: None  Permission Sought/Granted Permission sought to share information with : Family Supports Permission granted to share information with : Yes, Verbal Permission Granted  Share Information with NAME: Morissa Obeirne     Permission granted to share info w Relationship: spouse  Permission granted to share info w Contact Information: [phone]  Emotional Assessment Appearance:: Appears stated age Attitude/Demeanor/Rapport: Engaged Affect (typically observed): Accepting Orientation: : Oriented to Self,Oriented to  Time,Oriented to Place,Oriented to Situation Alcohol / Substance Use: Not Applicable Psych Involvement: No (comment)  Admission diagnosis:  Hypoxia [R09.02] Pneumonia due to COVID-19 virus [U07.1, J12.82] COVID-19 [U07.1] Respiratory failure (HCC) [J96.90] Patient Active Problem List   Diagnosis Date Noted  . Respiratory  failure (HCC) 06/15/2020  . Goals of care, counseling/discussion 06/06/2020  . DNR (do not resuscitate) 06/06/2020  . Hypoxia   . COVID-19    . Positive D dimer   . Acute respiratory distress   . Pneumonia due to COVID-19 virus 05/31/2020  . Acute respiratory failure with hypoxia (HCC) 05/31/2020  . GERD (gastroesophageal reflux disease) 05/31/2020  . Nephrolithiasis 05/31/2020  . Hypokalemia 05/31/2020  . Hyponatremia 05/31/2020  . Special screening for malignant neoplasms, colon    PCP:  Zhou-Talbert, Ralene Bathe, MD Pharmacy:   [facility] [address] [place] - [address] [place] [address] Phone: [phone] Fax: [phone]     Social Determinants of Health (SDOH) Interventions    Readmission Risk Interventions No flowsheet data found.

## 2020-07-03 ENCOUNTER — Inpatient Hospital Stay (HOSPITAL_COMMUNITY): Payer: BLUE CROSS/BLUE SHIELD

## 2020-07-03 DIAGNOSIS — U071 COVID-19: Secondary | ICD-10-CM | POA: Diagnosis not present

## 2020-07-03 DIAGNOSIS — J1282 Pneumonia due to coronavirus disease 2019: Secondary | ICD-10-CM | POA: Diagnosis not present

## 2020-07-03 DIAGNOSIS — J9601 Acute respiratory failure with hypoxia: Secondary | ICD-10-CM | POA: Diagnosis not present

## 2020-07-03 LAB — CBC
HCT: 26.1 % — ABNORMAL LOW (ref 36.0–46.0)
Hemoglobin: 8 g/dL — ABNORMAL LOW (ref 12.0–15.0)
MCH: 28.6 pg (ref 26.0–34.0)
MCHC: 30.7 g/dL (ref 30.0–36.0)
MCV: 93.2 fL (ref 80.0–100.0)
Platelets: 194 10*3/uL (ref 150–400)
RBC: 2.8 MIL/uL — ABNORMAL LOW (ref 3.87–5.11)
RDW: 15.6 % — ABNORMAL HIGH (ref 11.5–15.5)
WBC: 9.3 10*3/uL (ref 4.0–10.5)
nRBC: 0 % (ref 0.0–0.2)

## 2020-07-03 LAB — GLUCOSE, CAPILLARY
Glucose-Capillary: 107 mg/dL — ABNORMAL HIGH (ref 70–99)
Glucose-Capillary: 144 mg/dL — ABNORMAL HIGH (ref 70–99)
Glucose-Capillary: 155 mg/dL — ABNORMAL HIGH (ref 70–99)
Glucose-Capillary: 158 mg/dL — ABNORMAL HIGH (ref 70–99)
Glucose-Capillary: 212 mg/dL — ABNORMAL HIGH (ref 70–99)
Glucose-Capillary: 95 mg/dL (ref 70–99)
Glucose-Capillary: 97 mg/dL (ref 70–99)

## 2020-07-03 LAB — BASIC METABOLIC PANEL
Anion gap: 7 (ref 5–15)
BUN: 35 mg/dL — ABNORMAL HIGH (ref 8–23)
CO2: 32 mmol/L (ref 22–32)
Calcium: 6.9 mg/dL — ABNORMAL LOW (ref 8.9–10.3)
Chloride: 99 mmol/L (ref 98–111)
Creatinine, Ser: 0.88 mg/dL (ref 0.44–1.00)
GFR, Estimated: >60 mL/min (ref >60)
Glucose, Bld: 111 mg/dL — ABNORMAL HIGH (ref 70–99)
Potassium: 3.2 mmol/L — ABNORMAL LOW (ref 3.5–5.1)
Sodium: 138 mmol/L (ref 135–145)

## 2020-07-03 MED ORDER — FUROSEMIDE 10 MG/ML IJ SOLN
40.0000 mg | Freq: Two times a day (BID) | INTRAMUSCULAR | Status: AC
Start: 2020-07-03 — End: 2020-07-03
  Administered 2020-07-03 (×2): 40 mg via INTRAVENOUS
  Filled 2020-07-03 (×2): qty 4

## 2020-07-03 MED ORDER — OXYCODONE HCL 5 MG/5ML PO SOLN
5.0000 mg | Freq: Four times a day (QID) | ORAL | Status: DC | PRN
  Administered 2020-07-03 – 2020-07-09 (×13): 5 mg
  Filled 2020-07-03 (×14): qty 5

## 2020-07-03 MED ORDER — PROSOURCE TF PO LIQD
45.0000 mL | Freq: Two times a day (BID) | ORAL | Status: DC
  Administered 2020-07-03 – 2020-07-09 (×12): 45 mL
  Filled 2020-07-03 (×12): qty 45

## 2020-07-03 MED ORDER — POTASSIUM CHLORIDE 10 MEQ/50ML IV SOLN
10.0000 meq | INTRAVENOUS | Status: AC
  Administered 2020-07-03 (×4): 10 meq via INTRAVENOUS
  Filled 2020-07-03 (×4): qty 50

## 2020-07-03 NOTE — Progress Notes (Signed)
SLP Cancellation Note  Patient Details Name: Rachel [name] MRN: [id number] DOB: [birth year]   Cancelled treatment:       Reason Eval/Treat Not Completed: Medical issues which prohibited therapy (Pt remains on the vent at this time. SLP will follow up.)  Shanika I. Vear Clock, MS, CCC-SLP Acute Rehabilitation Services Office number [phone] Pager [phone]  Scheryl Marten 07/03/2020, 8:56 AM

## 2020-07-03 NOTE — Progress Notes (Signed)
[facility] ADULT ICU REPLACEMENT PROTOCOL   The patient does apply for the [facility] Adult ICU Electrolyte Replacment Protocol based on the criteria listed below:   1. Is GFR >/= 30 ml/min? Yes.    Patient's GFR today is >60 2. Is SCr </= 2? Yes.   Patient's SCr is 0.88 ml/kg/hr 3. Did SCr increase >/= 0.5 in 24 hours? No. 4. Abnormal electrolyte(s): k 3.2 5. Ordered repletion with: protocol 6. If a panic level lab has been reported, has the CCM MD in charge been notified? No..   Physician:    Markus Daft A 07/03/2020 6:14 AM

## 2020-07-03 NOTE — Progress Notes (Signed)
NAMEEvangelynn [name], MRN:  [id number], DOB:  05-Feb-1956, LOS: 33 ADMISSION DATE:  05/31/2020, CONSULTATION DATE: 06/15/2020 REFERRING MD: [facility], CHIEF COMPLAINT: Acute respiratory failure secondary to Covid with subsequent possible acquired pneumonia   Brief History:  65 yo female former smoker presented to [facility] on 05/31/20 with one week of progressive dyspnea from upper respiratory tract infection.  Found to have severe hypoxia with SpO2 50%.  She hadn't received COVID 19 vaccination.  She was treated for COVID 19 pneumonia with steroids, remdesivir and tocilizumab on 2/19.  She was started on ABx for possible HCAP.  She ultimately required intubation and transferred to [facility].  Pneumococcal urine antigen positive.  Past Medical History:  GERD, HLD, Nephrolithiasis  Significant Hospital Events:  2/18 Admit to [facility], start steroids and remdesivir, treated with tocilizumab 3/03 increasing O2 requirements, started on Bipap 3/05 intubated, transferred to [facility] 3/06 Lt pneumothorax >> pig tail catheter placed 3/07 hypoxia/hypotension/vent dys-synchrony >> improved after chest tube flushed 3/8 d/c steroids, start HCO3 gtt 3/10 d/c HCO3 gtt, off pressors 3/11-3/13 Diuresis 3/19 persistent fever, change ABx, transfuse 1 unit PRBC 3/22 tracheostomy  Consults:    Procedures:  ETT 3/05 >> 3/22 Lt IJ CVL 3/06 >> Lt pig tail catheter 3/06 >> Trach 3/22 >>   Significant Diagnostic Tests:   CT chest 2/18 >> patchy b/l ASD  Doppler legs b/l 2/21 >> no DVT, 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  No clear air-fluid levels.  Left side pigtail in place without pneumothorax  Micro Data:  COVID 2/18 >> positive MRSA PCR 3/05 >> negative Blood 3/05 >> negative Pneumococcal urine Ag 3/05 >> positive Sputum 3/17 >> Rare Candida albicans Sputum  3/19 >> Rare Candida albicans  Antimicrobials:  Vancomycin 3/05 >> 3/07 Zosyn 3/05 Cefepime 3/05 >> 3/07 Rocephin 3/07 >> 3/12, 3/16>>> 3/19 Linezolid 3/19 >> Elita Quick 3/19 >>   Interim History / Subjective:   Trach placed 3/22 Fentanyl 100-150 0.50, PEEP 10 Hyperglycemia after tube feeds discontinued when OG tube removed No fever, hemodynamically stable.  No leukocytosis I/O+ 27.5 L total Hypokalemia 3/23  Objective   Blood pressure (!) 127/95, pulse (!) 102, temperature 100.04 F (37.8 C), resp. rate (!) 22, height 5\' 4"  (1.626 m), weight 84.2 kg, SpO2 95 %.    Vent Mode: PRVC FiO2 (%):  [40 %-50 %] 45 % Set Rate:  [26 bmp] 26 bmp Vt Set:  [420 mL] 420 mL PEEP:  [10 cmH20] 10 cmH20 Plateau Pressure:  [26 cmH20-31 cmH20] 30 cmH20   Intake/Output Summary (Last 24 hours) at 07/03/2020 0805 Last data filed at 07/03/2020 0600 Gross per 24 hour  Intake 2490.43 ml  Output 2550 ml  Net -59.57 ml   Filed Weights   07/01/20 0116 07/02/20 0409 07/03/20 0500  Weight: 80.3 kg 80.2 kg 84.2 kg   Physical Exam:  General -ill-appearing woman, awake Eyes -pupils reactive ENT -trach in place, oropharynx clear, lesion on right side of tongue Cardiac -regular, distant, no murmur Chest -bilateral bronchial breath sounds, no wheeze Abdomen -nondistended, positive bowel sounds Extremities -2+ lower extremity edema Skin -no rash Neuro -wide-awake.  Moving extremities, follows commands, mouthing answers to questions  Chest x-ray 3/23 reviewed by me, heterogeneous bilateral opacities with large right basilar cystic lucency and no evidence of pneumothorax.  Left pigtail in place   Resolved Hospital Problem list  Non gap metabolic acidosis, Elevated LFTs, Hypotension from meds, Septic shock, AKI from ATN 2nd to sepsis, Thrombocytopenia from sepsis, Hypernatremia, Constipation  Assessment & Plan:   Acute hypoxic/hypercapnic respiratory failure from COVID 19 pneumonia with ARDS  complicated by Pneumococcal bacterial pneumonia, left pneumothorax. - had progressive pneumatoceles on CT chest from 3/15 and persistent fever from 3/13 to 3/16 -Ceftriaxone started 3/16, expanded linezolid plus ceftazidime on 3/19 with some improvement in fever curve and respiratory status.  Plan to continue the broad coverage for 7 days total -Standard trach care, pulmonary hygiene -Follow chest x-ray -Dosing diuretics daily, tolerating, repeat Lasix 3/23  Lt pneumothorax. -Okay to continue on waterseal and follow chest x-ray. -Consider DC chest tube given overall stability on waterseal over several days. -Flush chest tube every shift while in place  Acute metabolic encephalopathy 2nd to hypoxia/hypercapnia, uremic encepahalopathy.  Much improved -RASS goal 0  Anemia of critical illness.. -Follow CBC -Transfusion goal hemoglobin 7.0  Hyperglycemia. Now hypoglycemia with interruption of tube feeding 3/22 -Sliding scale insulin as ordered -Plan to get tube feeding running again once core track tube can be placed -Continue to hold Lantus until tube feeds are going -Okay to continue D5 LR until tube feeds are running, decrease rate to 30 cc/h.  Hx of HLD. -Restart simvastatin after we have enteral access   Best practice (evaluated daily)  Diet: tube feeds, plan to restart DVT prophylaxis: Enoxaparin GI prophylaxis: Pantoprazole Mobility: Bedrest Disposition: ICU  Code Status: Full Family: Spoke with the patient's husband by phone 3/23   Labs    CMP Latest Ref Rng & Units 07/03/2020 07/02/2020 07/01/2020  Glucose 70 - 99 mg/dL 423(N) 79 361(W)  BUN 8 - 23 mg/dL 43(X) 54(M) 08(Q)  Creatinine 0.44 - 1.00 mg/dL 7.61 9.50 9.32(I)  Sodium 135 - 145 mmol/L 138 137 137  Potassium 3.5 - 5.1 mmol/L 3.2(L) 3.7 3.8  Chloride 98 - 111 mmol/L 99 98 99  CO2 22 - 32 mmol/L 32 32 31  Calcium 8.9 - 10.3 mg/dL 6.9(L) 6.8(L) 6.6(L)  Total Protein 6.5 - 8.1 g/dL - - -  Total Bilirubin 0.3 -  1.2 mg/dL - - -  Alkaline Phos 38 - 126 U/L - - -  AST 15 - 41 U/L - - -  ALT 0 - 44 U/L - - -    CBC Latest Ref Rng & Units 07/03/2020 07/02/2020 07/01/2020  WBC 4.0 - 10.5 K/uL 9.3 7.5 7.2  Hemoglobin 12.0 - 15.0 g/dL 8.0(L) 7.4(L) 7.3(L)  Hematocrit 36.0 - 46.0 % 26.1(L) 22.9(L) 23.6(L)  Platelets 150 - 400 K/uL 194 159 171    ABG    Component Value Date/Time   PHART 7.404 06/26/2020 0908   PCO2ART 64.7 (H) 06/26/2020 0908   PO2ART 57 (L) 06/26/2020 0908   HCO3 40.1 (H) 06/26/2020 0908   TCO2 42 (H) 06/26/2020 0908   ACIDBASEDEF 2.9 (H) 06/19/2020 0332   O2SAT 86.0 06/26/2020 0908    CBG (last 3)  Recent Labs    07/03/20 0008 07/03/20 0333 07/03/20 0732  GLUCAP 95 97 107*    Signature:   Independent critical care time 32 minutes  Levy Pupa, MD, PhD 07/03/2020, 8:15 AM [facility] [phone] or if no answer before 7:00PM call [phone] For any issues after 7:00PM please call eLink [phone]

## 2020-07-03 NOTE — Procedures (Signed)
Cortrak  Person Inserting Tube:  Kendell Bane C, RD Tube Type:  Cortrak - 43 inches Tube Location:  Right nare Initial Placement:  Stomach Secured by: Bridle Technique Used to Measure Tube Placement:  Documented cm marking at nare/ corner of mouth Cortrak Secured At:  60 cm    Cortrak Tube Team Note:  Consult received to place a Cortrak feeding tube.   No x-ray is required. RN may begin using tube.   If the tube becomes dislodged please keep the tube and contact the Cortrak team at [email or web address] (password [email or web address]) for replacement.  If after hours and replacement cannot be delayed, place a NG tube and confirm placement with an abdominal x-ray.    Cammy Copa., RD, LDN, CNSC See AMiON for contact information

## 2020-07-03 NOTE — Progress Notes (Signed)
Nutrition Follow-up  DOCUMENTATION CODES:   Not applicable  INTERVENTION:   Continue tube feeding via Cortrak: - Vital 1.5 @ 60 ml/hr (1440 ml/day) - ProSource TF 45 ml BID  Tube feeding regimen provides 2240 kcal, 119 grams of protein, and 1094 ml of H2O.  - Continue Nutrisource Fiber BID per tube  NUTRITION DIAGNOSIS:   Increased nutrient needs related to acute illness (COVID-19 pneumonia) as evidenced by estimated needs.  Ongoing, being addressed via TF  GOAL:   Patient will meet greater than or equal to 90% of their needs  Met via TF  MONITOR:   Vent status,Labs,Weight trends,TF tolerance,Skin,I & O's  REASON FOR ASSESSMENT:   LOS    ASSESSMENT:   Patient transferred to ICU for heated high flow with BiPAP. She  presented with acute hypoxic respiratory failure due to COVID-19 pneumonia. Complaint of decreased appetite and general weakness prior to admission.  2/18 - admitted to [facility]  3/05 - intubated, transferred to [facility] 3/06 - L pneumothorax with pigtail cath placed 3/22 - tracheostomy 3/23 - Cortrak placed, tip gastric per Cortrak team  Discussed pt with RN and during ICU rounds. Per RN, pt complaining of pain to sacrum. Trach placed yesterday and OGT removed at that time. Cortrak placed this AM. Plan to resume TF now that enteral access is available.  Admit weight: 72.6 kg Current weight: 84.2 kg Lowest weight: 69.7 kg  Pt continues to have +2 pitting edema to BUE.  Patient currently on vent support via trach MV: 11.5 L/min Temp (24hrs), Avg:99.8 F (37.7 C), Min:99.14 F (37.3 C), Max:100.22 F (37.9 C)  Drips: Fentanyl  Medications reviewed and include: nutrisource fiber BID, lasix, SSI q 4 hours, protonix, IV abx  Labs reviewed: potassium 3.2, BUN 35, hemoglobin 8.0 CBG's: 68-144 x 24 hours  UOP: 2700 ml x 24 hours  Diet Order:   Diet Order            Diet NPO time specified  Diet effective midnight                 EDUCATION  NEEDS:   Not appropriate for education at this time  Skin:  Skin Assessment: Skin Integrity Issues: Other: skin tear to buttocks x 2  Last BM:  07/02/20 type 7 via flexiseal  Height:   Ht Readings from Last 1 Encounters:  06/22/20 '5\' 4"'  (1.626 m)    Weight:   Wt Readings from Last 1 Encounters:  07/03/20 84.2 kg    BMI:  Body mass index is 31.86 kg/m.  Estimated Nutritional Needs:   Kcal:  2100-2400 kcals  Protein:  105-120 g  Fluid:  >2000 ml daily    Gustavus Bryant, MS, RD, LDN Inpatient Clinical Dietitian Please see AMiON for contact information.

## 2020-07-03 NOTE — Evaluation (Signed)
Physical Therapy Evaluation Patient Details Name: Rachel [name] MRN: [id number] DOB: [birth year] Today's Date: 07/03/2020   History of Present Illness  Pt adm 2/18 to [facility] with acute hypoxic respiratory failure due to Covid PNA. Intubated 3/5 and transferred to [facility]. Pt with lt pneumothorax 3/6 and chest tube placed. Trach placed 3/22. PMH - kidney stones, smoker, obesity.  Clinical Impression  Pt presents to PT with decr mobility after severe illness and extended hospitalization. Pt motivated to work toward independence and expect she will make steady progress. Recommend LTACH.     Follow Up Recommendations LTACH    Equipment Recommendations  Other (comment)    Recommendations for Other Services       Precautions / Restrictions Precautions Precautions: Fall;Other (comment) Precaution Comments: vent, trach, chest tube, flexiseal      Mobility  Bed Mobility Overal bed mobility: Needs Assistance Bed Mobility: Rolling Rolling: Total assist;+2 for safety/equipment         General bed mobility comments: Placed pt in chair position and pt able to bring trunk forward from bed with mod assist. Performed x 5.    Transfers                 General transfer comment: Did not attempt  Ambulation/Gait                Stairs            Wheelchair Mobility    Modified Rankin (Stroke Patients Only)       Balance                                             Pertinent Vitals/Pain Pain Assessment: Faces Faces Pain Scale: Hurts little more Pain Location: buttocks Pain Descriptors / Indicators: Grimacing;Sore Pain Intervention(s): Monitored during session;Repositioned    Home Living Family/patient expects to be discharged to:: Other (Comment) (LTACH) Living Arrangements: Spouse/significant other Available Help at Discharge: Family Type of Home: House Home Access: Stairs to enter Entrance Stairs-Rails: None Secretary/administrator of  Steps: 2 Home Layout: One level Home Equipment: None      Prior Function Level of Independence: Independent               Hand Dominance   Dominant Hand: Right    Extremity/Trunk Assessment   Upper Extremity Assessment Upper Extremity Assessment: Defer to OT evaluation    Lower Extremity Assessment Lower Extremity Assessment: RLE deficits/detail;LLE deficits/detail RLE Deficits / Details: Strength grossly 2+ to 3-/5. LLE Deficits / Details: Strength grossly 2+ to 3-/5.       Communication   Communication: Tracheostomy  Cognition Arousal/Alertness: Awake/alert Behavior During Therapy: WFL for tasks assessed/performed Overall Cognitive Status: Within Functional Limits for tasks assessed                                 General Comments: Pt following commands, mouthing answers to questions appropriately.      General Comments General comments (skin integrity, edema, etc.): Pt on vent 45% FiO2 and 10 PEEP. Spo2 91-92%.    Exercises General Exercises - Upper Extremity Shoulder Flexion: AAROM;Both;10 reps;Supine Elbow Flexion: AAROM;Both;10 reps;Supine Elbow Extension: AAROM;Both;10 reps;Supine Digit Composite Flexion: AROM;Both;5 reps;Supine Composite Extension: AROM;Both;5 reps;Supine General Exercises - Lower Extremity Ankle Circles/Pumps: AROM;Both;10 reps;Supine Short Arc Quad: AAROM;Both;10 reps;Supine Heel Slides: AAROM;Both;10 reps;Supine  Hip ABduction/ADduction: AAROM;Both;10 reps;Supine   Assessment/Plan    PT Assessment Patient needs continued PT services  PT Problem List Decreased strength;Decreased activity tolerance;Decreased balance;Decreased mobility;Cardiopulmonary status limiting activity       PT Treatment Interventions DME instruction;Gait training;Functional mobility training;Therapeutic activities;Therapeutic exercise;Balance training;Patient/family education    PT Goals (Current goals can be found in the Care Plan  section)  Acute Rehab PT Goals Patient Stated Goal: Pt nodding to getting stronger and mobilizing PT Goal Formulation: With patient Time For Goal Achievement: 07/17/20 Potential to Achieve Goals: Good    Frequency Min 3X/week   Barriers to discharge        Co-evaluation               AM-PAC PT "6 Clicks" Mobility  Outcome Measure Help needed turning from your back to your side while in a flat bed without using bedrails?: Total Help needed moving from lying on your back to sitting on the side of a flat bed without using bedrails?: Total Help needed moving to and from a bed to a chair (including a wheelchair)?: Total Help needed standing up from a chair using your arms (e.g., wheelchair or bedside chair)?: Total Help needed to walk in hospital room?: Total Help needed climbing 3-5 steps with a railing? : Total 6 Click Score: 6    End of Session Equipment Utilized During Treatment: Oxygen (vent) Activity Tolerance: Patient tolerated treatment well Patient left: in bed;with call bell/phone within reach;with SCD's reapplied (Pt in chair position) Nurse Communication: Mobility status;Other (comment) (requested secretary obtain soft touch call bell) PT Visit Diagnosis: Other abnormalities of gait and mobility (R26.89);Muscle weakness (generalized) (M62.81)    Time: 7322-0254 PT Time Calculation (min) (ACUTE ONLY): 23 min   Charges:   PT Evaluation $PT Eval Moderate Complexity: 1 Mod PT Treatments $Therapeutic Activity: 8-22 mins        [facility] PT Acute Rehabilitation Services Pager [phone] Office [phone]   Angelina Ok [facility] 07/03/2020, 10:12 AM

## 2020-07-04 DIAGNOSIS — Z930 Tracheostomy status: Secondary | ICD-10-CM

## 2020-07-04 DIAGNOSIS — U071 COVID-19: Secondary | ICD-10-CM | POA: Diagnosis not present

## 2020-07-04 DIAGNOSIS — R0603 Acute respiratory distress: Secondary | ICD-10-CM | POA: Diagnosis not present

## 2020-07-04 DIAGNOSIS — E876 Hypokalemia: Secondary | ICD-10-CM | POA: Diagnosis not present

## 2020-07-04 DIAGNOSIS — J9621 Acute and chronic respiratory failure with hypoxia: Secondary | ICD-10-CM | POA: Diagnosis not present

## 2020-07-04 LAB — CBC
HCT: 23.3 % — ABNORMAL LOW (ref 36.0–46.0)
Hemoglobin: 7.4 g/dL — ABNORMAL LOW (ref 12.0–15.0)
MCH: 29.7 pg (ref 26.0–34.0)
MCHC: 31.8 g/dL (ref 30.0–36.0)
MCV: 93.6 fL (ref 80.0–100.0)
Platelets: 168 10*3/uL (ref 150–400)
RBC: 2.49 MIL/uL — ABNORMAL LOW (ref 3.87–5.11)
RDW: 16.2 % — ABNORMAL HIGH (ref 11.5–15.5)
WBC: 7.1 10*3/uL (ref 4.0–10.5)
nRBC: 0.3 % — ABNORMAL HIGH (ref 0.0–0.2)

## 2020-07-04 LAB — BASIC METABOLIC PANEL
Anion gap: 9 (ref 5–15)
BUN: 34 mg/dL — ABNORMAL HIGH (ref 8–23)
CO2: 31 mmol/L (ref 22–32)
Calcium: 7.6 mg/dL — ABNORMAL LOW (ref 8.9–10.3)
Chloride: 102 mmol/L (ref 98–111)
Creatinine, Ser: 0.84 mg/dL (ref 0.44–1.00)
GFR, Estimated: >60 mL/min (ref >60)
Glucose, Bld: 180 mg/dL — ABNORMAL HIGH (ref 70–99)
Potassium: 3 mmol/L — ABNORMAL LOW (ref 3.5–5.1)
Sodium: 142 mmol/L (ref 135–145)

## 2020-07-04 LAB — MAGNESIUM: Magnesium: 1.8 mg/dL (ref 1.7–2.4)

## 2020-07-04 LAB — GLUCOSE, CAPILLARY
Glucose-Capillary: 146 mg/dL — ABNORMAL HIGH (ref 70–99)
Glucose-Capillary: 147 mg/dL — ABNORMAL HIGH (ref 70–99)
Glucose-Capillary: 148 mg/dL — ABNORMAL HIGH (ref 70–99)
Glucose-Capillary: 158 mg/dL — ABNORMAL HIGH (ref 70–99)
Glucose-Capillary: 178 mg/dL — ABNORMAL HIGH (ref 70–99)
Glucose-Capillary: 179 mg/dL — ABNORMAL HIGH (ref 70–99)

## 2020-07-04 MED ORDER — FENTANYL 25 MCG/HR TD PT72
1.0000 | MEDICATED_PATCH | TRANSDERMAL | Status: DC
  Administered 2020-07-04 – 2020-07-07 (×2): 1 via TRANSDERMAL
  Filled 2020-07-04 (×2): qty 1

## 2020-07-04 MED ORDER — OXYCODONE HCL 5 MG PO TABS
5.0000 mg | ORAL_TABLET | Freq: Four times a day (QID) | ORAL | Status: DC
  Administered 2020-07-04 – 2020-07-08 (×14): 5 mg
  Filled 2020-07-04 (×14): qty 1

## 2020-07-04 MED ORDER — POTASSIUM CHLORIDE 10 MEQ/50ML IV SOLN
10.0000 meq | INTRAVENOUS | Status: AC
  Administered 2020-07-04 (×4): 10 meq via INTRAVENOUS
  Filled 2020-07-04 (×4): qty 50

## 2020-07-04 MED ORDER — MAGNESIUM SULFATE 2 GM/50ML IV SOLN
2.0000 g | Freq: Once | INTRAVENOUS | Status: AC
  Administered 2020-07-04: 2 g via INTRAVENOUS
  Filled 2020-07-04: qty 50

## 2020-07-04 MED ORDER — FENTANYL CITRATE (PF) 100 MCG/2ML IJ SOLN
50.0000 ug | INTRAMUSCULAR | Status: DC | PRN
  Administered 2020-07-04 – 2020-07-06 (×7): 50 ug via INTRAVENOUS
  Filled 2020-07-04 (×7): qty 2

## 2020-07-04 MED ORDER — POTASSIUM CHLORIDE 20 MEQ PO PACK
40.0000 meq | PACK | ORAL | Status: AC
  Administered 2020-07-04 (×2): 40 meq
  Filled 2020-07-04 (×2): qty 2

## 2020-07-04 MED ORDER — FUROSEMIDE 10 MG/ML IJ SOLN
40.0000 mg | Freq: Two times a day (BID) | INTRAMUSCULAR | Status: AC
  Administered 2020-07-04 (×2): 40 mg via INTRAVENOUS
  Filled 2020-07-04 (×2): qty 4

## 2020-07-04 MED ORDER — POTASSIUM CHLORIDE 20 MEQ PO PACK
20.0000 meq | PACK | ORAL | Status: DC
  Administered 2020-07-04: 20 meq
  Filled 2020-07-04: qty 1

## 2020-07-04 NOTE — Progress Notes (Signed)
[name], MRN:  [id number], DOB:  [birth year], LOS: 34 ADMISSION DATE:  05/31/2020, CONSULTATION DATE: 06/15/2020 REFERRING MD: [facility], CHIEF COMPLAINT: Acute respiratory failure secondary to Covid with subsequent possible acquired pneumonia   Brief History:  65 yo female former smoker presented to [facility] on 05/31/20 with one week of progressive dyspnea from upper respiratory tract infection.  Found to have severe hypoxia with SpO2 50%.  She hadn't received COVID 19 vaccination.  She was treated for COVID 19 pneumonia with steroids, remdesivir and tocilizumab on 2/19.  She was started on ABx for possible HCAP.  She ultimately required intubation and transferred to [facility].  Pneumococcal urine antigen positive.  Past Medical History:  GERD, HLD, Nephrolithiasis  Significant Hospital Events:  2/18 Admit to [facility], start steroids and remdesivir, treated with tocilizumab 3/03 increasing O2 requirements, started on Bipap 3/05 intubated, transferred to [facility] 3/06 Lt pneumothorax >> pig tail catheter placed 3/07 hypoxia/hypotension/vent dys-synchrony >> improved after chest tube flushed 3/8 d/c steroids, start HCO3 gtt 3/10 d/c HCO3 gtt, off pressors 3/11-3/13 Diuresis 3/19 persistent fever, change ABx, transfuse 1 unit PRBC 3/22 tracheostomy  Consults:    Procedures:  ETT 3/05 >> 3/22 Lt IJ CVL 3/06 >> Lt pig tail catheter 3/06 >> Trach 3/22 >>   Significant Diagnostic Tests:   CT chest 2/18 >> patchy b/l ASD  Doppler legs b/l 2/21 >> no DVT, 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  No clear air-fluid levels.  Left side pigtail in place without pneumothorax  Micro Data:  COVID 2/18 >> positive MRSA PCR 3/05 >> negative Blood 3/05 >> negative Pneumococcal urine Ag 3/05 >> positive Sputum 3/17 >> Rare Candida albicans Sputum  3/19 >> Rare Candida albicans  Antimicrobials:  Vancomycin 3/05 >> 3/07 Zosyn 3/05>> off  Cefepime 3/05 >> 3/07 Rocephin 3/07 >> 3/12, 3/16>>> 3/19 Linezolid 3/19 >>3/25 Elita Quick 3/19 >> 3/25  Interim History / Subjective:  Hypokalemic  coretrak placed, back on TF  No sig change overnight  Awake, follows some commands but seems confused Failed SBT quickly - low Vt, rapid RR    Objective   Blood pressure (!) 143/85, pulse 94, temperature 98.6 F (37 C), temperature source Bladder, resp. rate (!) 25, height 5\' 4"  (1.626 m), weight 84.2 kg, SpO2 95 %.    Vent Mode: PRVC FiO2 (%):  [40 %-60 %] 50 % Set Rate:  [26 bmp] 26 bmp Vt Set:  [420 mL] 420 mL PEEP:  [10 cmH20] 10 cmH20 Plateau Pressure:  [25 cmH20-28 cmH20] 28 cmH20   Intake/Output Summary (Last 24 hours) at 07/04/2020 0920 Last data filed at 07/04/2020 0800 Gross per 24 hour  Intake 2304.11 ml  Output 3150 ml  Net -845.89 ml   Filed Weights   07/01/20 0116 07/02/20 0409 07/03/20 0500  Weight: 80.3 kg 80.2 kg 84.2 kg   Physical Exam: General:  Chronically ill appearing female, NAD  HEENT: MM pink/moist, trach c/d  Neuro: awake, clicks her tongue to get attention but then does not attempt to communicate her need, follows some commands, MAE, severe gen weakness  CV: s1s2 rrr, no m/r/g PULM:  resps even non labored on vent, peep 10, does not tol SBT, L chest tube to water seal, no airleak GI: soft, bsx4 active  Extremities: warm/dry, 1+ BLE edema  Skin: no rashes or lesions  No new CXR 3/24  Resolved Hospital Problem list   Non gap metabolic acidosis, Elevated LFTs, Hypotension from meds, Septic shock, AKI from ATN 2nd to sepsis, Thrombocytopenia from sepsis, Hypernatremia, Constipation  Assessment & Plan:   Acute hypoxic/hypercapnic respiratory failure from COVID 19 pneumonia with ARDS complicated by Pneumococcal bacterial pneumonia, left pneumothorax. - had progressive pneumatoceles on CT chest from 3/15 and  persistent fever from 3/13 to 3/16 -Ceftriaxone started 3/16, expanded linezolid plus ceftazidime on 3/19 with some improvement in fever curve and respiratory status.  Plan to continue the broad coverage for 7 days total - stop date 07/05/20 PLAN -  Vent support - 8cc/kg  F/u CXR  F/u ABG Diuresis as tol - Repeat lasix BID 3/24   Lt pneumothorax. -Okay to continue on waterseal and follow chest x-ray. -Consider DC chest tube given overall stability on waterseal over several days. -Flush chest tube every shift while in place  Acute metabolic encephalopathy 2nd to hypoxia/hypercapnia, uremic encepahalopathy.  Much improved -RASS goal 0 D/c fentanyl gtt  PRN fentanyl or per tube oxy   Anemia of critical illness.. -Follow CBC -Transfusion goal hemoglobin 7.0 Prophylactic dose lovenox per pharmacy   Hyperglycemia. Now hypoglycemia with interruption of tube feeding 3/22 -Sliding scale insulin as ordered - holding lantus for now - was held when TF off and blood sugars remain controlled with SSI for now  Hx of HLD. -simvastatin    Best practice (evaluated daily)  Diet: tube feeds DVT prophylaxis: Enoxaparin GI prophylaxis: Pantoprazole Mobility: Bedrest Disposition: ICU  Code Status: Full Family: Spoke with the patient's husband by phone 3/23   Labs    CMP Latest Ref Rng & Units 07/04/2020 07/03/2020 07/02/2020  Glucose 70 - 99 mg/dL 458(P) 929(W) 79  BUN 8 - 23 mg/dL 44(Q) 28(M) 38(T)  Creatinine 0.44 - 1.00 mg/dL 7.71 1.65 7.90  Sodium 135 - 145 mmol/L 142 138 137  Potassium 3.5 - 5.1 mmol/L 3.0(L) 3.2(L) 3.7  Chloride 98 - 111 mmol/L 102 99 98  CO2 22 - 32 mmol/L 31 32 32  Calcium 8.9 - 10.3 mg/dL 7.6(L) 6.9(L) 6.8(L)  Total Protein 6.5 - 8.1 g/dL - - -  Total Bilirubin 0.3 - 1.2 mg/dL - - -  Alkaline Phos 38 - 126 U/L - - -  AST 15 - 41 U/L - - -  ALT 0 - 44 U/L - - -    CBC Latest Ref Rng & Units 07/04/2020 07/03/2020 07/02/2020  WBC 4.0 - 10.5 K/uL 7.1 9.3 7.5   Hemoglobin 12.0 - 15.0 g/dL 7.4(L) 8.0(L) 7.4(L)  Hematocrit 36.0 - 46.0 % 23.3(L) 26.1(L) 22.9(L)  Platelets 150 - 400 K/uL 168 194 159    ABG    Component Value Date/Time   PHART 7.404 06/26/2020 0908   PCO2ART 64.7 (H) 06/26/2020 0908   PO2ART 57 (L) 06/26/2020 0908   HCO3 40.1 (H) 06/26/2020 0908   TCO2 42 (H) 06/26/2020 0908   ACIDBASEDEF 2.9 (H) 06/19/2020 0332   O2SAT 86.0 06/26/2020 0908    CBG (last 3)  Recent Labs    07/03/20 2327 07/04/20 0321 07/04/20 0741  GLUCAP 212* 158* 178*    Signature:   Independent critical care time 32 minutes  Dirk Dress, NP Pulmonary/Critical Care Medicine  07/04/2020  9:20 AM

## 2020-07-04 NOTE — Progress Notes (Addendum)
Occupational Therapy Evaluation Patient Details Name: Rachel [name] MRN: [id number] DOB: [birth year] Today's Date: 07/04/2020    History of Present Illness Pt adm 2/18 to [facility] with acute hypoxic respiratory failure due to Covid PNA. Intubated 3/5 and transferred to [facility]. Pt with lt pneumothorax 3/6 and chest tube placed. Trach placed 3/22. PMH - kidney stones, smoker, obesity.   Clinical Impression   PTA pt lives independently with husband and states she was working prior to getting sick. Pt seen on PRVC 50% FiO2; Peep 10 with VSS. Tolerated chair position well during BUE A/AAROM. Pt demonstrates a significant functional decline due to deficits listed below and currently requires total A with ADL and Total A +2 with mobility. Pt following commands however distracted at times and demonstrates slow processing. Will further assess cognition as pt progresses. Feel pt will be a good candidate for using a Tilt bed to verticalize and progress mobility - discussed with Dr Delton Coombes who agreed. Will further assess appropriateness next week. Recommend rehab at [facility]. Will follow acutely.     Follow Up Recommendations  LTACH;Supervision/Assistance - 24 hour    Equipment Recommendations  Other (comment) (TBA)    Recommendations for Other Services       Precautions / Restrictions Precautions Precautions: Fall;Other (comment) Precaution Comments: vent, trach, core track; chest tube, flexiseal Restrictions Weight Bearing Restrictions: No      Mobility Bed Mobility Overal bed mobility: Needs Assistance Bed Mobility: Rolling Rolling: Total assist;+2 for safety/equipment         General bed mobility comments: Placed pt in chair position and pt able to bring trunk forward from bed with mod assist.    Transfers                 General transfer comment: Did not attempt    Balance Overall balance assessment: Needs assistance   Sitting balance-Leahy Scale: Poor                                      ADL either performed or assessed with clinical judgement   ADL Overall ADL's : Needs assistance/impaired                                       General ADL Comments: total A all ADL tasks     Vision Baseline Vision/History: Wears glasses Wears Glasses: Reading only Additional Comments: appears intact     Perception     Praxis      Pertinent Vitals/Pain Pain Assessment: Faces Faces Pain Scale: Hurts little more Pain Location: buttocks (general discomfort) Pain Descriptors / Indicators: Grimacing;Sore;Discomfort Pain Intervention(s): Limited activity within patient's tolerance     Hand Dominance Right   Extremity/Trunk Assessment Upper Extremity Assessment- weaker proximally BUE Upper Extremity Assessment: RUE deficits/detail;LUE deficits/detail RUE Deficits / Details: gross grasp/release; 3+/5; diffiuclty with opposition of thumb to ring and little fingers; poor in-hand manipulation skills; min edema; elbof flex 2/5;ext 3+/5;sup 2/5;pron 3/5; shoulder flexion 1/5;ext 3/5; ER 1/5;IR 3/5; Abd 1/5. unable to lift RUE off bed; unable to complete hand to mouth or move hand on communication board RUE Coordination: decreased fine motor;decreased gross motor LUE Deficits / Details: similar to R; mod edema L hand LUE Coordination: decreased fine motor;decreased gross motor   Lower Extremity Assessment Lower Extremity Assessment: Defer to PT evaluation  Cervical / Trunk Assessment Cervical / Trunk Assessment: Other exceptions (increased body habitus; able to maintain trunk in midline)   Communication Communication Communication: Tracheostomy   Cognition Arousal/Alertness: Awake/alert Behavior During Therapy: WFL for tasks assessed/performed Overall Cognitive Status: Impaired/Different from baseline Area of Impairment: Attention;Problem solving;Following commands                               General Comments: Pt following  commands with increased time, mouthing answers to questions appropriately; externally distracted at times; will further assess   General Comments       Exercises Exercises: General Upper Extremity;General Lower Extremity General Exercises - Upper Extremity Shoulder Flexion: AAROM;Both;10 reps;Supine Shoulder Extension: AAROM;Both;10 reps;Supine Shoulder ABduction: AAROM;Both;10 reps;Seated Shoulder ADduction: AAROM;Both;10 reps;Seated Elbow Flexion: AAROM;Both;10 reps;Supine Elbow Extension: AAROM;Both;10 reps;Supine Wrist Flexion: AAROM;Both;10 reps;Seated Wrist Extension: AAROM;Both;10 reps;Seated Digit Composite Flexion: AROM;Both;10 reps;Seated Composite Extension: AROM;Both;10 reps;Seated General Exercises - Lower Extremity Short Arc Quad: AAROM Heel Slides: AAROM;Both;10 reps;Supine Hip ABduction/ADduction: AAROM;Both;10 reps;Supine   Shoulder Instructions      Home Living Family/patient expects to be discharged to:: Other (Comment) (LTACH) Living Arrangements: Spouse/significant other Available Help at Discharge: Family Type of Home: House Home Access: Stairs to enter Secretary/administrator of Steps: 2 Entrance Stairs-Rails: None Home Layout: One level     Bathroom Shower/Tub: Chief Strategy Officer: Standard     Home Equipment: None          Prior Functioning/Environment Level of Independence: Independent        Comments: States she was working        OT Problem List: Decreased strength;Decreased range of motion;Decreased activity tolerance;Impaired balance (sitting and/or standing);Decreased coordination;Decreased cognition;Decreased knowledge of use of DME or AE;Decreased safety awareness;Cardiopulmonary status limiting activity;Obesity;Impaired UE functional use;Pain;Increased edema      OT Treatment/Interventions: Self-care/ADL training;Therapeutic exercise;Neuromuscular education;Energy conservation;DME and/or AE  instruction;Therapeutic activities;Cognitive remediation/compensation;Patient/family education;Balance training    OT Goals(Current goals can be found in the care plan section) Acute Rehab OT Goals Patient Stated Goal: Pt nodding to wanting to work with OT and do more for herself, especially use her arms OT Goal Formulation: With patient Time For Goal Achievement: 07/18/20 Potential to Achieve Goals: Good  OT Frequency: Min 2X/week   Barriers to D/C:            Co-evaluation              AM-PAC OT "6 Clicks" Daily Activity     Outcome Measure Help from another person eating meals?: Total Help from another person taking care of personal grooming?: Total Help from another person toileting, which includes using toliet, bedpan, or urinal?: Total Help from another person bathing (including washing, rinsing, drying)?: Total Help from another person to put on and taking off regular upper body clothing?: Total Help from another person to put on and taking off regular lower body clothing?: Total 6 Click Score: 6   End of Session Equipment Utilized During Treatment: Other (comment) (vent) Nurse Communication: Mobility status;Other (comment) (possible use of tilt bed)  Activity Tolerance: Patient tolerated treatment well Patient left: in bed;with call bell/phone within reach;with nursing/sitter in room;Other (comment) (chair position)  OT Visit Diagnosis: Other abnormalities of gait and mobility (R26.89);Muscle weakness (generalized) (M62.81);Other symptoms and signs involving cognitive function;Pain Pain - part of body:  (butt; generalized)                Time: 0932-3557 OT Time  Calculation (min): 38 min Charges:  OT General Charges $OT Visit: 1 Visit OT Evaluation $OT Eval High Complexity: 1 High OT Treatments $Therapeutic Activity: 8-22 mins $Neuromuscular Re-education: 8-22 mins  Luisa Dago, OT/L   Acute OT Clinical Specialist Acute Rehabilitation Services Pager  [phone] Office [phone]   [facility] 07/04/2020, 10:01 AM

## 2020-07-04 NOTE — Progress Notes (Signed)
[facility] ADULT ICU REPLACEMENT PROTOCOL   The patient does apply for the [facility] Adult ICU Electrolyte Replacment Protocol based on the criteria listed below:   1. Is GFR >/= 30 ml/min? Yes.    Patient's GFR today is >60 2. Is SCr </= 2? Yes.   Patient's SCr is 0.84 ml/kg/hr 3. Did SCr increase >/= 0.5 in 24 hours? No. 4. Abnormal electrolyte(s): k 3.0, mag 1.8 5. Ordered repletion with: protocol 6. If a panic level lab has been reported, has the CCM MD in charge been notified? No..   Physician:    Markus Daft A 07/04/2020 5:36 AM

## 2020-07-04 NOTE — Progress Notes (Signed)
SLP Cancellation Note  Patient Details Name: Rachel [name] MRN: [id number] DOB: April 19, 1955   Cancelled treatment:       Reason Eval/Treat Not Completed: Medical issues which prohibited therapy (Pt remains on the vent at this time. SLP will follow up.)  Shanika I. Vear Clock, MS, CCC-SLP Acute Rehabilitation Services Office number [phone] Pager [phone]  Scheryl Marten 07/04/2020, 10:11 AM

## 2020-07-04 NOTE — Progress Notes (Signed)
Pharmacy Antibiotic Note  Rachel [name] is a 65 y.o. female admitted on 05/31/2020 with persistent fever and concern for HAP.  Pharmacy has been consulted for Ceftazidime dosing.  Also on Zyvox.  Patient has been on Ceftriaxone for concern of Streptococcus pneumoniae with pseudoceles on CXR needing longer course. Despite lengthening ceftriaxone, patient has continued to spike fevers and tachycardic.  Renal function improving, now afebrile, WBC WNL.  Plan: Continue Fortaz 2gm IV Q8H Zyvox 600mg  IV Q12H Monitor renal fxn, clinical progress Planning 7 days of therapy  Height: 5\' 4"  (162.6 cm) Weight: 84.2 kg (185 lb 10 oz) IBW/kg (Calculated) : 54.7  Temp (24hrs), Avg:99.7 F (37.6 C), Min:98.6 F (37 C), Max:100.58 F (38.1 C)  Recent Labs  Lab 06/30/20 0015 07/01/20 0422 07/02/20 0321 07/03/20 0403 07/04/20 0345  WBC 8.2 7.2 7.5 9.3 7.1  CREATININE 1.27* 1.04* 0.92 0.88 0.84    Estimated Creatinine Clearance: 72 mL/min (by C-G formula based on SCr of 0.84 mg/dL).    No Known Allergies  Remdesivir 2/18 >> 2/22  Vanc 3/5>>3/7 Zosyn 3/5>>3/5 Cefep 3/5>>3/7 CTX  3/7>> 3/12, 3/16>>3/19 5/12 3/19 >> Zyvox 3/19 >>  2/18 SARS2-CV positive 3/5BCx: negative 3/5UCx: negative 3/5MRSA PCR: Negative  3/5 Strep pneumo Ur Ag -  positive 3/17 TA - C.albicans 3/19 TA - reincubated   Roshawnda Pecora D. 4/19, PharmD, BCPS, BCCCP 07/04/2020, 8:37 AM

## 2020-07-04 NOTE — TOC Progression Note (Signed)
Transition of Care [facility]) - Progression Note    Patient Details  Name: Rachel [name] MRN: [id number] Date of Birth: 09-19-1955  [facility] CM/SW Contact  Glennon Mac, RN Phone Number: 07/04/2020, 4:15 PM  Clinical Narrative: Follow-up phone call to patient's husband, Dorinda Hill, to discuss possible long-term acute care hospital.  Per MD/PA, patient may be ready for LTAC as soon as early next week.  Husband frustrated, stating that MD told him she was not ready.  He states " I will let someone know when I make a decision."  TOC will continue to follow for discharge planning as patient progresses.    Expected Discharge Plan: Long Term Acute Care (LTAC) Barriers to Discharge: Continued Medical Work up  Expected Discharge Plan and Services Expected Discharge Plan: Long Term Acute Care (LTAC) In-house Referral: Clinical Social Work Discharge Planning Services: CM Consult Post Acute Care Choice: Long Term Acute Care (LTAC) Living arrangements for the past 2 months: Single Family Home                 DME Arranged: N/A DME Agency: NA Date DME Agency Contacted: 06/11/20 Time DME Agency Contacted: 1037 Representative spoke with at DME Agency: [facility]             Social Determinants of Health (SDOH) Interventions    Readmission Risk Interventions No flowsheet data found.  Quintella Baton, RN, BSN  Trauma/Neuro ICU Case Manager [phone]

## 2020-07-05 ENCOUNTER — Inpatient Hospital Stay (HOSPITAL_COMMUNITY): Payer: BLUE CROSS/BLUE SHIELD

## 2020-07-05 DIAGNOSIS — U071 COVID-19: Secondary | ICD-10-CM | POA: Diagnosis not present

## 2020-07-05 DIAGNOSIS — J1282 Pneumonia due to coronavirus disease 2019: Secondary | ICD-10-CM | POA: Diagnosis not present

## 2020-07-05 DIAGNOSIS — J9601 Acute respiratory failure with hypoxia: Secondary | ICD-10-CM | POA: Diagnosis not present

## 2020-07-05 LAB — BASIC METABOLIC PANEL
Anion gap: 6 (ref 5–15)
BUN: 35 mg/dL — ABNORMAL HIGH (ref 8–23)
CO2: 35 mmol/L — ABNORMAL HIGH (ref 22–32)
Calcium: 8.4 mg/dL — ABNORMAL LOW (ref 8.9–10.3)
Chloride: 101 mmol/L (ref 98–111)
Creatinine, Ser: 0.73 mg/dL (ref 0.44–1.00)
GFR, Estimated: >60 mL/min (ref >60)
Glucose, Bld: 157 mg/dL — ABNORMAL HIGH (ref 70–99)
Potassium: 4.1 mmol/L (ref 3.5–5.1)
Sodium: 142 mmol/L (ref 135–145)

## 2020-07-05 LAB — MAGNESIUM: Magnesium: 2.1 mg/dL (ref 1.7–2.4)

## 2020-07-05 LAB — CBC
HCT: 24.2 % — ABNORMAL LOW (ref 36.0–46.0)
Hemoglobin: 7.4 g/dL — ABNORMAL LOW (ref 12.0–15.0)
MCH: 28.9 pg (ref 26.0–34.0)
MCHC: 30.6 g/dL (ref 30.0–36.0)
MCV: 94.5 fL (ref 80.0–100.0)
Platelets: 175 10*3/uL (ref 150–400)
RBC: 2.56 MIL/uL — ABNORMAL LOW (ref 3.87–5.11)
RDW: 16.7 % — ABNORMAL HIGH (ref 11.5–15.5)
WBC: 7.2 10*3/uL (ref 4.0–10.5)
nRBC: 0.4 % — ABNORMAL HIGH (ref 0.0–0.2)

## 2020-07-05 LAB — GLUCOSE, CAPILLARY
Glucose-Capillary: 168 mg/dL — ABNORMAL HIGH (ref 70–99)
Glucose-Capillary: 163 mg/dL — ABNORMAL HIGH (ref 70–99)
Glucose-Capillary: 210 mg/dL — ABNORMAL HIGH (ref 70–99)
Glucose-Capillary: 154 mg/dL — ABNORMAL HIGH (ref 70–99)
Glucose-Capillary: 158 mg/dL — ABNORMAL HIGH (ref 70–99)

## 2020-07-05 MED ORDER — FUROSEMIDE 10 MG/ML IJ SOLN
40.0000 mg | Freq: Two times a day (BID) | INTRAMUSCULAR | Status: AC
  Administered 2020-07-05 – 2020-07-06 (×2): 40 mg via INTRAVENOUS
  Filled 2020-07-05 (×2): qty 4

## 2020-07-05 NOTE — Progress Notes (Signed)
Occupational Therapy Treatment Patient Details Name: Rachel [name] MRN: [id number] DOB: June 19, 1955 Today's Date: 07/05/2020    History of present illness Pt adm 2/18 to [facility] with acute hypoxic respiratory failure due to Covid PNA. Intubated 3/5 and transferred to [facility]. Pt with lt pneumothorax 3/6 and chest tube placed. Trach placed 3/22. PMH - kidney stones, smoker, obesity.   OT comments  Focus of session on family education with daughter on BUE ROM, use of squeeze ball and positioning. Pt issued picture communication board to help pt communicate with her daughter/staff. Session limited as nurse in to place IV. Will continue to follow. Will further assess appropriateness for Tilt bed next week.   Follow Up Recommendations  LTACH;Supervision/Assistance - 24 hour    Equipment Recommendations  Other (comment)    Recommendations for Other Services      Precautions / Restrictions Precautions Precautions: Fall;Other (comment) Precaution Comments: vent, trach, flexiseal Restrictions Weight Bearing Restrictions: No       Mobility Bed Mobility               General bed mobility comments: Placed pt in chair position and pt able to bring trunk forward from bed with min assist to mod assist once pts UEs placed on bed rails.    Transfers                 General transfer comment: Did not attempt    Balance Overall balance assessment: Needs assistance Sitting-balance support: Bilateral upper extremity supported;Feet unsupported Sitting balance-Leahy Scale: Poor Sitting balance - Comments: Bed egressed to full chair position and pt sat a total of 7 min (1 minute, , 2 minutes then 1 minute) with back unsupported and UEs on rails.  Pt performed LE exercises while sitting like this as well.                                   ADL either performed or assessed with clinical judgement   ADL                                                Vision   Additional Comments: wears reading glasses - asked daughter to bring glasses in   Perception     Praxis      Cognition Arousal/Alertness: Awake/alert Behavior During Therapy: WFL for tasks assessed/performed Overall Cognitive Status: Impaired/Different from baseline Area of Impairment: Attention;Problem solving;Following commands                   Current Attention Level: Selective   Following Commands: Follows one step commands with increased time     Problem Solving: Slow processing;Difficulty sequencing;Requires verbal cues;Requires tactile cues General Comments: Pt following commands with increased time, mouthing answers to questions appropriately        Exercises Exercises: General Upper Extremity;General Lower Extremity;Other exercises General Exercises - Upper Extremity Shoulder Flexion: AAROM;Both;10 reps;Supine Elbow Flexion: AAROM;Both;10 reps;Supine Elbow Extension: AAROM;Both;10 reps;Supine General Exercises - Lower Extremity Ankle Circles/Pumps: AROM;Both;10 reps;Supine Long Arc Quad:  (5 reps x 4 x) Heel Slides: Both;Supine;5 reps;AAROM Other Exercises Other Exercises: issued squeeze ball for grip and pinch strengthening Other Exercises: began education with daughter regarding BUE AAROM  - shoulder flexion to 90 OK Other Exercises: encouraged cervical  and thoracic ROM using "cat/cow" pose  Other Exercises: Encouraged weight shifting bed level with crossing midline Other Exercises: issued picture communication board   Shoulder Instructions       General Comments Vent 60% FiO2 with PEEP 8 with VSS during treatment    Pertinent Vitals/ Pain       Pain Assessment: Faces Faces Pain Scale: Hurts a little bit Pain Location: buttocks Pain Descriptors / Indicators: Grimacing;Sore;Discomfort Pain Intervention(s): Limited activity within patient's tolerance  Home Living                                          Prior  Functioning/Environment              Frequency  Min 2X/week        Progress Toward Goals  OT Goals(current goals can now be found in the care plan section)  Progress towards OT goals: Progressing toward goals  Acute Rehab OT Goals Patient Stated Goal: to get stronger OT Goal Formulation: With patient Time For Goal Achievement: 07/18/20 Potential to Achieve Goals: Good ADL Goals Pt Will Perform Grooming: with mod assist;sitting;bed level Pt Will Perform Upper Body Bathing: with max assist;sitting;bed level Pt/caregiver will Perform Home Exercise Program: Increased strength;Increased ROM;Both right and left upper extremity;With minimal assist;With written HEP provided Additional ADL Goal #1: Pt will demonstrate hand to mouth movement BUE with min A against gravitiy in preparation for ADL training. Additional ADL Goal #2: Pt will tolerate upright seated position @ 60 degrees for 2 hrs to progress sitting tolerance for ADL tasks Additional ADL Goal #3: Pt will use written communication/board to commmunicate needs with min A  Plan Discharge plan remains appropriate    Co-evaluation                 AM-PAC OT "6 Clicks" Daily Activity     Outcome Measure   Help from another person eating meals?: Total Help from another person taking care of personal grooming?: Total Help from another person toileting, which includes using toliet, bedpan, or urinal?: Total Help from another person bathing (including washing, rinsing, drying)?: Total Help from another person to put on and taking off regular upper body clothing?: Total Help from another person to put on and taking off regular lower body clothing?: Total 6 Click Score: 6    End of Session Equipment Utilized During Treatment:  (vent)  OT Visit Diagnosis: Other abnormalities of gait and mobility (R26.89);Muscle weakness (generalized) (M62.81);Other symptoms and signs involving cognitive function;Pain Pain - part of body:   (buttocks)   Activity Tolerance Other (comment) (treatment limited due to IV being placed)   Patient Left in bed;with call bell/phone within reach;with family/visitor present   Nurse Communication Mobility status        Time: 1610-9604 OT Time Calculation (min): 18 min  Charges: OT General Charges $OT Visit: 1 Visit OT Treatments $Therapeutic Activity: 8-22 mins  Luisa Dago, OT/L   Acute OT Clinical Specialist Acute Rehabilitation Services Pager [phone] Office [phone]    [facility] 07/05/2020, 5:44 PM

## 2020-07-05 NOTE — Progress Notes (Signed)
SLP Cancellation Note  Patient Details Name: Rachel [name] MRN: [id number] DOB: 07/01/1955   Cancelled treatment:       Reason Eval/Treat Not Completed: Patient not medically ready. Discussed pt with RN, who shared that MD increased vent settings this morning. Note plan to increase PEEP while working on weaning FiO2. Pt also appears to intermittently follow some commands. She may be ready for inline PMV soon - will continue to follow.     Mahala Menghini., M.A. CCC-SLP Acute Rehabilitation Services Pager [phone] Office [phone]  07/05/2020, 12:32 PM

## 2020-07-05 NOTE — Progress Notes (Signed)
Physical Therapy Treatment Patient Details Name: Rachel [name] MRN: [id number] DOB: Aug 17, 1955 Today's Date: 07/05/2020    History of Present Illness Pt adm 2/18 to [facility] with acute hypoxic respiratory failure due to Covid PNA. Intubated 3/5 and transferred to [facility]. Pt with lt pneumothorax 3/6 and chest tube placed. Trach placed 3/22. PMH - kidney stones, smoker, obesity.    PT Comments    Pt admitted with above diagnosis. Bed egressed to full chair position and pt sat a total of 7 min (1 minute, , 2 minutes then 1 minute) with back unsupported and UEs on rails.  Pt fatigues and needs frequent rest breaks between bouts of activity. Recovers and is able to continue.  Pt very motivated and works hard with what she is asked to do. Will continue to progress pt as able. Pt currently with functional limitations due to balance and endurance deficits. Pt will benefit from skilled PT to increase their independence and safety with mobility to allow discharge to the venue listed below.     Follow Up Recommendations  LTACH     Equipment Recommendations  Other (comment)    Recommendations for Other Services       Precautions / Restrictions Precautions Precautions: Fall;Other (comment) Precaution Comments: vent, trach, flexiseal Restrictions Weight Bearing Restrictions: No    Mobility  Bed Mobility               General bed mobility comments: Placed pt in chair position and pt able to bring trunk forward from bed with min assist to mod assist once pts UEs placed on bed rails.    Transfers                 General transfer comment: Did not attempt  Ambulation/Gait                 Stairs             Wheelchair Mobility    Modified Rankin (Stroke Patients Only)       Balance Overall balance assessment: Needs assistance Sitting-balance support: Bilateral upper extremity supported;Feet unsupported Sitting balance-Leahy Scale: Poor Sitting balance -  Comments: Bed egressed to full chair position and pt sat a total of 7 min (1 minute, , 2 minutes then 1 minute) with back unsupported and UEs on rails.  Pt performed LE exercises while sitting like this as well.                                    Cognition Arousal/Alertness: Awake/alert Behavior During Therapy: WFL for tasks assessed/performed Overall Cognitive Status: Impaired/Different from baseline Area of Impairment: Attention;Problem solving;Following commands                               General Comments: Pt following commands with increased time, mouthing answers to questions appropriately      Exercises General Exercises - Upper Extremity Shoulder Flexion: AAROM;Both;10 reps;Supine Elbow Flexion: AAROM;Both;10 reps;Supine Elbow Extension: AAROM;Both;10 reps;Supine General Exercises - Lower Extremity Ankle Circles/Pumps: AROM;Both;10 reps;Supine Long Arc Quad: AAROM;Both;5 reps;Seated (5 reps x 4 x) Heel Slides: Both;Supine;5 reps;AAROM    General Comments General comments (skin integrity, edema, etc.): Vent 60% FiO2 with PEEP 8 with VSS during treatment      Pertinent Vitals/Pain Pain Assessment: No/denies pain    Home Living  Prior Function            PT Goals (current goals can now be found in the care plan section) Progress towards PT goals: Progressing toward goals    Frequency    Min 3X/week      PT Plan Current plan remains appropriate    Co-evaluation              AM-PAC PT "6 Clicks" Mobility   Outcome Measure  Help needed turning from your back to your side while in a flat bed without using bedrails?: Total Help needed moving from lying on your back to sitting on the side of a flat bed without using bedrails?: Total Help needed moving to and from a bed to a chair (including a wheelchair)?: Total Help needed standing up from a chair using your arms (e.g., wheelchair or  bedside chair)?: Total Help needed to walk in hospital room?: Total Help needed climbing 3-5 steps with a railing? : Total 6 Click Score: 6    End of Session Equipment Utilized During Treatment: Oxygen (vent) Activity Tolerance: Patient tolerated treatment well Patient left: in bed;with call bell/phone within reach;with SCD's reapplied (Pt in chair position) Nurse Communication: Mobility status PT Visit Diagnosis: Other abnormalities of gait and mobility (R26.89);Muscle weakness (generalized) (M62.81)     Time: 3235-5732 PT Time Calculation (min) (ACUTE ONLY): 29 min  Charges:  $Therapeutic Exercise: 8-22 mins $Therapeutic Activity: 8-22 mins                     Rachel [name],PT Acute Rehab Services [phone] (pager)   Rachel [name] 07/05/2020, 4:23 PM

## 2020-07-05 NOTE — Progress Notes (Signed)
NAMEAriza [name], MRN:  [id number], DOB:  [birth year], LOS: 35 ADMISSION DATE:  05/31/2020, CONSULTATION DATE: 06/15/2020 REFERRING MD: [facility], CHIEF COMPLAINT: Acute respiratory failure secondary to Covid with subsequent possible acquired pneumonia   Brief History:  65 yo female former smoker presented to [facility] on 05/31/20 with one week of progressive dyspnea from upper respiratory tract infection.  Found to have severe hypoxia with SpO2 50%.  She hadn't received COVID 19 vaccination.  She was treated for COVID 19 pneumonia with steroids, remdesivir and tocilizumab on 2/19.  She was started on ABx for possible HCAP.  She ultimately required intubation and transferred to [facility].  Pneumococcal urine antigen positive.  Past Medical History:  GERD, HLD, Nephrolithiasis  Significant Hospital Events:  2/18 Admit to [facility], start steroids and remdesivir, treated with tocilizumab 3/03 increasing O2 requirements, started on Bipap 3/05 intubated, transferred to [facility] 3/06 Lt pneumothorax >> pig tail catheter placed 3/07 hypoxia/hypotension/vent dys-synchrony >> improved after chest tube flushed 3/8 d/c steroids, start HCO3 gtt 3/10 d/c HCO3 gtt, off pressors 3/11-3/13 Diuresis 3/19 persistent fever, change ABx, transfuse 1 unit PRBC 3/22 tracheostomy  Consults:    Procedures:  ETT 3/05 >> 3/22 Lt IJ CVL 3/06 >> Lt pig tail catheter 3/06 >> Trach 3/22 >>   Significant Diagnostic Tests:   CT chest 2/18 >> patchy b/l ASD  Doppler legs b/l 2/21 >> no DVT, 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  No clear air-fluid levels.  Left side pigtail in place without pneumothorax  Micro Data:  COVID 2/18 >> positive MRSA PCR 3/05 >> negative Blood 3/05 >> negative Pneumococcal urine Ag 3/05 >> positive Sputum 3/17 >> Rare Candida albicans Sputum  3/19 >> Rare Candida albicans  Antimicrobials:  Vancomycin 3/05 >> 3/07 Zosyn 3/05>> off  Cefepime 3/05 >> 3/07 Rocephin 3/07 >> 3/12, 3/16>>> 3/19 Linezolid 3/19 >>3/25 Elita Quick 3/19 >> 3/25  Interim History / Subjective:  Hypokalemic  coretrak placed, back on TF  No sig change overnight  Awake, follows some commands but seems confused Failed SBT quickly - low Vt, rapid RR    Objective   Blood pressure (!) 161/86, pulse (!) 101, temperature (!) 100.76 F (38.2 C), resp. rate (!) 34, height 5\' 4"  (1.626 m), weight 83.2 kg, SpO2 94 %.    Vent Mode: PRVC FiO2 (%):  [60 %-65 %] 65 % Set Rate:  [26 bmp] 26 bmp Vt Set:  [420 mL] 420 mL PEEP:  [5 cmH20] 5 cmH20 Plateau Pressure:  [14 cmH20-25 cmH20] 14 cmH20   Intake/Output Summary (Last 24 hours) at 07/05/2020 1014 Last data filed at 07/05/2020 0900 Gross per 24 hour  Intake 2130.69 ml  Output 2965 ml  Net -834.31 ml   Filed Weights   07/02/20 0409 07/03/20 0500 07/05/20 0500  Weight: 80.2 kg 84.2 kg 83.2 kg   Physical Exam: General: Chronically ill, ventilated HEENT: Trach clean and dry, oropharynx moist, no secretions Neuro: Awake, alert, nods to questions, follows commands, generalized weakness CV: Regular, distant, no murmur PULM: Tachypneic, comfortable respiratory pattern, bilateral inspiratory crackles.  No air leak on chest tube GI: Soft, nondistended, positive bowel sounds Extremities: 1+ lower extremity edema Skin: No rash  CXR reviewed 3/25 large right basilar pneumatocele, diffuse infiltrates especially on the left with left basilar volume loss.  No left pneumothorax, chest tube in place   [facility]  Problem list   Non gap metabolic acidosis, Elevated LFTs, Hypotension from meds, Septic shock, AKI from ATN 2nd to sepsis, Thrombocytopenia from sepsis, Hypernatremia, Constipation  Assessment & Plan:   Acute hypoxic/hypercapnic respiratory failure from COVID 19 pneumonia with ARDS complicated by  Pneumococcal bacterial pneumonia, left pneumothorax. -Progressive pneumatoceles CT chest 3/15 with fever.  Ceftriaxone started 3/16 and then antibiotics expanded 3/19.  Linezolid and ceftazidime plan to and 3/25 -Liberalize tidal volumes to PRVC 8 cc/kg. -We will increase PEEP back to 8, hopefully will allow [place] to wean FiO2 which increased from 0.50 > 0.65 when PEEP was decreased -Plan to remove chest tube today 3/25 -Follow chest x-ray -Repeat diuresis 3/25 -She should be ready for LTAC by next week.  Discussed with her husband at bedside 3/25  Lt pneumothorax. -Plan to discontinue chest tube 3/25 -Follow chest x-ray  Acute metabolic encephalopathy 2nd to hypoxia/hypercapnia, uremic encepahalopathy.  Much improved -RASS goal 0 -Fentanyl patch -Oxycodone per tube, fentanyl as needed  Anemia of critical illness.. -Prophylactic Lovenox per pharmacy -Follow CBC -Transfusion goal 7.0  Hyperglycemia. Now hypoglycemia with interruption of tube feeding 3/22 -Sliding-scale insulin as ordered -Lantus on hold.  May need to restart depending on CBG trend.  Hold off for now  Hx of HLD. -Simvastatin   Best practice (evaluated daily)  Diet: tube feeds DVT prophylaxis: Enoxaparin GI prophylaxis: Pantoprazole Mobility: Bedrest Disposition: ICU  Code Status: Full Family: Spoke with the patient's husband at bedside 3/25   Labs    CMP Latest Ref Rng & Units 07/05/2020 07/04/2020 07/03/2020  Glucose 70 - 99 mg/dL 161(W) 960(A) 540(J)  BUN 8 - 23 mg/dL 81(X) 91(Y) 78(G)  Creatinine 0.44 - 1.00 mg/dL 9.56 2.13 0.86  Sodium 135 - 145 mmol/L 142 142 138  Potassium 3.5 - 5.1 mmol/L 4.1 3.0(L) 3.2(L)  Chloride 98 - 111 mmol/L 101 102 99  CO2 22 - 32 mmol/L 35(H) 31 32  Calcium 8.9 - 10.3 mg/dL 5.7(Q) 7.6(L) 6.9(L)  Total Protein 6.5 - 8.1 g/dL - - -  Total Bilirubin 0.3 - 1.2 mg/dL - - -  Alkaline Phos 38 - 126 U/L - - -  AST 15 - 41 U/L - - -  ALT 0 - 44 U/L - - -    CBC Latest Ref  Rng & Units 07/05/2020 07/04/2020 07/03/2020  WBC 4.0 - 10.5 K/uL 7.2 7.1 9.3  Hemoglobin 12.0 - 15.0 g/dL 7.4(L) 7.4(L) 8.0(L)  Hematocrit 36.0 - 46.0 % 24.2(L) 23.3(L) 26.1(L)  Platelets 150 - 400 K/uL 175 168 194    ABG    Component Value Date/Time   PHART 7.404 06/26/2020 0908   PCO2ART 64.7 (H) 06/26/2020 0908   PO2ART 57 (L) 06/26/2020 0908   HCO3 40.1 (H) 06/26/2020 0908   TCO2 42 (H) 06/26/2020 0908   ACIDBASEDEF 2.9 (H) 06/19/2020 0332   O2SAT 86.0 06/26/2020 0908    CBG (last 3)  Recent Labs    07/04/20 2349 07/05/20 0332 07/05/20 0749  GLUCAP 147* 168* 163*    Signature:   Independent critical care time 33 minutes  Levy Pupa, MD, PhD 07/05/2020, 10:48 AM [facility] [phone] or if no answer before 7:00PM call [phone] For any issues after 7:00PM please call eLink [phone]

## 2020-07-06 ENCOUNTER — Inpatient Hospital Stay (HOSPITAL_COMMUNITY): Payer: BLUE CROSS/BLUE SHIELD

## 2020-07-06 ENCOUNTER — Inpatient Hospital Stay: Payer: Self-pay

## 2020-07-06 DIAGNOSIS — Z930 Tracheostomy status: Secondary | ICD-10-CM | POA: Diagnosis not present

## 2020-07-06 DIAGNOSIS — E876 Hypokalemia: Secondary | ICD-10-CM | POA: Diagnosis not present

## 2020-07-06 DIAGNOSIS — J9601 Acute respiratory failure with hypoxia: Secondary | ICD-10-CM | POA: Diagnosis not present

## 2020-07-06 DIAGNOSIS — U071 COVID-19: Secondary | ICD-10-CM | POA: Diagnosis not present

## 2020-07-06 LAB — BASIC METABOLIC PANEL
Anion gap: 6 (ref 5–15)
BUN: 39 mg/dL — ABNORMAL HIGH (ref 8–23)
CO2: 36 mmol/L — ABNORMAL HIGH (ref 22–32)
Calcium: 8.7 mg/dL — ABNORMAL LOW (ref 8.9–10.3)
Chloride: 100 mmol/L (ref 98–111)
Creatinine, Ser: 0.68 mg/dL (ref 0.44–1.00)
GFR, Estimated: >60 mL/min (ref >60)
Glucose, Bld: 154 mg/dL — ABNORMAL HIGH (ref 70–99)
Potassium: 3.7 mmol/L (ref 3.5–5.1)
Sodium: 142 mmol/L (ref 135–145)

## 2020-07-06 LAB — CBC
HCT: 25.3 % — ABNORMAL LOW (ref 36.0–46.0)
Hemoglobin: 7.6 g/dL — ABNORMAL LOW (ref 12.0–15.0)
MCH: 28.6 pg (ref 26.0–34.0)
MCHC: 30 g/dL (ref 30.0–36.0)
MCV: 95.1 fL (ref 80.0–100.0)
Platelets: 175 10*3/uL (ref 150–400)
RBC: 2.66 MIL/uL — ABNORMAL LOW (ref 3.87–5.11)
RDW: 16.9 % — ABNORMAL HIGH (ref 11.5–15.5)
WBC: 8.5 10*3/uL (ref 4.0–10.5)
nRBC: 0.7 % — ABNORMAL HIGH (ref 0.0–0.2)

## 2020-07-06 LAB — GLUCOSE, CAPILLARY
Glucose-Capillary: 147 mg/dL — ABNORMAL HIGH (ref 70–99)
Glucose-Capillary: 157 mg/dL — ABNORMAL HIGH (ref 70–99)
Glucose-Capillary: 158 mg/dL — ABNORMAL HIGH (ref 70–99)
Glucose-Capillary: 158 mg/dL — ABNORMAL HIGH (ref 70–99)
Glucose-Capillary: 161 mg/dL — ABNORMAL HIGH (ref 70–99)
Glucose-Capillary: 162 mg/dL — ABNORMAL HIGH (ref 70–99)
Glucose-Capillary: 165 mg/dL — ABNORMAL HIGH (ref 70–99)

## 2020-07-06 LAB — MAGNESIUM: Magnesium: 2.1 mg/dL (ref 1.7–2.4)

## 2020-07-06 MED ORDER — SODIUM CHLORIDE 0.9% FLUSH: 10.0000 mL | INTRAVENOUS | Status: DC | PRN

## 2020-07-06 MED ORDER — BETHANECHOL CHLORIDE 10 MG PO TABS
10.0000 mg | ORAL_TABLET | Freq: Three times a day (TID) | ORAL | Status: DC
  Administered 2020-07-06 – 2020-07-09 (×11): 10 mg
  Filled 2020-07-06 (×12): qty 1

## 2020-07-06 MED ORDER — MELATONIN 3 MG PO TABS
3.0000 mg | ORAL_TABLET | Freq: Every evening | ORAL | Status: DC | PRN
Start: 2020-07-06 — End: 2020-07-09
  Administered 2020-07-06 – 2020-07-08 (×3): 3 mg
  Filled 2020-07-06 (×3): qty 1

## 2020-07-06 MED ORDER — SODIUM CHLORIDE 0.9% FLUSH
10.0000 mL | Freq: Two times a day (BID) | INTRAVENOUS | Status: DC
  Administered 2020-07-06: 10 mL
  Administered 2020-07-07: 20 mL
  Administered 2020-07-07 – 2020-07-09 (×4): 10 mL

## 2020-07-06 MED ORDER — CLONIDINE HCL 0.1 MG PO TABS
0.1000 mg | ORAL_TABLET | Freq: Three times a day (TID) | ORAL | Status: DC
  Administered 2020-07-06 – 2020-07-09 (×11): 0.1 mg
  Filled 2020-07-06 (×11): qty 1

## 2020-07-06 MED ORDER — FUROSEMIDE 10 MG/ML IJ SOLN
40.0000 mg | Freq: Two times a day (BID) | INTRAMUSCULAR | Status: AC
  Administered 2020-07-06 (×2): 40 mg via INTRAVENOUS
  Filled 2020-07-06 (×2): qty 4

## 2020-07-06 MED ORDER — ACETAZOLAMIDE SODIUM 500 MG IJ SOLR
500.0000 mg | Freq: Once | INTRAMUSCULAR | Status: AC
  Administered 2020-07-06: 500 mg via INTRAVENOUS
  Filled 2020-07-06: qty 500

## 2020-07-06 NOTE — Progress Notes (Signed)
Peripherally Inserted Central Catheter Placement  The IV Nurse has discussed with the patient and/or persons authorized to consent for the patient, the purpose of this procedure and the potential benefits and risks involved with this procedure.  The benefits include less needle sticks, lab draws from the catheter, and the patient may be discharged home with the catheter. Risks include, but not limited to, infection, bleeding, blood clot (thrombus formation), and puncture of an artery; nerve damage and irregular heartbeat and possibility to perform a PICC exchange if needed/ordered by physician.  Alternatives to this procedure were also discussed.  Bard Power PICC patient education guide, fact sheet on infection prevention and patient information card has been provided to patient /or left at bedside.  Consent obtained from husband due to altered mental status.  PICC Placement Documentation  PICC Double Lumen 07/06/20 PICC Right Brachial 38 cm 0 cm (Active)  Indication for Insertion or Continuance of Line Prolonged intravenous therapies;Chronic illness with exacerbations (CF, Sickle Cell, etc.) 07/06/20 1507  Exposed Catheter (cm) 0 cm 07/06/20 1507  Site Assessment Clean;Dry;Intact 07/06/20 1507  Lumen #1 Status Flushed;Saline locked;Blood return noted 07/06/20 1507  Lumen #2 Status Flushed;Saline locked;Blood return noted 07/06/20 1507  Dressing Type Transparent 07/06/20 1507  Dressing Status Clean;Dry;Intact 07/06/20 1507  Antimicrobial disc in place? Yes 07/06/20 1507  Safety Lock Not Applicable 07/06/20 1507  Line Care Connections checked and tightened 07/06/20 1507  Line Adjustment (NICU/IV Team Only) No 07/06/20 1507  Dressing Intervention New dressing 07/06/20 1507  Dressing Change Due 07/13/20 07/06/20 1507       Cothren, Lajean Manes 07/06/2020, 3:08 PM

## 2020-07-06 NOTE — Progress Notes (Signed)
Telephone consent obtained from husband for PICC. 

## 2020-07-06 NOTE — Progress Notes (Signed)
eLink Physician-Brief Progress Note Patient Name: Rachel [name] DOB: 1955/11/05 MRN: [id number]   Date of Service  07/06/2020  HPI/Events of Note  Patient is requesting a sleep aid.  eICU Interventions  Melatonin 3 mg via tube Q HS PRN insomnia x 5 doses.        Thomasene Lot Lichelle Viets 07/06/2020, 12:23 AM

## 2020-07-06 NOTE — Progress Notes (Signed)
NAMEJaslynn [name], MRN:  [id number], DOB:  May 14, 1955, LOS: 36 ADMISSION DATE:  05/31/2020, CONSULTATION DATE: 06/15/2020 REFERRING MD: [facility], CHIEF COMPLAINT: Acute respiratory failure secondary to Covid with subsequent possible acquired pneumonia   Brief History:  65 yo female former smoker presented to [facility] on 05/31/20 with one week of progressive dyspnea from upper respiratory tract infection.  Found to have severe hypoxia with SpO2 50%.  She hadn't received COVID 19 vaccination.  She was treated for COVID 19 pneumonia with steroids, remdesivir and tocilizumab on 2/19.  She was started on ABx for possible HCAP.  She ultimately required intubation and transferred to [facility].  Pneumococcal urine antigen positive.  Past Medical History:  GERD, HLD, Nephrolithiasis  Significant Hospital Events:  2/18 Admit to [facility], start steroids and remdesivir, treated with tocilizumab 3/03 increasing O2 requirements, started on Bipap 3/05 intubated, transferred to [facility] 3/06 Lt pneumothorax >> pig tail catheter placed 3/07 hypoxia/hypotension/vent dys-synchrony >> improved after chest tube flushed 3/8 d/c steroids, start HCO3 gtt 3/10 d/c HCO3 gtt, off pressors 3/11-3/13 Diuresis 3/19 persistent fever, change ABx, transfuse 1 unit PRBC 3/22 tracheostomy  Consults:    Procedures:  ETT 3/05 >> 3/22 Lt IJ CVL 3/06 >> Lt pig tail catheter 3/06 >> Trach 3/22 >>   Significant Diagnostic Tests:   CT chest 2/18 >> patchy b/l ASD  Doppler legs b/l 2/21 >> no DVT, 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  No clear air-fluid levels.  Left side pigtail in place without pneumothorax  Micro Data:  COVID 2/18 >> positive MRSA PCR 3/05 >> negative Blood 3/05 >> negative Pneumococcal urine Ag 3/05 >> positive Sputum 3/17 >> Rare Candida albicans Sputum  3/19 >> Rare Candida albicans  Antimicrobials:  Vancomycin 3/05 >> 3/07 Zosyn 3/05>> off  Cefepime 3/05 >> 3/07 Rocephin 3/07 >> 3/12, 3/16>>> 3/19 Linezolid 3/19 >>3/25 Elita Quick 3/19 >> 3/25  Interim History / Subjective:  Patient remains on FiO2 of 60% and PEEP of 8, unable to titrate down due to hypoxia Continue to have urinary retention, requiring multiple straight cath   Objective   Blood pressure (!) 161/93, pulse (!) 110, temperature 99.2 F (37.3 C), temperature source Oral, resp. rate (!) 33, height 5\' 4"  (1.626 m), weight 83.2 kg, SpO2 95 %.    Vent Mode: PRVC FiO2 (%):  [60 %] 60 % Set Rate:  [22 bmp-26 bmp] 22 bmp Vt Set:  [430 mL-440 mL] 430 mL PEEP:  [8 cmH20] 8 cmH20 Plateau Pressure:  [24 cmH20-32 cmH20] 26 cmH20   Intake/Output Summary (Last 24 hours) at 07/06/2020 1029 Last data filed at 07/06/2020 1004 Gross per 24 hour  Intake 2340.66 ml  Output 3695 ml  Net -1354.34 ml   Filed Weights   07/02/20 0409 07/03/20 0500 07/05/20 0500  Weight: 80.2 kg 84.2 kg 83.2 kg   Physical Exam: General: Chronically ill appearing female, s/p trach, on mechanical ventilator HEENT: Trach clean and dry, oropharynx moist, no secretions Neuro: Awake, alert, nods to questions, follows commands, generalized weakness CV: Regular, distant, no murmur PULM: Tachypneic, comfortable respiratory pattern, bilateral inspiratory crackles.  No air leak on chest tube GI: Soft, nondistended, positive bowel sounds Extremities: 1+ lower extremity edema Skin: No rash   Resolved Hospital Problem list   Non gap metabolic acidosis, Elevated LFTs, Hypotension from meds, Septic shock, AKI from ATN 2nd to sepsis,  Thrombocytopenia from sepsis, Hypernatremia, Constipation  Assessment & Plan:   Acute hypoxic/hypercapnic respiratory failure from COVID 19 pneumonia with ARDS complicated by Pneumococcal bacterial pneumonia, left pneumothorax. Remain on mechanical ventilator, unable to titrate down  FiO2 due to hypoxia Completed treatment for pneumococcal bacteremia X-ray chest continues to show pneumatocele Continue to titrate FiO2 to maintain O2 sat >88% Left-sided chest tube was removed yesterday Repeat x-ray chest did not show reaccumulation of pneumothorax Continue diuresis  Lt pneumothorax. Chest tube was removed yesterday Repeat x-ray chest shows stability  Acute metabolic encephalopathy 2nd to hypoxia/hypercapnia, uremic encepahalopathy.  Much improved RASS goal 0 Fentanyl patch Oxycodone per tube Started on clonidine for possible opiate withdrawal  Anemia of critical illness.. H&H is stable, continue to monitor   Best practice (evaluated daily)  Diet: tube feeds DVT prophylaxis: Enoxaparin GI prophylaxis: Pantoprazole Mobility: Bedrest Disposition: ICU  Code Status: Full  Labs    CMP Latest Ref Rng & Units 07/06/2020 07/05/2020 07/04/2020  Glucose 70 - 99 mg/dL 810(F) 751(W) 258(N)  BUN 8 - 23 mg/dL 27(P) 82(U) 23(N)  Creatinine 0.44 - 1.00 mg/dL 3.61 4.43 1.54  Sodium 135 - 145 mmol/L 142 142 142  Potassium 3.5 - 5.1 mmol/L 3.7 4.1 3.0(L)  Chloride 98 - 111 mmol/L 100 101 102  CO2 22 - 32 mmol/L 36(H) 35(H) 31  Calcium 8.9 - 10.3 mg/dL 0.0(Q) 6.7(Y) 7.6(L)  Total Protein 6.5 - 8.1 g/dL - - -  Total Bilirubin 0.3 - 1.2 mg/dL - - -  Alkaline Phos 38 - 126 U/L - - -  AST 15 - 41 U/L - - -  ALT 0 - 44 U/L - - -    CBC Latest Ref Rng & Units 07/06/2020 07/05/2020 07/04/2020  WBC 4.0 - 10.5 K/uL 8.5 7.2 7.1  Hemoglobin 12.0 - 15.0 g/dL 7.6(L) 7.4(L) 7.4(L)  Hematocrit 36.0 - 46.0 % 25.3(L) 24.2(L) 23.3(L)  Platelets 150 - 400 K/uL 175 175 168    ABG    Component Value Date/Time   PHART 7.404 06/26/2020 0908   PCO2ART 64.7 (H) 06/26/2020 0908   PO2ART 57 (L) 06/26/2020 0908   HCO3 40.1 (H) 06/26/2020 0908   TCO2 42 (H) 06/26/2020 0908   ACIDBASEDEF 2.9 (H) 06/19/2020 0332   O2SAT 86.0 06/26/2020 0908    CBG (last 3)  Recent Labs    07/06/20 0020  07/06/20 0335 07/06/20 0803  GLUCAP 158* 157* 158*    Total critical care time: 39 minutes  Performed by: Cheri Fowler   Critical care time was exclusive of separately billable procedures and treating other patients.   Critical care was necessary to treat or prevent imminent or life-threatening deterioration.   Critical care was time spent personally by me on the following activities: development of treatment plan with patient and/or surrogate as well as nursing, discussions with consultants, evaluation of patient's response to treatment, examination of patient, obtaining history from patient or surrogate, ordering and performing treatments and interventions, ordering and review of laboratory studies, ordering and review of radiographic studies, pulse oximetry and re-evaluation of patient's condition.   Cheri Fowler MD [facility] See Amion for pager If no response to pager, please call [phone] until 7pm After 7pm, Please call E-link [phone]\

## 2020-07-07 DIAGNOSIS — U071 COVID-19: Secondary | ICD-10-CM | POA: Diagnosis not present

## 2020-07-07 DIAGNOSIS — J9601 Acute respiratory failure with hypoxia: Secondary | ICD-10-CM | POA: Diagnosis not present

## 2020-07-07 DIAGNOSIS — J1282 Pneumonia due to coronavirus disease 2019: Secondary | ICD-10-CM | POA: Diagnosis not present

## 2020-07-07 DIAGNOSIS — J9312 Secondary spontaneous pneumothorax: Secondary | ICD-10-CM

## 2020-07-07 DIAGNOSIS — J9602 Acute respiratory failure with hypercapnia: Secondary | ICD-10-CM

## 2020-07-07 LAB — CBC
HCT: 24.9 % — ABNORMAL LOW (ref 36.0–46.0)
Hemoglobin: 7.5 g/dL — ABNORMAL LOW (ref 12.0–15.0)
MCH: 28.7 pg (ref 26.0–34.0)
MCHC: 30.1 g/dL (ref 30.0–36.0)
MCV: 95.4 fL (ref 80.0–100.0)
Platelets: 175 10*3/uL (ref 150–400)
RBC: 2.61 MIL/uL — ABNORMAL LOW (ref 3.87–5.11)
RDW: 17.2 % — ABNORMAL HIGH (ref 11.5–15.5)
WBC: 8.9 10*3/uL (ref 4.0–10.5)
nRBC: 0.7 % — ABNORMAL HIGH (ref 0.0–0.2)

## 2020-07-07 LAB — BASIC METABOLIC PANEL
Anion gap: 8 (ref 5–15)
BUN: 49 mg/dL — ABNORMAL HIGH (ref 8–23)
CO2: 37 mmol/L — ABNORMAL HIGH (ref 22–32)
Calcium: 8.7 mg/dL — ABNORMAL LOW (ref 8.9–10.3)
Chloride: 98 mmol/L (ref 98–111)
Creatinine, Ser: 0.72 mg/dL (ref 0.44–1.00)
GFR, Estimated: >60 mL/min (ref >60)
Glucose, Bld: 179 mg/dL — ABNORMAL HIGH (ref 70–99)
Potassium: 3.3 mmol/L — ABNORMAL LOW (ref 3.5–5.1)
Sodium: 143 mmol/L (ref 135–145)

## 2020-07-07 LAB — GLUCOSE, CAPILLARY
Glucose-Capillary: 142 mg/dL — ABNORMAL HIGH (ref 70–99)
Glucose-Capillary: 152 mg/dL — ABNORMAL HIGH (ref 70–99)
Glucose-Capillary: 160 mg/dL — ABNORMAL HIGH (ref 70–99)
Glucose-Capillary: 165 mg/dL — ABNORMAL HIGH (ref 70–99)
Glucose-Capillary: 182 mg/dL — ABNORMAL HIGH (ref 70–99)
Glucose-Capillary: 182 mg/dL — ABNORMAL HIGH (ref 70–99)

## 2020-07-07 LAB — MAGNESIUM: Magnesium: 2.1 mg/dL (ref 1.7–2.4)

## 2020-07-07 LAB — PHOSPHORUS: Phosphorus: 4.5 mg/dL (ref 2.5–4.6)

## 2020-07-07 MED ORDER — STERILE WATER FOR INJECTION IJ SOLN
INTRAMUSCULAR | Status: AC
  Filled 2020-07-07: qty 10

## 2020-07-07 MED ORDER — FUROSEMIDE 10 MG/ML IJ SOLN
40.0000 mg | Freq: Two times a day (BID) | INTRAMUSCULAR | Status: AC
  Administered 2020-07-07 (×2): 40 mg via INTRAVENOUS
  Filled 2020-07-07 (×2): qty 4

## 2020-07-07 MED ORDER — ACETAZOLAMIDE SODIUM 500 MG IJ SOLR
500.0000 mg | Freq: Once | INTRAMUSCULAR | Status: AC
  Administered 2020-07-07: 500 mg via INTRAVENOUS
  Filled 2020-07-07: qty 500

## 2020-07-07 MED ORDER — POTASSIUM CHLORIDE 20 MEQ PO PACK
40.0000 meq | PACK | ORAL | Status: AC
  Administered 2020-07-07 (×2): 40 meq
  Filled 2020-07-07 (×2): qty 2

## 2020-07-07 MED ORDER — POTASSIUM CHLORIDE 10 MEQ/100ML IV SOLN: 10.0000 meq | INTRAVENOUS | Status: DC

## 2020-07-07 NOTE — Progress Notes (Signed)
NAMEChaelyn [name], MRN:  [id number], DOB:  1955/10/08, LOS: 37 ADMISSION DATE:  05/31/2020, CONSULTATION DATE: 06/15/2020 REFERRING MD: [facility], CHIEF COMPLAINT: Acute respiratory failure secondary to Covid with subsequent possible acquired pneumonia   Brief History:  65 yo female former smoker presented to [facility] on 05/31/20 with one week of progressive dyspnea from upper respiratory tract infection.  Found to have severe hypoxia with SpO2 50%.  She hadn't received COVID 19 vaccination.  She was treated for COVID 19 pneumonia with steroids, remdesivir and tocilizumab on 2/19.  She was started on ABx for possible HCAP.  She ultimately required intubation and transferred to [facility].  Pneumococcal urine antigen positive.  Past Medical History:  GERD, HLD, Nephrolithiasis  Significant Hospital Events:  2/18 Admit to [facility], start steroids and remdesivir, treated with tocilizumab 3/03 increasing O2 requirements, started on Bipap 3/05 intubated, transferred to [facility] 3/06 Lt pneumothorax >> pig tail catheter placed 3/07 hypoxia/hypotension/vent dys-synchrony >> improved after chest tube flushed 3/8 d/c steroids, start HCO3 gtt 3/10 d/c HCO3 gtt, off pressors 3/11-3/13 Diuresis 3/19 persistent fever, change ABx, transfuse 1 unit PRBC 3/22 tracheostomy  Consults:    Procedures:  ETT 3/05 >> 3/22 Lt IJ CVL 3/06 >> Lt pig tail catheter 3/06 >> Trach 3/22 >>   Significant Diagnostic Tests:   CT chest 2/18 >> patchy b/l ASD  Doppler legs b/l 2/21 >> no DVT, 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  No clear air-fluid levels.  Left side pigtail in place without pneumothorax  Micro Data:  COVID 2/18 >> positive MRSA PCR 3/05 >> negative Blood 3/05 >> negative Pneumococcal urine Ag 3/05 >> positive Sputum 3/17 >> Rare Candida albicans Sputum  3/19 >> Rare Candida albicans  Antimicrobials:  Vancomycin 3/05 >> 3/07 Zosyn 3/05>> off  Cefepime 3/05 >> 3/07 Rocephin 3/07 >> 3/12, 3/16>>> 3/19 Linezolid 3/19 >>3/25 Elita Quick 3/19 >> 3/25  Interim History / Subjective:  FiO2 was titrated down to 50%, patient continued to retain urine, after multiple straight catheterization, Foley catheter was placed   Objective   Blood pressure 100/64, pulse (!) 106, temperature 99.5 F (37.5 C), temperature source Oral, resp. rate (!) 29, height 5\' 4"  (1.626 m), weight 81 kg, SpO2 93 %.    Vent Mode: PRVC FiO2 (%):  [40 %-50 %] 50 % Set Rate:  [22 bmp] 22 bmp Vt Set:  [430 mL] 430 mL PEEP:  [8 cmH20] 8 cmH20 Pressure Support:  [12 cmH20] 12 cmH20 Plateau Pressure:  [27 cmH20-32 cmH20] 27 cmH20   Intake/Output Summary (Last 24 hours) at 07/07/2020 0934 Last data filed at 07/07/2020 0900 Gross per 24 hour  Intake 1795 ml  Output 3050 ml  Net -1255 ml   Filed Weights   07/03/20 0500 07/05/20 0500 07/07/20 0500  Weight: 84.2 kg 83.2 kg 81 kg   Physical Exam: General: Chronically ill appearing female, s/p trach, on mechanical ventilator HEENT: Trach clean and dry, oropharynx moist, no secretions Neuro: Awake, alert, nods to questions, follows commands, generalized weakness CV: Tachycardic, regular rhythm, no murmur PULM: Tachypneic, comfortable respiratory pattern, bilateral inspiratory crackles.  No air leak on chest tube GI: Soft, nondistended, positive bowel sounds Extremities: 1+ lower extremity edema Skin: No rash   Resolved Hospital Problem list   Non gap metabolic acidosis, Elevated LFTs, Hypotension from meds, Septic shock, AKI from ATN 2nd to sepsis, Thrombocytopenia  from sepsis, Hypernatremia, Constipation  Assessment & Plan:   Acute hypoxic/hypercapnic respiratory failure from COVID 19 pneumonia with ARDS complicated by Pneumococcal bacterial pneumonia, left pneumothorax. Remain on mechanical ventilator, slowly titrate  down FiO2 due to hypoxia with O2 sat goal 88-92% Completed treatment for pneumococcal bacteremia X-ray chest continues to show pneumatocele Left-sided chest tube was removed on 3/25 Repeat x-ray chest did not show reaccumulation of pneumothorax Continue diuresis with Lasix and acetazolamide  Lt pneumothorax. Chest tube was removed yesterday Repeat x-ray chest shows stability  Acute metabolic encephalopathy 2nd to hypoxia/hypercapnia, uremic encepahalopathy.  Much improved RASS goal 0 Fentanyl patch IV fentanyl was DC'd Oxycodone per tube Continue clonidine for possible opiate withdrawal  Anemia of critical illness.. H&H is stable, continue to monitor  Acute urinary retention A started on Urecholine Foley catheter was placed, will give voiding trial in 24 hours  Hypokalemia Patient serum potassium is 3.3, supplement and repeat level   Best practice (evaluated daily)  Diet: tube feeds DVT prophylaxis: Enoxaparin GI prophylaxis: Pantoprazole Mobility: Bedrest Disposition: ICU  Code Status: Full  Labs    CMP Latest Ref Rng & Units 07/07/2020 07/06/2020 07/05/2020  Glucose 70 - 99 mg/dL 272(Z) 366(Y) 403(K)  BUN 8 - 23 mg/dL 74(Q) 59(D) 63(O)  Creatinine 0.44 - 1.00 mg/dL 7.56 4.33 2.95  Sodium 135 - 145 mmol/L 143 142 142  Potassium 3.5 - 5.1 mmol/L 3.3(L) 3.7 4.1  Chloride 98 - 111 mmol/L 98 100 101  CO2 22 - 32 mmol/L 37(H) 36(H) 35(H)  Calcium 8.9 - 10.3 mg/dL 1.8(A) 4.1(Y) 6.0(Y)  Total Protein 6.5 - 8.1 g/dL - - -  Total Bilirubin 0.3 - 1.2 mg/dL - - -  Alkaline Phos 38 - 126 U/L - - -  AST 15 - 41 U/L - - -  ALT 0 - 44 U/L - - -    CBC Latest Ref Rng & Units 07/07/2020 07/06/2020 07/05/2020  WBC 4.0 - 10.5 K/uL 8.9 8.5 7.2  Hemoglobin 12.0 - 15.0 g/dL 7.5(L) 7.6(L) 7.4(L)  Hematocrit 36.0 - 46.0 % 24.9(L) 25.3(L) 24.2(L)  Platelets 150 - 400 K/uL 175 175 175    ABG    Component Value Date/Time   PHART 7.404 06/26/2020 0908   PCO2ART 64.7 (H) 06/26/2020  0908   PO2ART 57 (L) 06/26/2020 0908   HCO3 40.1 (H) 06/26/2020 0908   TCO2 42 (H) 06/26/2020 0908   ACIDBASEDEF 2.9 (H) 06/19/2020 0332   O2SAT 86.0 06/26/2020 0908    CBG (last 3)  Recent Labs    07/06/20 2310 07/07/20 0307 07/07/20 0756  GLUCAP 165* 160* 165*    Total critical care time: 33 minutes  Performed by: Cheri Fowler   Critical care time was exclusive of separately billable procedures and treating other patients.   Critical care was necessary to treat or prevent imminent or life-threatening deterioration.   Critical care was time spent personally by me on the following activities: development of treatment plan with patient and/or surrogate as well as nursing, discussions with consultants, evaluation of patient's response to treatment, examination of patient, obtaining history from patient or surrogate, ordering and performing treatments and interventions, ordering and review of laboratory studies, ordering and review of radiographic studies, pulse oximetry and re-evaluation of patient's condition.   Cheri Fowler MD [facility] See Amion for pager If no response to pager, please call [phone] until 7pm After 7pm, Please call E-link [phone]

## 2020-07-08 ENCOUNTER — Inpatient Hospital Stay (HOSPITAL_COMMUNITY): Payer: BLUE CROSS/BLUE SHIELD

## 2020-07-08 DIAGNOSIS — J1282 Pneumonia due to coronavirus disease 2019: Secondary | ICD-10-CM | POA: Diagnosis not present

## 2020-07-08 DIAGNOSIS — J9601 Acute respiratory failure with hypoxia: Secondary | ICD-10-CM | POA: Diagnosis not present

## 2020-07-08 DIAGNOSIS — U071 COVID-19: Secondary | ICD-10-CM | POA: Diagnosis not present

## 2020-07-08 LAB — BASIC METABOLIC PANEL
Anion gap: 5 (ref 5–15)
BUN: 55 mg/dL — ABNORMAL HIGH (ref 8–23)
CO2: 38 mmol/L — ABNORMAL HIGH (ref 22–32)
Calcium: 8.7 mg/dL — ABNORMAL LOW (ref 8.9–10.3)
Chloride: 104 mmol/L (ref 98–111)
Creatinine, Ser: 0.75 mg/dL (ref 0.44–1.00)
GFR, Estimated: >60 mL/min (ref >60)
Glucose, Bld: 125 mg/dL — ABNORMAL HIGH (ref 70–99)
Potassium: 4 mmol/L (ref 3.5–5.1)
Sodium: 147 mmol/L — ABNORMAL HIGH (ref 135–145)

## 2020-07-08 LAB — GLUCOSE, CAPILLARY
Glucose-Capillary: 124 mg/dL — ABNORMAL HIGH (ref 70–99)
Glucose-Capillary: 119 mg/dL — ABNORMAL HIGH (ref 70–99)
Glucose-Capillary: 177 mg/dL — ABNORMAL HIGH (ref 70–99)
Glucose-Capillary: 159 mg/dL — ABNORMAL HIGH (ref 70–99)
Glucose-Capillary: 130 mg/dL — ABNORMAL HIGH (ref 70–99)

## 2020-07-08 MED ORDER — FREE WATER
200.0000 mL | Status: DC
  Administered 2020-07-08 – 2020-07-09 (×8): 200 mL

## 2020-07-08 MED ORDER — OXYCODONE HCL 5 MG PO TABS
5.0000 mg | ORAL_TABLET | Freq: Two times a day (BID) | ORAL | Status: DC
Start: 2020-07-08 — End: 2020-07-09
  Administered 2020-07-08 – 2020-07-09 (×2): 5 mg
  Filled 2020-07-08 (×2): qty 1

## 2020-07-08 MED ORDER — FUROSEMIDE 10 MG/ML IJ SOLN
40.0000 mg | Freq: Once | INTRAMUSCULAR | Status: AC
  Administered 2020-07-08: 40 mg via INTRAVENOUS
  Filled 2020-07-08: qty 4

## 2020-07-08 NOTE — Progress Notes (Signed)
Pt transported to CT and back to 3M09 without any complications.

## 2020-07-08 NOTE — Progress Notes (Signed)
NAMEKeshawna Dix, MRN:  [id number], DOB:  [birth year], LOS: 38 ADMISSION DATE:  05/31/2020, CONSULTATION DATE:  3/5 REFERRING MD:  Arrien, CHIEF COMPLAINT:  Dyspnea   History of Present Illness:  65 y/o female presented with dyspnea on 2/18 to [facility], had severe hypoxemia, diagnosed with COVID 19 pneumonia.  Unvaccinated.  Treated with tocilizumab, steroids, remdesivir and antibiotics.  Intubated, moved to [facility].  Pneumococcal urine antigen positive.  Tracheostomy performed on 3/22.  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 3/03 increasing O2 requirements, started on Bipap 3/05 intubated, transferred to [facility] ETT 3/05 >> 3/22 Vancomycin 3/05 >> 3/07 Zosyn 3/05>> off  Cefepime 3/05 >> 3/07 3/06 Lt pneumothorax >> pig tail catheter placed Rocephin 3/07 >> 3/12, 3/16>>> 3/19 Lt IJ CVL 3/06 >> 3/26 Lt pig tail catheter 3/06 >> 3/27 L arm PICC 3/26 >> 3/07 hypoxia/hypotension/vent dys-synchrony >> improved after chest tube flushed 3/8 d/c steroids, start HCO3 gtt 3/10 d/c HCO3 gtt, off pressors 3/11-3/13 Diuresis CT chest 3/16 >> diffuse bilateral groundglass and interstitial opacities with multiple thick-walled septated cystic areas consistent with pneumatoceles.  No clear air-fluid levels.  Left side pigtail in place without pneumothorax 3/19 persistent fever, change ABx, transfuse 1 unit PRBC Linezolid 3/19 >>3/25 Fortaz 3/19 >> 3/25 3/22 tracheostomy >> 3/28 speaking valve used 3/28 CT chest >>>  Interim History / Subjective:  Speaking valve in place, she says she feels good but weak  Objective   Blood pressure 105/74, pulse (!) 103,  temperature 99.9 F (37.7 C), temperature source Oral, resp. rate (!) 23, height 5\' 4"  (1.626 m), weight 81 kg, SpO2 98 %.    Vent Mode: PRVC FiO2 (%):  [40 %-50 %] 50 % Set Rate:  [2 bmp-22 bmp] 2 bmp Vt Set:  [430 mL] 430 mL PEEP:  [8 cmH20] 8 cmH20 Plateau Pressure:  [23 cmH20-31 cmH20] 28 cmH20   Intake/Output Summary (Last 24 hours) at 07/08/2020 07/10/2020 Last data filed at 07/08/2020 0600 Gross per 24 hour  Intake 640 ml  Output 2045 ml  Net -1405 ml   Filed Weights   07/03/20 0500 07/05/20 0500 07/07/20 0500  Weight: 84.2 kg 83.2 kg 81 kg    Examination: General:  In bed on vent HENT: NCAT ETT in place tracheostomy in place PULM: CTA B, vent supported breathing CV: RRR, no mgr GI: BS+, soft, nontender MSK: normal bulk and tone Neuro: awake, follows commands, speaking, can move all four extremities but weak    Labs/imaging that I havepersonally reviewed  (right click and "Reselect all SmartList Selections" daily)  COVID 2/18 >> positive MRSA PCR 3/05 >> negative Blood 3/05 >> negative Pneumococcal urine Ag 3/05 >> positive Sputum 3/17 >> Rare Candida albicans Sputum 3/19 >> Rare Candida albicans  Resolved Hospital Problem list     Assessment & Plan:  Acute respiratory failure with hypoxemia due to ARDS from COVID 19 pneumonia Massive cystic changes in right lung CT chest Full mechanical vent support VAP prevention Daily WUA/SBT Continue speaking valve Consider LTACH  Left pneumothorax, resolved, chest tube out Monitor CXR prn  Acute metabolic encephalopathy, uremic encephalopathy > improved Wean down oxycodone today Continue clonidine continue fentanyl  patch Frequent orientation  Anemia of critical illness Monitor for bleeding Transfuse PRBC for Hgb < 7 gm/dL  Acute urinary retention Maintain foley for now urecholine  Hyperglycemia SSI  Hypokalemia Replace     Best practice (right click and "Reselect all SmartList Selections" daily)   Diet:  Tube Feed  Pain/Anxiety/Delirium protocol (if indicated): No VAP protocol (if indicated): Yes DVT prophylaxis: LMWH GI prophylaxis: PPI Glucose control:  SSI Yes Central venous access:  Yes, and it is still needed Arterial line:  N/A Foley:  Yes, and it is still needed Mobility:  OOB  PT consulted: Yes Last date of multidisciplinary goals of care discussion [3/25 Byrum, husband] Code Status:  full code Disposition: remain in ICU, OK for LTAC  Labs   CBC: Recent Labs  Lab 07/03/20 0403 07/04/20 0345 07/05/20 0527 07/06/20 0351 07/07/20 0303  WBC 9.3 7.1 7.2 8.5 8.9  HGB 8.0* 7.4* 7.4* 7.6* 7.5*  HCT 26.1* 23.3* 24.2* 25.3* 24.9*  MCV 93.2 93.6 94.5 95.1 95.4  PLT 194 168 175 175 175    Basic Metabolic Panel: Recent Labs  Lab 07/04/20 0345 07/05/20 0527 07/06/20 0351 07/07/20 0303 07/08/20 0809  NA 142 142 142 143 147*  K 3.0* 4.1 3.7 3.3* 4.0  CL 102 101 100 98 104  CO2 31 35* 36* 37* 38*  GLUCOSE 180* 157* 154* 179* 125*  BUN 34* 35* 39* 49* 55*  CREATININE 0.84 0.73 0.68 0.72 0.75  CALCIUM 7.6* 8.4* 8.7* 8.7* 8.7*  MG 1.8 2.1 2.1 2.1  --   PHOS  --   --   --  4.5  --    GFR: Estimated Creatinine Clearance: 74.1 mL/min (by C-G formula based on SCr of 0.75 mg/dL). Recent Labs  Lab 07/04/20 0345 07/05/20 0527 07/06/20 0351 07/07/20 0303  WBC 7.1 7.2 8.5 8.9    Liver Function Tests: No results for input(s): AST, ALT, ALKPHOS, BILITOT, PROT, ALBUMIN in the last 168 hours. No results for input(s): LIPASE, AMYLASE in the last 168 hours. No results for input(s): AMMONIA in the last 168 hours.  ABG    Component Value Date/Time   PHART 7.404 06/26/2020 0908   PCO2ART 64.7 (H) 06/26/2020 0908   PO2ART 57 (L) 06/26/2020 0908   HCO3 40.1 (H) 06/26/2020 0908   TCO2 42 (H) 06/26/2020 0908   ACIDBASEDEF 2.9 (H) 06/19/2020 0332   O2SAT 86.0 06/26/2020 0908     Coagulation Profile: Recent Labs  Lab 07/02/20 0321  INR 1.2    Cardiac  Enzymes: No results for input(s): CKTOTAL, CKMB, CKMBINDEX, TROPONINI in the last 168 hours.  HbA1C: Hgb A1c MFr Bld  Date/Time Value Ref Range Status  06/16/2020 09:42 AM 6.4 (H) 4.8 - 5.6 % Final    Comment:    (NOTE) Pre diabetes:          5.7%-6.4%  Diabetes:              >6.4%  Glycemic control for   <7.0% adults with diabetes     CBG: Recent Labs  Lab 07/07/20 1559 07/07/20 2012 07/07/20 2334 07/08/20 0412 07/08/20 0720  GLUCAP 142* 152* 182* 124* 119*     Critical care time: 35 minutes     Heber Clark [place], MD [facility] Pager: [phone] Cell: [phone] If no response, please call [phone] until 7pm After 7:00 pm call Elink  [phone]

## 2020-07-08 NOTE — Evaluation (Signed)
Passy-Muir Speaking Valve - Evaluation Patient Details  Name: Rachel [name] MRN: [id number] Date of Birth: 17-Mar-1956  Today's Date: 07/08/2020 Time: 1000-1020 SLP Time Calculation (min) (ACUTE ONLY): 20 min  Past Medical History:  Past Medical History:  Diagnosis Date  . GERD (gastroesophageal reflux disease)   . Hypercholesteremia   . Kidney stones   . Wears dentures    full upper, partial lower   Past Surgical History:  Past Surgical History:  Procedure Laterality Date  . ABDOMINAL HYSTERECTOMY    . BREAST BIOPSY Right   . BREAST EXCISIONAL BIOPSY Right   . COLONOSCOPY    . COLONOSCOPY WITH PROPOFOL N/A 04/22/2015   Procedure: COLONOSCOPY WITH PROPOFOL;  Surgeon: Midge Minium, MD;  Location[facility];  Service: Endoscopy;  Laterality: N/A;  . KIDNEY STONE SURGERY     HPI:  65 y/o female presented with dyspnea on 2/18 to [facility], had severe hypoxemia, diagnosed with COVID 19 pneumonia.  Unvaccinated.  Treated with tocilizumab, steroids, remdesivir and antibiotics.  Intubated 3/5, moved to [facility].  Pneumococcal urine antigen positive. CT chest 3/16 >> diffuse bilateral groundglass and interstitial opacities with multiple thick-walled septated cystic areas consistent with pneumatoceles. Tracheostomy performed on 3/22.   Assessment / Plan / Recommendation Clinical Impression  Pt demonstrates excellent tolerance of PMSV though anxiety did increase through session. Pt alert and cooperative, willing to participate. Repositioned. Pt on PRVC setting, PEEP decreased from 8 to 3, Tidal volume slowly increased from 430 to 700, Pressure down from 28 to 8 back up to 19 gradually over session. RT made further adjustments to trigger sensitivity. Pt with immediately patent upper airway with cuff deflation, no significant secretion mobilization. Phonating easly with PMSV in place. RR initially in the 40s, but with cueing to slow rate, take deeper breaths and then  initaite conversation with family, RR dropped into thirties with SpO2 staying in low 90s. Pt reported anxiety and feeling that she needed more air. Asked to stop after speaking to daughter, MD and husband on the phone. RT returned pts to baseline vent settings with no complications. Will continue efforts about 3x a week. SLP Visit Diagnosis: Aphonia (R49.1)    SLP Assessment  Patient needs continued Speech Lanaguage Pathology Services    Follow Up Recommendations       Frequency and Duration min 2x/week  2 weeks    PMSV Trial PMSV was placed for: 10 minutes Able to redirect subglottic air through upper airway: Yes Able to Attain Phonation: Yes Voice Quality: Breathy;Low vocal intensity Able to Expectorate Secretions: Yes Level of Secretion Expectoration with PMSV: Oral Breath Support for Phonation: Moderately decreased Intelligibility: Intelligible Respirations During Trial: (!) 35 SpO2 During Trial: 91 % Behavior: Alert;Cooperative;Anxious   Tracheostomy Tube       Vent Dependency  Vent Dependent: Yes Vent Mode: PRVC Set Rate: 2 bmp PEEP: 8 cmH20 FiO2 (%): 50 % Vt Set: 430 mL    Cuff Deflation Trial  GO Harlon Ditty, MA CCC-SLP  Acute Rehabilitation Services Pager [phone] Office [phone]  Tolerated Cuff Deflation: Yes        DeBlois, Riley Nearing 07/08/2020, 10:45 AM

## 2020-07-08 NOTE — Progress Notes (Signed)
PMV in line trial done with patient. Pt tolerated fairly well. Vitals were stable throughout. RT will continue to monitor.

## 2020-07-08 NOTE — Progress Notes (Signed)
Physical Therapy Treatment Patient Details Name: Rachel [name] MRN: [id number] DOB: [birth year] Today's Date: 07/08/2020    History of Present Illness Pt adm 2/18 to [facility] with acute hypoxic respiratory failure due to Covid PNA. Intubated 3/5 and transferred to [facility]. Pt with lt pneumothorax 3/6 - chest tube placed. Chest tube removed 3/25. Trach placed 3/22. PMH - kidney stones, smoker, obesity.    PT Comments    Pt making slow, steady progress. Respiratory status is limiting factor. Kreg tilt bed ordered for pt.    Follow Up Recommendations  LTACH     Equipment Recommendations  Other (comment) (to be determined)    Recommendations for Other Services       Precautions / Restrictions Precautions Precautions: Fall;Other (comment) Precaution Comments: vent, trach    Mobility  Bed Mobility Overal bed mobility: Needs Assistance Bed Mobility: Supine to Sit;Sit to Supine     Supine to sit: +2 for physical assistance;Max assist Sit to supine: +2 for physical assistance;Total assist   General bed mobility comments: Assist to bring legs off of bed, elevate trunk into sitting, and bring hips to EOB. Assist for all aspects to return to supine.    Transfers                    Ambulation/Gait                 Stairs             Wheelchair Mobility    Modified Rankin (Stroke Patients Only)       Balance Overall balance assessment: Needs assistance Sitting-balance support: Bilateral upper extremity supported;Feet unsupported Sitting balance-Leahy Scale: Poor Sitting balance - Comments: Pt sat EOB x 3 minutes with mod assist. Pt limited by fatigue and incr respiraory work.                                    Cognition Arousal/Alertness: Awake/alert Behavior During Therapy: WFL for tasks assessed/performed Overall Cognitive Status: Difficult to assess Area of Impairment: Attention;Problem solving;Following commands                    Current Attention Level: Selective   Following Commands: Follows one step commands with increased time     Problem Solving: Slow processing;Difficulty sequencing;Requires verbal cues;Requires tactile cues General Comments: Pt following commands with increased time, mouthing answers to questions appropriately      Exercises      General Comments General comments (skin integrity, edema, etc.): Pt with SpO2 100% at rest. 93-94% with sitting EOB. Vent popping off frequently and pt with incr anxiety.      Pertinent Vitals/Pain Pain Assessment: Faces Faces Pain Scale: No hurt    Home Living                      Prior Function            PT Goals (current goals can now be found in the care plan section) Acute Rehab PT Goals Patient Stated Goal: to get stronger Progress towards PT goals: Progressing toward goals    Frequency    Min 3X/week      PT Plan Current plan remains appropriate    Co-evaluation              AM-PAC PT "6 Clicks" Mobility   Outcome Measure  Help needed turning from your back  to your side while in a flat bed without using bedrails?: Total Help needed moving from lying on your back to sitting on the side of a flat bed without using bedrails?: Total Help needed moving to and from a bed to a chair (including a wheelchair)?: Total Help needed standing up from a chair using your arms (e.g., wheelchair or bedside chair)?: Total Help needed to walk in hospital room?: Total Help needed climbing 3-5 steps with a railing? : Total 6 Click Score: 6    End of Session Equipment Utilized During Treatment: Other (comment) (vent) Activity Tolerance: Patient limited by fatigue Patient left: in bed;with call bell/phone within reach;with bed alarm set;with family/visitor present Nurse Communication: Mobility status PT Visit Diagnosis: Other abnormalities of gait and mobility (R26.89);Muscle weakness (generalized) (M62.81)     Time:  0677-0340 PT Time Calculation (min) (ACUTE ONLY): 21 min  Charges:  $Therapeutic Activity: 8-22 mins                     [facility] PT Acute Rehabilitation Services Pager [phone] Office [phone]    Angelina Ok [facility] 07/08/2020, 4:57 PM

## 2020-07-09 ENCOUNTER — Inpatient Hospital Stay
Admission: AD | Admit: 2020-07-09 | Discharge: 2020-09-11 | Disposition: A | Discharge status: Home / Self Care | Payer: BLUE CROSS/BLUE SHIELD | Source: Other Acute Inpatient Hospital | Attending: Internal Medicine | Admitting: Internal Medicine

## 2020-07-09 ENCOUNTER — Inpatient Hospital Stay (HOSPITAL_COMMUNITY): Payer: BLUE CROSS/BLUE SHIELD

## 2020-07-09 DIAGNOSIS — U071 COVID-19: Secondary | ICD-10-CM | POA: Diagnosis present

## 2020-07-09 DIAGNOSIS — J939 Pneumothorax, unspecified: Secondary | ICD-10-CM | POA: Diagnosis present

## 2020-07-09 DIAGNOSIS — J1282 Pneumonia due to coronavirus disease 2019: Secondary | ICD-10-CM | POA: Diagnosis present

## 2020-07-09 DIAGNOSIS — Z9911 Dependence on respirator [ventilator] status: Secondary | ICD-10-CM

## 2020-07-09 DIAGNOSIS — T85598A Other mechanical complication of other gastrointestinal prosthetic devices, implants and grafts, initial encounter: Secondary | ICD-10-CM

## 2020-07-09 DIAGNOSIS — J189 Pneumonia, unspecified organism: Secondary | ICD-10-CM

## 2020-07-09 DIAGNOSIS — J9621 Acute and chronic respiratory failure with hypoxia: Secondary | ICD-10-CM | POA: Diagnosis present

## 2020-07-09 DIAGNOSIS — G9341 Metabolic encephalopathy: Secondary | ICD-10-CM | POA: Diagnosis present

## 2020-07-09 DIAGNOSIS — J984 Other disorders of lung: Secondary | ICD-10-CM

## 2020-07-09 DIAGNOSIS — J9690 Respiratory failure, unspecified, unspecified whether with hypoxia or hypercapnia: Secondary | ICD-10-CM

## 2020-07-09 DIAGNOSIS — Z4659 Encounter for fitting and adjustment of other gastrointestinal appliance and device: Secondary | ICD-10-CM

## 2020-07-09 HISTORY — DX: Pneumothorax, unspecified: J93.9

## 2020-07-09 HISTORY — DX: COVID-19: U07.1

## 2020-07-09 HISTORY — DX: Metabolic encephalopathy: G93.41

## 2020-07-09 HISTORY — DX: Acute and chronic respiratory failure with hypoxia: J96.21

## 2020-07-09 HISTORY — DX: Pneumonia due to coronavirus disease 2019: J12.82

## 2020-07-09 LAB — GLUCOSE, CAPILLARY
Glucose-Capillary: 127 mg/dL — ABNORMAL HIGH (ref 70–99)
Glucose-Capillary: 131 mg/dL — ABNORMAL HIGH (ref 70–99)
Glucose-Capillary: 135 mg/dL — ABNORMAL HIGH (ref 70–99)
Glucose-Capillary: 163 mg/dL — ABNORMAL HIGH (ref 70–99)
Glucose-Capillary: 192 mg/dL — ABNORMAL HIGH (ref 70–99)

## 2020-07-09 LAB — CBC WITH DIFFERENTIAL/PLATELET
Abs Immature Granulocytes: 0.92 10*3/uL — ABNORMAL HIGH (ref 0.00–0.07)
Basophils Absolute: 0.1 10*3/uL (ref 0.0–0.1)
Basophils Relative: 1 %
Eosinophils Absolute: 0.2 10*3/uL (ref 0.0–0.5)
Eosinophils Relative: 2 %
HCT: 24.8 % — ABNORMAL LOW (ref 36.0–46.0)
Hemoglobin: 7.3 g/dL — ABNORMAL LOW (ref 12.0–15.0)
Immature Granulocytes: 9 %
Lymphocytes Relative: 16 %
Lymphs Abs: 1.6 10*3/uL (ref 0.7–4.0)
MCH: 28.9 pg (ref 26.0–34.0)
MCHC: 29.4 g/dL — ABNORMAL LOW (ref 30.0–36.0)
MCV: 98 fL (ref 80.0–100.0)
Monocytes Absolute: 1.4 10*3/uL — ABNORMAL HIGH (ref 0.1–1.0)
Monocytes Relative: 14 %
Neutro Abs: 6.2 10*3/uL (ref 1.7–7.7)
Neutrophils Relative %: 58 %
Platelets: 176 10*3/uL (ref 150–400)
RBC: 2.53 MIL/uL — ABNORMAL LOW (ref 3.87–5.11)
RDW: 18.1 % — ABNORMAL HIGH (ref 11.5–15.5)
WBC: 10.4 10*3/uL (ref 4.0–10.5)
nRBC: 1 % — ABNORMAL HIGH (ref 0.0–0.2)

## 2020-07-09 LAB — COMPREHENSIVE METABOLIC PANEL
ALT: 58 U/L — ABNORMAL HIGH (ref 0–44)
AST: 48 U/L — ABNORMAL HIGH (ref 15–41)
Albumin: 2.1 g/dL — ABNORMAL LOW (ref 3.5–5.0)
Alkaline Phosphatase: 75 U/L (ref 38–126)
Anion gap: 6 (ref 5–15)
BUN: 55 mg/dL — ABNORMAL HIGH (ref 8–23)
CO2: 37 mmol/L — ABNORMAL HIGH (ref 22–32)
Calcium: 8.4 mg/dL — ABNORMAL LOW (ref 8.9–10.3)
Chloride: 102 mmol/L (ref 98–111)
Creatinine, Ser: 0.66 mg/dL (ref 0.44–1.00)
GFR, Estimated: >60 mL/min (ref >60)
Glucose, Bld: 164 mg/dL — ABNORMAL HIGH (ref 70–99)
Potassium: 3.8 mmol/L (ref 3.5–5.1)
Sodium: 145 mmol/L (ref 135–145)
Total Bilirubin: 0.5 mg/dL (ref 0.3–1.2)
Total Protein: 6.3 g/dL — ABNORMAL LOW (ref 6.5–8.1)

## 2020-07-09 LAB — HEPARIN LEVEL (UNFRACTIONATED): Heparin Unfractionated: 0.21 IU/mL — ABNORMAL LOW (ref 0.30–0.70)

## 2020-07-09 MED ORDER — OXYCODONE HCL 5 MG/5ML PO SOLN: 5.0000 mg | Freq: Four times a day (QID) | ORAL | 0 refills | Status: AC | PRN

## 2020-07-09 MED ORDER — LIDOCAINE 5 % EX PTCH: 1.0000 | MEDICATED_PATCH | CUTANEOUS | 0 refills | Status: AC

## 2020-07-09 MED ORDER — IPRATROPIUM-ALBUTEROL 0.5-2.5 (3) MG/3ML IN SOLN: 3.0000 mL | RESPIRATORY_TRACT | Status: AC | PRN

## 2020-07-09 MED ORDER — ACETAMINOPHEN 160 MG/5ML PO SOLN: 650.0000 mg | Freq: Four times a day (QID) | ORAL | 0 refills | Status: AC | PRN

## 2020-07-09 MED ORDER — INSULIN ASPART 100 UNIT/ML ~~LOC~~ SOLN: 0.0000 [IU] | SUBCUTANEOUS | 11 refills | Status: AC

## 2020-07-09 MED ORDER — SODIUM CHLORIDE 0.9% FLUSH: 10.0000 mL | INTRAVENOUS | Status: AC | PRN

## 2020-07-09 MED ORDER — FREE WATER: 200.0000 mL | Status: AC

## 2020-07-09 MED ORDER — SIMVASTATIN 40 MG PO TABS: 40.0000 mg | ORAL_TABLET | Freq: Every day | ORAL | Status: AC

## 2020-07-09 MED ORDER — ONDANSETRON HCL 4 MG/2ML IJ SOLN
4.0000 mg | Freq: Four times a day (QID) | INTRAMUSCULAR | 0 refills | Status: AC | PRN
Start: 2020-07-09 — End: ?

## 2020-07-09 MED ORDER — NUTRISOURCE FIBER PO PACK: 1.0000 | PACK | Freq: Two times a day (BID) | ORAL | Status: AC

## 2020-07-09 MED ORDER — SODIUM CHLORIDE 0.9% FLUSH: 10.0000 mL | Freq: Two times a day (BID) | INTRAVENOUS | Status: AC

## 2020-07-09 MED ORDER — ENOXAPARIN SODIUM 40 MG/0.4ML ~~LOC~~ SOLN: 40.0000 mg | SUBCUTANEOUS | Status: AC

## 2020-07-09 MED ORDER — MELATONIN 3 MG PO TABS: 3.0000 mg | ORAL_TABLET | Freq: Every evening | ORAL | 0 refills | Status: AC | PRN

## 2020-07-09 MED ORDER — OXYCODONE HCL 5 MG PO TABS: 5.0000 mg | ORAL_TABLET | Freq: Two times a day (BID) | ORAL | 0 refills | Status: AC

## 2020-07-09 MED ORDER — SODIUM CHLORIDE 0.9 % IV SOLN: 250.0000 mL | INTRAVENOUS | 0 refills | Status: AC

## 2020-07-09 MED ORDER — BETHANECHOL CHLORIDE 10 MG PO TABS: 10.0000 mg | ORAL_TABLET | Freq: Three times a day (TID) | ORAL | Status: AC

## 2020-07-09 MED ORDER — CHLORHEXIDINE GLUCONATE 0.12% ORAL RINSE (MEDLINE KIT): 15.0000 mL | Freq: Two times a day (BID) | OROMUCOSAL | 0 refills | Status: AC

## 2020-07-09 MED ORDER — CLONIDINE HCL 0.1 MG PO TABS: 0.1000 mg | ORAL_TABLET | Freq: Three times a day (TID) | ORAL | 11 refills | Status: AC

## 2020-07-09 MED ORDER — FENTANYL 25 MCG/HR TD PT72: 1.0000 | MEDICATED_PATCH | TRANSDERMAL | 0 refills | Status: AC

## 2020-07-09 MED ORDER — LIP MEDEX EX OINT: TOPICAL_OINTMENT | CUTANEOUS | 0 refills | Status: AC | PRN

## 2020-07-09 MED ORDER — CHLORHEXIDINE GLUCONATE CLOTH 2 % EX PADS: 6.0000 | MEDICATED_PAD | Freq: Every day | CUTANEOUS | Status: AC

## 2020-07-09 MED ORDER — PROSOURCE TF PO LIQD: 45.0000 mL | Freq: Two times a day (BID) | ORAL | Status: AC

## 2020-07-09 MED ORDER — PANTOPRAZOLE SODIUM 40 MG PO PACK: 40.0000 mg | PACK | ORAL | Status: AC

## 2020-07-09 MED ORDER — VITAL 1.5 CAL PO LIQD: 1000.0000 mL | ORAL | Status: AC

## 2020-07-09 NOTE — Progress Notes (Addendum)
NAMEEmnet [name], MRN:  [id number], DOB:  [birth year], LOS: 39 ADMISSION DATE:  05/31/2020, CONSULTATION DATE:  3/5 REFERRING MD:  Arrien, CHIEF COMPLAINT:  Dyspnea   History of Present Illness:  65 y/o female presented with dyspnea on 2/18 to [facility], had severe hypoxemia, diagnosed with COVID 19 pneumonia.  Unvaccinated.  Treated with tocilizumab, steroids, remdesivir and antibiotics.  Intubated, moved to [facility].  Pneumococcal urine antigen positive.  Tracheostomy performed on 3/22.  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 3/03 increasing O2 requirements, started on Bipap 3/05 intubated, transferred to [facility] ETT 3/05 >> 3/22 Vancomycin 3/05 >> 3/07 Zosyn 3/05>> off  Cefepime 3/05 >> 3/07 3/06 Lt pneumothorax >> pig tail catheter placed Rocephin 3/07 >> 3/12, 3/16>>> 3/19 Lt IJ CVL 3/06 >> 3/26 Lt pig tail catheter 3/06 >> 3/27 L arm PICC 3/26 >> 3/07 hypoxia/hypotension/vent dys-synchrony >> improved after chest tube flushed 3/8 d/c steroids, start HCO3 gtt 3/10 d/c HCO3 gtt, off pressors 3/11-3/13 Diuresis CT chest 3/16 >> diffuse bilateral groundglass and interstitial opacities with multiple thick-walled septated cystic areas consistent with pneumatoceles.  No clear air-fluid levels.  Left side pigtail in place without pneumothorax 3/19 persistent fever, change ABx, transfuse 1 unit PRBC Linezolid 3/19 >>3/25 Fortaz 3/19 >> 3/25 3/22 tracheostomy >> 3/28 speaking valve used 3/28 CT chest >>>unchanged bilateral opacities, interval development of cysts including largest pneumatocele measuring 18 x14 cm  Interim History / Subjective:   Awake and on mechanical vent  Objective   Blood pressure 124/78, pulse (!) 109, temperature 98.6 F (37 C), temperature source Oral, resp. rate (!) 25, height 5\' 4"  (1.626 m), weight 81 kg, SpO2 96 %.    Vent Mode: PRVC FiO2 (%):  [50 %] 50 % Set Rate:  [22 bmp] 22 bmp Vt Set:  [430 mL] 430 mL PEEP:  [8 cmH20] 8 cmH20 Plateau Pressure:  [27 cmH20-34 cmH20] 27 cmH20   Intake/Output Summary (Last 24 hours) at 07/09/2020 1206 Last data filed at 07/09/2020 1114 Gross per 24 hour  Intake 370 ml  Output 1150 ml  Net -780 ml   Filed Weights   07/03/20 0500 07/05/20 0500 07/07/20 0500  Weight: 84.2 kg 83.2 kg 81 kg   Physical Exam: General: Chronically ill-appearing, no acute distress HENT: [place], AT, OP clear, MMM Neck: Trach in place, c/d/i Eyes: EOMI, no scleral icterus Respiratory: Vent supported breaths bilaterally.  No crackles, wheezing or rales Cardiovascular: RRR, -M/R/G, no JVD GI: BS+, soft, nontender Extremities:-Edema,-tenderness Neuro: Awake, alert, follows commands, moves extremities x 4  Labs/imaging that I havepersonally reviewed  (right click and "Reselect all SmartList Selections" daily)  COVID 2/18 >> positive MRSA PCR 3/05 >> negative Blood 3/05 >> negative Pneumococcal urine Ag 3/05 >> positive Sputum 3/17 >> Rare Candida albicans Sputum 3/19 >> Rare Candida albicans  Resolved Hospital Problem list   Severe sepsis secondary to COVID 19 pneumonia - resolved  Assessment & Plan:  Acute respiratory failure with hypoxemia due to ARDS from COVID 19 pneumonia with subsequent massive cystic changes in right lung Large pneumatocele  Full mechanical vent support. Trach collar as tolerated VAP prevention Daily WUA/SBT Continue speaking valve Will need LTACH  Supportive care for pneumatocele  Left pneumothorax, resolved, chest tube out Monitor CXR prn  Acute metabolic encephalopathy, uremic encephalopathy > improved Continue PRN oxycodone Continue  clonidine Continue fentanyl patch Frequent orientation  Anemia of critical illness Monitor for bleeding Transfuse PRBC for Hgb < 7 gm/dL  Acute urinary retention DC foley today Bladder scan Urecholine  Hyperglycemia SSI   Best practice (right click and "Reselect all SmartList Selections" daily)  Diet:  Tube Feed  Pain/Anxiety/Delirium protocol (if indicated): No VAP protocol (if indicated): Yes DVT prophylaxis: LMWH GI prophylaxis: PPI Glucose control:  SSI Yes Central venous access:  Yes, and it is still needed Arterial line:  N/A Foley:  Yes, and it is no longer needed Mobility:  OOB  PT consulted: Yes Last date of multidisciplinary goals of care discussion [3/29 Byrum, husband] Code Status:  full code Disposition: remain in ICU, OK for LTAC  Labs   CBC: Recent Labs  Lab 07/04/20 0345 07/05/20 0527 07/06/20 0351 07/07/20 0303 07/09/20 0400  WBC 7.1 7.2 8.5 8.9 10.4  NEUTROABS  --   --   --   --  6.2  HGB 7.4* 7.4* 7.6* 7.5* 7.3*  HCT 23.3* 24.2* 25.3* 24.9* 24.8*  MCV 93.6 94.5 95.1 95.4 98.0  PLT 168 175 175 175 176    Basic Metabolic Panel: Recent Labs  Lab 07/04/20 0345 07/05/20 0527 07/06/20 0351 07/07/20 0303 07/08/20 0809 07/09/20 0400  NA 142 142 142 143 147* 145  K 3.0* 4.1 3.7 3.3* 4.0 3.8  CL 102 101 100 98 104 102  CO2 31 35* 36* 37* 38* 37*  GLUCOSE 180* 157* 154* 179* 125* 164*  BUN 34* 35* 39* 49* 55* 55*  CREATININE 0.84 0.73 0.68 0.72 0.75 0.66  CALCIUM 7.6* 8.4* 8.7* 8.7* 8.7* 8.4*  MG 1.8 2.1 2.1 2.1  --   --   PHOS  --   --   --  4.5  --   --    GFR: Estimated Creatinine Clearance: 74.1 mL/min (by C-G formula based on SCr of 0.66 mg/dL). Recent Labs  Lab 07/05/20 0527 07/06/20 0351 07/07/20 0303 07/09/20 0400  WBC 7.2 8.5 8.9 10.4    Liver Function Tests: Recent Labs  Lab 07/09/20 0400  AST 48*  ALT 58*  ALKPHOS 75  BILITOT 0.5  PROT 6.3*  ALBUMIN 2.1*   No results for input(s): LIPASE, AMYLASE in the  last 168 hours. No results for input(s): AMMONIA in the last 168 hours.  ABG    Component Value Date/Time   PHART 7.404 06/26/2020 0908   PCO2ART 64.7 (H) 06/26/2020 0908   PO2ART 57 (L) 06/26/2020 0908   HCO3 40.1 (H) 06/26/2020 0908   TCO2 42 (H) 06/26/2020 0908   ACIDBASEDEF 2.9 (H) 06/19/2020 0332   O2SAT 86.0 06/26/2020 0908     Coagulation Profile: No results for input(s): INR, PROTIME in the last 168 hours.  Cardiac Enzymes: No results for input(s): CKTOTAL, CKMB, CKMBINDEX, TROPONINI in the last 168 hours.  HbA1C: Hgb A1c MFr Bld  Date/Time Value Ref Range Status  06/16/2020 09:42 AM 6.4 (H) 4.8 - 5.6 % Final    Comment:    (NOTE) Pre diabetes:          5.7%-6.4%  Diabetes:              >6.4%  Glycemic control for   <7.0% adults with diabetes     CBG: Recent Labs  Lab 07/08/20 1937 07/09/20 0000 07/09/20 0326 07/09/20  0753 07/09/20 1116  GLUCAP 130* 192* 135* 131* 163*     Critical care time: 45 minutes     The patient is critically ill and requires high complexity decision making for assessment and support, frequent evaluation and titration of therapies, application of advanced monitoring technologies and extensive interpretation of multiple databases.    Mechele Collin, M.D. [facility] Pulmonary/Critical Care Medicine 07/09/2020 12:06 PM   Please see Amion for pager number to reach on-call Pulmonary and Critical Care Team.

## 2020-07-09 NOTE — Progress Notes (Signed)
Nutrition Follow-up  DOCUMENTATION CODES:   Not applicable  INTERVENTION:   Continue tube feeding via Cortrak: - Vital 1.5 @ 60 ml/hr (1440 ml/day) - ProSource TF 45 ml BID  Tube feeding regimen provides2240kcal, 119grams of protein, and 1061m of H2O.  - Continue Nutrisource Fiber BID per tube  NUTRITION DIAGNOSIS:   Increased nutrient needs related to acute illness (COVID-19 pneumonia) as evidenced by estimated needs.  Ongoing, being addressed via TF  GOAL:   Patient will meet greater than or equal to 90% of their needs  Met via TF  MONITOR:   Vent status,Labs,Weight trends,TF tolerance,Skin,I & O's  REASON FOR ASSESSMENT:   LOS    ASSESSMENT:   Patient transferred to ICU for heated high flow with BiPAP. She  presented with acute hypoxic respiratory failure due to COVID-19 pneumonia. Complaint of decreased appetite and general weakness prior to admission.  2/18- [facility] pneum[facility] with pigtail cath placed 3/22 - tracheostomy 3/23 - Cortrak placed, tip gastric per Cortrak team  Discussed pt with RN and during ICU rounds. Pt tolerating tube feeding. Will continue with current regimen.  Unable to speak with pt at this time. Provider in room speaking with pt and husband. Noted plan for LTACH.  Admit weight: 72.6 kg Current weight: 81 kg Lowest weight: 69.7 kg  Pt with +2 pitting edema to BUE and perineal region and +1 pitting edema to BLE.  Current TF: Vital 1.5 @ 60, ProSource TF 45 ml BID, free water flushes 200 ml q 4 hours  Patient currently on vent support via trach MV: 9.2 L/min Temp (24hrs), Avg:98.9 F (37.2 C), Min:98.6 F (37 C), Max:99.5 F (37.5 C)  Medications reviewed and include: nutrisource fiber BID, SSI q 4 hours, protonix  Labs reviewed: BUN 55, elevated LFTs, hemoglobin 7.3 CBG's: 130-192 x 24 hours  UOP: 1700 ml x 24 hours  Diet Order:   Diet Order             Diet NPO time specified  Diet effective midnight                 EDUCATION NEEDS:   Not appropriate for education at this time  Skin:  Skin Assessment: Skin Integrity Issues: Other: skin tear to buttocks x 2  Last BM:  07/08/20  Height:   Ht Readings from Last 1 Encounters:  07/03/20 _0  (1.626 m)    Weight:   Wt Readings from Last 1 Encounters:  07/07/20 81 kg    BMI:  Body mass index is 30.65 kg/m.  Estimated Nutritional Needs:   Kcal:  2100-2400 kcals  Protein:  105-120 grams  Fluid:  >2000 ml daily    KGustavus Bryant MS, RD, LDN Inpatient Clinical Dietitian Please see AMiON for contact information.

## 2020-07-09 NOTE — Progress Notes (Signed)
Physical Therapy Treatment Patient Details Name: Rachel [name] MRN: [id number] DOB: 30-Sep-1955 Today's Date: 07/09/2020    History of Present Illness Pt adm 2/18 to [facility] with acute hypoxic respiratory failure due to Covid PNA. Intubated 3/5 and transferred to [facility]. Pt with lt pneumothorax 3/6 - chest tube placed. Chest tube removed 3/25. Trach placed 3/22. PMH - kidney stones, smoker, obesity.    PT Comments    Pt admitted with above diagnosis. Pt was able to tolerate tilting for 8 min at 58 degrees. Performed mini squats as well. Fatigues quickly but does push herself.  Pt positioned in 50 degree chair position on departure and husband educated in exercises to continue with pt. Pt progressing well overall.  Pt currently with functional limitations due to balance and endurance deficits. Pt will benefit from skilled PT to increase their independence and safety with mobility to allow discharge to the venue listed below.     Follow Up Recommendations  LTACH     Equipment Recommendations  Other (comment) (to be determined)    Recommendations for Other Services       Precautions / Restrictions Precautions Precautions: Fall;Other (comment) Precaution Comments: vent, trach Restrictions Weight Bearing Restrictions: No    Mobility  Bed Mobility               General bed mobility comments: Moved pt up and down in bed with pad for positioning for tilting with total assist +2 Start Time: 1020 Angle: 58 degrees Total Minutes in Angle: 8 minutes Patient Response: Cooperative;Flat affect  Transfers Overall transfer level: Needs assistance Equipment used:  (Tilt bed) Transfers: Sit to/from Stand Sit to Stand: Total assist;+2 physical assistance         General transfer comment: Progressively inclined tilt bed to 58 degree position and pt tolerated for 8 minutes. Able to shift hips left and right.  Pt performed about 10 squats with the straps loosened as well.  Also performed quad  and glut sets.  Ambulation/Gait             General Gait Details: TBA   Stairs             Wheelchair Mobility    Modified Rankin (Stroke Patients Only)       Balance           Standing balance support: Bilateral upper extremity supported;During functional activity Standing balance-Leahy Scale: Zero Standing balance comment: relies on tilt bed and UE support                            Cognition Arousal/Alertness: Awake/alert Behavior During Therapy: WFL for tasks assessed/performed Overall Cognitive Status: Difficult to assess Area of Impairment: Attention;Problem solving;Following commands                   Current Attention Level: Selective   Following Commands: Follows one step commands with increased time     Problem Solving: Slow processing;Difficulty sequencing;Requires verbal cues;Requires tactile cues General Comments: Pt following commands with increased time, mouthing answers to questions appropriately      Exercises General Exercises - Upper Extremity Shoulder Flexion: AAROM;Both;10 reps;Supine Elbow Flexion: AAROM;Both;10 reps;Supine Elbow Extension: AAROM;Both;10 reps;Supine General Exercises - Lower Extremity Ankle Circles/Pumps: Both;Supine;AAROM;5 reps Quad Sets: AAROM;Both;5 reps;Seated Gluteal Sets: AAROM;Both;10 reps;Seated Long Arc Quad: AAROM;Both;5 reps;Seated (once in chair position at end of treatment) Mini-Sqauts: AAROM;Both;10 reps;Standing (strapped in tilt bed) Other Exercises Other Exercises: reviewed above exercises with husband  Other Exercises: pt in 50 degree chair position    General Comments General comments (skin integrity, edema, etc.): 50% FiO2, PEEP 8; BP 119/84 with MAP 94 iniitally; HR 115 bpm-120 bpm with activity; at 45 degrees, 98% O2 and BP 120/80 with MAP 92.  At 58 degrees, BP 120/81.  Pt tolerated tilt well with VSS.      Pertinent Vitals/Pain Pain Assessment: No/denies pain     Home Living                      Prior Function            PT Goals (current goals can now be found in the care plan section) Acute Rehab PT Goals Patient Stated Goal: to get stronger Progress towards PT goals: Progressing toward goals    Frequency    Min 3X/week      PT Plan Current plan remains appropriate    Co-evaluation              AM-PAC PT "6 Clicks" Mobility   Outcome Measure  Help needed turning from your back to your side while in a flat bed without using bedrails?: Total Help needed moving from lying on your back to sitting on the side of a flat bed without using bedrails?: Total Help needed moving to and from a bed to a chair (including a wheelchair)?: Total Help needed standing up from a chair using your arms (e.g., wheelchair or bedside chair)?: Total Help needed to walk in hospital room?: Total Help needed climbing 3-5 steps with a railing? : Total 6 Click Score: 6    End of Session Equipment Utilized During Treatment: Other (comment) (vent/trach) Activity Tolerance: Patient limited by fatigue Patient left: in bed;with call bell/phone within reach;with family/visitor present (in chair position) Nurse Communication: Mobility status PT Visit Diagnosis: Other abnormalities of gait and mobility (R26.89);Muscle weakness (generalized) (M62.81)     Time: 6967-8938 PT Time Calculation (min) (ACUTE ONLY): 40 min  Charges:  $Therapeutic Exercise: 8-22 mins $Therapeutic Activity: 23-37 mins                     Bertina Guthridge M,PT Acute Rehab Services [phone] (pager)   Bevelyn Buckles 07/09/2020, 11:32 AM

## 2020-07-09 NOTE — Progress Notes (Signed)
Pt [facility] to [facility], report given to [facility] RN. Pt's belongings given to pt's husband. Pt husband and granddaughter at bedside.

## 2020-07-09 NOTE — TOC Transition Note (Signed)
Transition of Care [facility]) - CM/SW Discharge Note   Patient Details  Name: Rachel [name] MRN: [id number] Date of Birth: 02-Aug-1955  [facility] of [facility]) CM/SW Contact:  Bess Kinds, RN Phone Number: [phone] 07/09/2020, 4:25 PM   Clinical Narrative:     Spoke with patient and spouse at bedside this AM to discuss pending authorization for [facility]. Received notification from [place] at [employer] that insurance authorization was received, and bed available today if patient ready. MD notified - DC summary/order written. Nursing to transport [facility] to [facility]. Report to be called to [phone]. Patient to go to room #6. Accepting MD, Carron Curie. No further TOC needs identified at this time.    Final next level of care: Long Term Acute Care (LTAC) Barriers to Discharge: No Barriers Identified   Patient Goals and CMS Choice Patient states their goals for this hospitalization and ongoing recovery are:: ultimately get back home with husband CMS Medicare.gov Compare Post Acute Care list provided to:: Patient Choice offered to / list presented to : [facility]  Discharge Placement                       Discharge Plan and Services In-house Referral: Clinical Social Work Discharge Planning Services: CM Consult Post Acute Care Choice: Long Term Acute Care (LTAC)          DME Arranged: N/A DME Agency: NA Date DME Agency Contacted: 06/11/20 Time DME Agency Contacted: [phone] Representative spoke with at DME Agency: [facility]            Social Determinants of Health (SDOH) Interventions     Readmission Risk Interventions No flowsheet data found.

## 2020-07-09 NOTE — Discharge Summary (Signed)
Physician Discharge Summary  Patient ID: Rachel [name] MRN: [id number] DOB/AGE: Oct 16, 1955 65 y.o.  Admit date: 05/31/2020 Discharge date: 07/09/2020    Discharge Diagnoses:  Principal Problem:   Pneumonia due to COVID-19 virus Active Problems:   Acute respiratory failure with hypoxia (HCC)   GERD (gastroesophageal reflux disease)   Nephrolithiasis   Hypokalemia   Hyponatremia   Hypoxia   COVID-19   Positive D dimer   Acute respiratory distress   Goals of care, counseling/discussion   DNR (do not resuscitate)   Respiratory failure ([place])                                                       D/c plan by Discharge Diagnosis Acute respiratory failure with hypoxemia due to ARDS from COVID 19 pneumonia with subsequent massive cystic changes in right lung Large pneumatocele  PLAN -  Full mechanical vent support. Trach collar as tolerated VAP prevention Daily WUA/SBT Continue speaking valve Will need LTACH Supportive care for pneumatocele - f/u imaging in 1 week   Left pneumothorax, resolved, chest tube out Monitor CXR prn  Acute metabolic encephalopathy, uremic encephalopathy > improved Continue PRN oxycodone Continue clonidine Continue fentanyl patch Frequent orientation  Anemia of critical illness Monitor for bleeding Transfuse PRBC for Hgb < 7 gm/dL  Acute urinary retention DC foley today Bladder scan Urecholine  Hyperglycemia SSI   Brief Summary: Rachel [name] is a 65 y.o. y/o female with hx GERD, hyperlipidemia, nephrolithiasis who was initially admitted 2/18 to [facility] with 7 day hx dyspnea, URI, cough. She tested POS for covid, unvaccinated.  She has been treated with IV fluids, systemic steroids, antibiotics and antivirals. She continued to decline with worsening hypoxemia and respiratory failure and required intubation 06/15/20 and was tx to [facility].  She has had prolonged course with persistent vent dependent respiratory failure. Course  was c/b L pneumothorax, improved with L pigtail chest tube now removed. She failed attempts at vent weaning and ultimately required tracheostomy 3/22. She has continued to have minimal success at vent weaning and would benefit from max weaning and therapy efforts at [facility].   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 3/03 increasing O2 requirements, started on Bipap 3/05 intubated, transferred to [facility] ETT 3/05 >> 3/22 Vancomycin 3/05 >> 3/07 Zosyn 3/05>>off  Cefepime 3/05 >> 3/07 3/06 Lt pneumothorax >> pig tail catheter placed Rocephin 3/07 >> 3/12, 3/16>>> 3/19 Lt IJ CVL 3/06 >> 3/26 Lt pig tail catheter 3/06 >> 3/27 L arm PICC 3/26 >> 3/07 hypoxia/hypotension/vent dys-synchrony >> improved after chest tube flushed 3/8 d/c steroids, start HCO3 gtt 3/10 d/c HCO3 gtt, off pressors 3/11-3/13 Diuresis CT chest 3/16 >>diffuse bilateral groundglass and interstitial opacities with multiple thick-walled septated cystic areas consistent with pneumatoceles. No clear air-fluid levels. Left side pigtail in place without pneumothorax 3/19 persistent fever, change ABx, transfuse 1 unit PRBC Linezolid 3/19 >>3/25 Fortaz 3/19 >> 3/25 3/22 tracheostomy >> 3/28 speaking valve used 3/28 CT chest >>>unchanged bilateral opacities, interval development of cysts including largest pneumatocele measuring 18 x14 cm    Vitals:   07/09/20 1300 07/09/20 1400 07/09/20 1500 07/09/20 1549  BP: 104/70  109/69 136/87 136/87  Pulse: (!) 102 99 (!) 104 (!) 103  Resp: (!) 24 (!) 23  (!) 24  Temp:      TempSrc:      SpO2: 97% 99% 97% 98%  Weight:      Height:         Discharge Labs  BMET Recent Labs  Lab 07/04/20 0345 07/05/20 0527 07/06/20 0351 07/07/20 0303 07/08/20 0809 07/09/20 0400  NA 142 142 142 143 147* 145  K 3.0* 4.1 3.7  3.3* 4.0 3.8  CL 102 101 100 98 104 102  CO2 31 35* 36* 37* 38* 37*  GLUCOSE 180* 157* 154* 179* 125* 164*  BUN 34* 35* 39* 49* 55* 55*  CREATININE 0.84 0.73 0.68 0.72 0.75 0.66  CALCIUM 7.6* 8.4* 8.7* 8.7* 8.7* 8.4*  MG 1.8 2.1 2.1 2.1  --   --   PHOS  --   --   --  4.5  --   --      CBC  Recent Labs  Lab 07/06/20 0351 07/07/20 0303 07/09/20 0400  HGB 7.6* 7.5* 7.3*  HCT 25.3* 24.9* 24.8*  WBC 8.5 8.9 10.4  PLT 175 175 176   Anti-Coagulation No results for input(s): INR in the last 168 hours.        Scheduled Meds: . bethanechol  10 mg Per Tube TID  . chlorhexidine gluconate (MEDLINE KIT)  15 mL Mouth Rinse BID  . Chlorhexidine Gluconate Cloth  6 each Topical Daily  . cloNIDine  0.1 mg Per Tube TID  . enoxaparin (LOVENOX) injection  40 mg Subcutaneous Q24H  . feeding supplement (PROSource TF)  45 mL Per Tube BID  . fentaNYL  1 patch Transdermal Q72H  . fiber  1 packet Per Tube BID  . free water  200 mL Per Tube Q4H  . insulin aspart  0-20 Units Subcutaneous Q4H  . lidocaine  1 patch Transdermal Q24H  . mouth rinse  15 mL Mouth Rinse 10 times per day  . oxyCODONE  5 mg Per Tube Q12H  . pantoprazole sodium  40 mg Per Tube Q24H  . simvastatin  40 mg Per Tube q1800  . sodium chloride flush  10-40 mL Intracatheter Q12H   Continuous Infusions: . sodium chloride Stopped (07/03/20 1741)  . feeding supplement (VITAL 1.5 CAL) 1,000 mL (07/09/20 0846)   PRN Meds:.acetaminophen (TYLENOL) oral liquid 160 mg/5 mL **OR** acetaminophen **OR** acetaminophen, ipratropium-albuterol, lip balm, melatonin, [DISCONTINUED] ondansetron **OR** ondansetron (ZOFRAN) IV, oxyCODONE, sodium chloride flush, sodium chloride flush      Disposition: LTAC  Discharged Condition: Rachel [name] has met maximum benefit of inpatient care and is medically stable and cleared for discharge.  Patient is pending follow up as above.      Time spent on disposition:  Greater than 35 minutes.    SignedNickolas Madrid, NP 07/09/2020  3:52 PM Pager: [phone] or [phone]

## 2020-07-10 ENCOUNTER — Other Ambulatory Visit (HOSPITAL_COMMUNITY): Payer: Self-pay

## 2020-07-10 DIAGNOSIS — J1282 Pneumonia due to coronavirus disease 2019: Secondary | ICD-10-CM

## 2020-07-10 DIAGNOSIS — J9621 Acute and chronic respiratory failure with hypoxia: Secondary | ICD-10-CM

## 2020-07-10 DIAGNOSIS — J939 Pneumothorax, unspecified: Secondary | ICD-10-CM

## 2020-07-10 DIAGNOSIS — G9341 Metabolic encephalopathy: Secondary | ICD-10-CM

## 2020-07-10 DIAGNOSIS — U071 COVID-19: Secondary | ICD-10-CM

## 2020-07-10 DIAGNOSIS — Z9911 Dependence on respirator [ventilator] status: Secondary | ICD-10-CM

## 2020-07-10 LAB — BLOOD GAS, ARTERIAL
Acid-Base Excess: 10.8 mmol/L — ABNORMAL HIGH (ref 0.0–2.0)
Bicarbonate: 34.8 mmol/L — ABNORMAL HIGH (ref 20.0–28.0)
FIO2: 45
O2 Saturation: 96 %
Patient temperature: 36.4
pCO2 arterial: 44 mmHg (ref 32.0–48.0)
pH, Arterial: 7.506 — ABNORMAL HIGH (ref 7.350–7.450)
pO2, Arterial: 73.2 mmHg — ABNORMAL LOW (ref 83.0–108.0)

## 2020-07-10 LAB — CBC
HCT: 27.6 % — ABNORMAL LOW (ref 36.0–46.0)
Hemoglobin: 7.9 g/dL — ABNORMAL LOW (ref 12.0–15.0)
MCH: 28.9 pg (ref 26.0–34.0)
MCHC: 28.6 g/dL — ABNORMAL LOW (ref 30.0–36.0)
MCV: 101.1 fL — ABNORMAL HIGH (ref 80.0–100.0)
Platelets: 192 10*3/uL (ref 150–400)
RBC: 2.73 MIL/uL — ABNORMAL LOW (ref 3.87–5.11)
RDW: 18.8 % — ABNORMAL HIGH (ref 11.5–15.5)
WBC: 14.3 10*3/uL — ABNORMAL HIGH (ref 4.0–10.5)
nRBC: 0.4 % — ABNORMAL HIGH (ref 0.0–0.2)

## 2020-07-10 LAB — URINALYSIS, ROUTINE W REFLEX MICROSCOPIC
Bacteria, UA: NONE SEEN
Bilirubin Urine: NEGATIVE
Glucose, UA: NEGATIVE mg/dL
Ketones, ur: NEGATIVE mg/dL
Leukocytes,Ua: NEGATIVE
Nitrite: NEGATIVE
Protein, ur: 30 mg/dL — AB
Specific Gravity, Urine: 1.017 (ref 1.005–1.030)
pH: 6 (ref 5.0–8.0)

## 2020-07-10 LAB — COMPREHENSIVE METABOLIC PANEL
ALT: 54 U/L — ABNORMAL HIGH (ref 0–44)
AST: 54 U/L — ABNORMAL HIGH (ref 15–41)
Albumin: 2.3 g/dL — ABNORMAL LOW (ref 3.5–5.0)
Alkaline Phosphatase: 71 U/L (ref 38–126)
Anion gap: 11 (ref 5–15)
BUN: 51 mg/dL — ABNORMAL HIGH (ref 8–23)
CO2: 30 mmol/L (ref 22–32)
Calcium: 8.5 mg/dL — ABNORMAL LOW (ref 8.9–10.3)
Chloride: 106 mmol/L (ref 98–111)
Creatinine, Ser: 0.62 mg/dL (ref 0.44–1.00)
GFR, Estimated: >60 mL/min (ref >60)
Glucose, Bld: 157 mg/dL — ABNORMAL HIGH (ref 70–99)
Potassium: 3.8 mmol/L (ref 3.5–5.1)
Sodium: 147 mmol/L — ABNORMAL HIGH (ref 135–145)
Total Bilirubin: 0.6 mg/dL (ref 0.3–1.2)
Total Protein: 6.7 g/dL (ref 6.5–8.1)

## 2020-07-10 LAB — HEMOGLOBIN A1C
Hgb A1c MFr Bld: 6.3 % — ABNORMAL HIGH (ref 4.8–5.6)
Mean Plasma Glucose: 134.11 mg/dL

## 2020-07-10 LAB — PHOSPHORUS: Phosphorus: 3.9 mg/dL (ref 2.5–4.6)

## 2020-07-10 LAB — MAGNESIUM: Magnesium: 2.3 mg/dL (ref 1.7–2.4)

## 2020-07-10 LAB — TSH: TSH: 2.491 u[IU]/mL (ref 0.350–4.500)

## 2020-07-10 NOTE — Consult Note (Signed)
Pulmonary Critical Care Medicine [facility]  PULMONARY SERVICE  Date of Service: 07/10/2020  PULMONARY CRITICAL CARE Rachel [name]  ZOX:[id number]  DOB: 1956/04/08   DOA: 07/09/2020  Referring Physician: Carron Curie, MD  HPI: Rachel [name] is a 65 y.o. female seen for follow up of Acute on Chronic Respiratory Failure.  Patient has multiple medical problems including renal stones kidney disease GERD presented to the hospital because of increasing shortness of breath.  Apparently was found to have COVID-19 virus infection and COVID-19 pneumonia.  Patient was admitted to the hospital had to be admitted into the ICU with respiratory decompensation eventually intubated on mechanical ventilation.  Patient was not able to come off of the ventilator.  While in the hospital complications did include development of pneumothorax which required drainage and now apparently has been doing fine.  Patient also had the hypercoagulable state positive D-dimer.  She is transferred now to our facility for further management.  Review of Systems:  ROS performed and is unremarkable other than noted above.  Past Medical History:  Diagnosis Date  . GERD (gastroesophageal reflux disease)   . Hypercholesteremia   . Kidney stones   . Wears dentures    full upper, partial lower    Past Surgical History:  Procedure Laterality Date  . ABDOMINAL HYSTERECTOMY    . BREAST BIOPSY Right   . BREAST EXCISIONAL BIOPSY Right   . COLONOSCOPY    . COLONOSCOPY WITH PROPOFOL N/A 04/22/2015   Procedure: COLONOSCOPY WITH PROPOFOL;  Surgeon: Midge Minium, MD;  Location: [facility];  Service: Endoscopy;  Laterality: N/A;  . KIDNEY STONE SURGERY      Social History:    reports that she has quit smoking. Her smoking use included cigarettes. She has never used smokeless tobacco. She reports that she does not drink alcohol and does not use drugs.  Family History: Non-Contributory to the  present illness  No Known Allergies  Medications: Reviewed on Rounds  Physical Exam:  Vitals: Temperature is 97.0 pulse 107 respiratory 24 blood pressure is 130/78 saturations 91%  Ventilator Settings on assist control FiO2 50% PEEP 5 tidal volume 530  . General: Comfortable at this time . Eyes: Grossly normal lids, irises & conjunctiva . ENT: grossly tongue is normal . Neck: no obvious mass . Cardiovascular: S1-S2 normal no gallop or rub . Respiratory: No rhonchi no rales noted at this time . Abdomen: Soft and nontender . Skin: no rash seen on limited exam . Musculoskeletal: not rigid . Psychiatric:unable to assess . Neurologic: no seizure no involuntary movements         Labs on Admission:  Basic Metabolic Panel: Recent Labs  Lab 07/04/20 0345 07/05/20 0527 07/06/20 0351 07/07/20 0303 07/08/20 0809 07/09/20 0400 07/10/20 0952  NA 142 142 142 143 147* 145 147*  K 3.0* 4.1 3.7 3.3* 4.0 3.8 3.8  CL 102 101 100 98 104 102 106  CO2 31 35* 36* 37* 38* 37* 30  GLUCOSE 180* 157* 154* 179* 125* 164* 157*  BUN 34* 35* 39* 49* 55* 55* 51*  CREATININE 0.84 0.73 0.68 0.72 0.75 0.66 0.62  CALCIUM 7.6* 8.4* 8.7* 8.7* 8.7* 8.4* 8.5*  MG 1.8 2.1 2.1 2.1  --   --  2.3  PHOS  --   --   --  4.5  --   --  3.9    Recent Labs  Lab 07/10/20 0525  PHART 7.506*  PCO2ART 44.0  PO2ART 73.2*  HCO3 34.8*  O2SAT 96.0    Liver Function Tests: Recent Labs  Lab 07/09/20 0400 07/10/20 0952  AST 48* 54*  ALT 58* 54*  ALKPHOS 75 71  BILITOT 0.5 0.6  PROT 6.3* 6.7  ALBUMIN 2.1* 2.3*   No results for input(s): LIPASE, AMYLASE in the last 168 hours. No results for input(s): AMMONIA in the last 168 hours.  CBC: Recent Labs  Lab 07/05/20 0527 07/06/20 0351 07/07/20 0303 07/09/20 0400 07/10/20 0952  WBC 7.2 8.5 8.9 10.4 14.3*  NEUTROABS  --   --   --  6.2  --   HGB 7.4* 7.6* 7.5* 7.3* 7.9*  HCT 24.2* 25.3* 24.9* 24.8* 27.6*  MCV 94.5 95.1 95.4 98.0 101.1*  PLT 175 175  175 176 192    Cardiac Enzymes: No results for input(s): CKTOTAL, CKMB, CKMBINDEX, TROPONINI in the last 168 hours.  BNP (last 3 results) Recent Labs    05/31/20 1252  BNP 76.0    ProBNP (last 3 results) No results for input(s): PROBNP in the last 8760 hours.   Radiological Exams on Admission: CT CHEST WO CONTRAST  Result Date: 07/08/2020 CLINICAL DATA:  Shortness of breath.  Pneumonia due to COVID-19. EXAM: CT CHEST WITHOUT CONTRAST TECHNIQUE: Multidetector CT imaging of the chest was performed following the standard protocol without IV contrast. COMPARISON:  July 06, 2020.  May 31, 2020. FINDINGS: Cardiovascular: Atherosclerosis of thoracic aorta is noted without aneurysm formation. Normal cardiac size. No pericardial effusion. Mediastinum/Nodes: Tracheostomy tube is in grossly good position. Enteric tube is seen passing through esophagus and into stomach. Thyroid gland is unremarkable. No adenopathy is noted. Lungs/Pleura: Continued bilateral lung opacities are noted concerning for multifocal pneumonia. Small right pleural effusion is noted. There is interval development of multiple cystic areas within the lung parenchyma, but most prominently seen in the right lobe. The largest such pneumatocele measures 18 x 14 cm. It is causing mediastinal shift to the left. Multiloculated air collection is noted posteriorly in right lung which contains multiple fluid levels, consistent with complex pneumatocele. Upper Abdomen: No acute abnormality. Musculoskeletal: No chest wall mass or suspicious bone lesions identified. IMPRESSION: 1. Continued bilateral lung opacities are noted concerning for multifocal pneumonia. 2. There is interval development of multiple cystic areas within the lung parenchyma, but most prominently seen in the right lobe. The largest such pneumatocele measures 18 x 14 cm. It is causing significant mediastinal shift to the left. Multiloculated air collection is noted  posteriorly in right lung which contains multiple fluid levels, consistent with complex pneumatocele. 3. Small right pleural effusion is noted. 4. Tracheostomy tube is in grossly good position. 5. Aortic atherosclerosis. Aortic Atherosclerosis (ICD10-I70.0). Electronically Signed   By: Lupita Raider M.D.   On: 07/08/2020 20:03   DG CHEST PORT 1 VIEW  Result Date: 07/10/2020 CLINICAL DATA:  Respiratory failure EXAM: PORTABLE CHEST 1 VIEW COMPARISON:  July 09, 2020 chest radiograph and chest CT July 08, 2020 FINDINGS: Tracheostomy catheter tip is 4.3 cm above the carina. Enteric tube tip is below the diaphragm. Central catheter tip is at the cavoatrial junction. No pneumothorax. Multiple pneumatoceles are again noted occupying much of the right mid and lower lung regions. Apparent small right pleural effusion. There is persistent volume loss with ill-defined airspace opacity in the right upper lobe. There is patchy airspace opacity in portions of the left upper and left lower lung regions with small left pleural effusion. Heart size normal. Heart shifted toward the left, likely due  to the large pneumatoceles on the right. No bone lesions. IMPRESSION: Tube and catheter positions as described without pneumothorax. Extensive pneumatocele formation again noted on the right which deviates the heart toward the left. Patchy airspace opacity in the right upper lobe and in areas of the left upper and left lower lobe regions. Small pleural effusion on each side. Stable cardiac silhouette. Electronically Signed   By: Bretta Bang III M.D.   On: 07/10/2020 08:32   DG Chest Port 1 View  Result Date: 07/09/2020 CLINICAL DATA:  Acute respiratory failure EXAM: PORTABLE CHEST 1 VIEW COMPARISON:  07/06/2020 FINDINGS: Tracheostomy, nasoenteric feeding tube extending into the upper abdomen, are unchanged. Right upper extremity PICC line is in place with its tip within the superior vena cava. Left internal jugular central  venous catheter has been removed. Multiple large pneumatoceles essentially replacing the right lung base are again identified within the right lung base demonstrating mass effect with compressive atelectasis of the a right upper lobe and mediastinal shift to the left. Superimposed patchy pulmonary infiltrate is seen throughout the aerated right upper lobe and left lung. No pneumothorax. Small right pleural effusion unchanged. Cardiac size within normal limits. IMPRESSION: Right upper extremity PICC line tip within the superior vena cava. Multiple large pneumatocele is a essentially replacing the right lung base demonstrating similar mass effect upon the aerated right upper lobe and mediastinum. Stable superimposed patchy pulmonary infiltrate diffusely within the aerated right upper lobe and left lung. Stable small right pleural effusion. Electronically Signed   By: Helyn Numbers MD   On: 07/09/2020 04:26   DG Abd Portable 1V  Result Date: 07/10/2020 CLINICAL DATA:  Nasogastric tube placement EXAM: PORTABLE ABDOMEN - 1 VIEW COMPARISON:  None. FINDINGS: Nasoenteric feeding tube tip is seen in the expected distal body of the stomach. The visualized abdominal gas pattern is unremarkable. Small right pleural effusion noted. Large pneumatocele or loculated pneumothorax noted at the right lung base with mediastinal shift to the left, similar to prior chest radiograph. IMPRESSION: Nasoenteric feeding tube tip within the expected distal body of the stomach. Electronically Signed   By: Helyn Numbers MD   On: 07/10/2020 00:49    Assessment/Plan Active Problems:   Acute on chronic respiratory failure with hypoxia (HCC)   COVID-19 virus infection   Pneumonia due to COVID-19 virus   Pneumothorax, left   Metabolic encephalopathy   1. Acute on chronic respiratory failure hypoxia patient's mechanics are being assessed we will try to advance on weaning protocol as tolerated.  We will continue with secretion  management supportive care.  Patient's chest x-ray results were also reviewed 2. COVID-19 virus infection patient has received treatment with steroids fluids antivirals. 3. COVID-19 pneumonia latest chest x-ray showing persistence of infiltrates also has large pneumatoceles at risk for development of pneumothorax. 4. Pneumothorax this had resolved what because of the pneumatocele development patient remains at risk. 5. Metabolic encephalopathy no change we will continue with supportive care.  I have personally seen and evaluated the patient, evaluated laboratory and imaging results, formulated the assessment and plan and placed orders. The Patient requires high complexity decision making with multiple systems involvement.  Case was discussed on Rounds with the Respiratory Therapy Director and the Respiratory staff Time Spent  Yevonne Pax, MD [facility] Pulmonary Critical Care Medicine Sleep Medicine

## 2020-07-11 ENCOUNTER — Encounter: Payer: Self-pay | Admitting: Internal Medicine

## 2020-07-11 DIAGNOSIS — U071 COVID-19: Secondary | ICD-10-CM | POA: Diagnosis present

## 2020-07-11 DIAGNOSIS — J9621 Acute and chronic respiratory failure with hypoxia: Secondary | ICD-10-CM | POA: Diagnosis present

## 2020-07-11 DIAGNOSIS — J939 Pneumothorax, unspecified: Secondary | ICD-10-CM | POA: Diagnosis present

## 2020-07-11 DIAGNOSIS — J1282 Pneumonia due to coronavirus disease 2019: Secondary | ICD-10-CM | POA: Diagnosis present

## 2020-07-11 DIAGNOSIS — G9341 Metabolic encephalopathy: Secondary | ICD-10-CM | POA: Diagnosis present

## 2020-07-11 LAB — CBC
HCT: 25.1 % — ABNORMAL LOW (ref 36.0–46.0)
Hemoglobin: 7.6 g/dL — ABNORMAL LOW (ref 12.0–15.0)
MCH: 30 pg (ref 26.0–34.0)
MCHC: 30.3 g/dL (ref 30.0–36.0)
MCV: 99.2 fL (ref 80.0–100.0)
Platelets: 169 10*3/uL (ref 150–400)
RBC: 2.53 MIL/uL — ABNORMAL LOW (ref 3.87–5.11)
RDW: 19.1 % — ABNORMAL HIGH (ref 11.5–15.5)
WBC: 11 10*3/uL — ABNORMAL HIGH (ref 4.0–10.5)
nRBC: 0.4 % — ABNORMAL HIGH (ref 0.0–0.2)

## 2020-07-11 LAB — BASIC METABOLIC PANEL
Anion gap: 8 (ref 5–15)
BUN: 41 mg/dL — ABNORMAL HIGH (ref 8–23)
CO2: 31 mmol/L (ref 22–32)
Calcium: 8.3 mg/dL — ABNORMAL LOW (ref 8.9–10.3)
Chloride: 103 mmol/L (ref 98–111)
Creatinine, Ser: 0.62 mg/dL (ref 0.44–1.00)
GFR, Estimated: >60 mL/min (ref >60)
Glucose, Bld: 198 mg/dL — ABNORMAL HIGH (ref 70–99)
Potassium: 3.3 mmol/L — ABNORMAL LOW (ref 3.5–5.1)
Sodium: 142 mmol/L (ref 135–145)

## 2020-07-11 LAB — MAGNESIUM: Magnesium: 2.2 mg/dL (ref 1.7–2.4)

## 2020-07-11 LAB — URINE CULTURE: Culture: NO GROWTH

## 2020-07-11 LAB — PHOSPHORUS: Phosphorus: 3.7 mg/dL (ref 2.5–4.6)

## 2020-07-11 NOTE — Progress Notes (Signed)
Pulmonary Critical Care Medicine [facility]   PULMONARY CRITICAL CARE SERVICE  PROGRESS NOTE     Rachel [name]  BZM:[id number]  DOB: Feb 25, 1956   DOA: 07/09/2020  Referring Physician: Carron Curie, MD  HPI: Rachel [name] is a 65 y.o. female seen for follow up of Acute on Chronic Respiratory Failure.  Patient currently is on pressure support has been on 50% FiO2 at a pressure of 12/5  Medications: Reviewed on Rounds  Physical Exam:  Vitals: Temperature is 98.2 pulse 101 respiratory 22 blood pressure is 110/64 saturations 98  Ventilator Settings on pressure support FiO2 50% pressure 12/5  . General: Comfortable at this time . Eyes: Grossly normal lids, irises & conjunctiva . ENT: grossly tongue is normal . Neck: no obvious mass . Cardiovascular: S1 S2 normal no gallop . Respiratory: Coarse breath sounds with few scattered rhonchi . Abdomen: soft . Skin: no rash seen on limited exam . Musculoskeletal: not rigid . Psychiatric:unable to assess . Neurologic: no seizure no involuntary movements         Lab Data:   Basic Metabolic Panel: Recent Labs  Lab 07/05/20 0527 07/06/20 0351 07/07/20 0303 07/08/20 0809 07/09/20 0400 07/10/20 0952 07/11/20 0454  NA 142 142 143 147* 145 147* 142  K 4.1 3.7 3.3* 4.0 3.8 3.8 3.3*  CL 101 100 98 104 102 106 103  CO2 35* 36* 37* 38* 37* 30 31  GLUCOSE 157* 154* 179* 125* 164* 157* 198*  BUN 35* 39* 49* 55* 55* 51* 41*  CREATININE 0.73 0.68 0.72 0.75 0.66 0.62 0.62  CALCIUM 8.4* 8.7* 8.7* 8.7* 8.4* 8.5* 8.3*  MG 2.1 2.1 2.1  --   --  2.3 2.2  PHOS  --   --  4.5  --   --  3.9 3.7    ABG: Recent Labs  Lab 07/10/20 0525  PHART 7.506*  PCO2ART 44.0  PO2ART 73.2*  HCO3 34.8*  O2SAT 96.0    Liver Function Tests: Recent Labs  Lab 07/09/20 0400 07/10/20 0952  AST 48* 54*  ALT 58* 54*  ALKPHOS 75 71  BILITOT 0.5 0.6  PROT 6.3* 6.7  ALBUMIN 2.1* 2.3*   No results for input(s): LIPASE, AMYLASE in  the last 168 hours. No results for input(s): AMMONIA in the last 168 hours.  CBC: Recent Labs  Lab 07/06/20 0351 07/07/20 0303 07/09/20 0400 07/10/20 0952 07/11/20 0454  WBC 8.5 8.9 10.4 14.3* 11.0*  NEUTROABS  --   --  6.2  --   --   HGB 7.6* 7.5* 7.3* 7.9* 7.6*  HCT 25.3* 24.9* 24.8* 27.6* 25.1*  MCV 95.1 95.4 98.0 101.1* 99.2  PLT 175 175 176 192 169    Cardiac Enzymes: No results for input(s): CKTOTAL, CKMB, CKMBINDEX, TROPONINI in the last 168 hours.  BNP (last 3 results) Recent Labs    05/31/20 1252  BNP 76.0    ProBNP (last 3 results) No results for input(s): PROBNP in the last 8760 hours.  Radiological Exams: DG CHEST PORT 1 VIEW  Result Date: 07/10/2020 CLINICAL DATA:  Respiratory failure EXAM: PORTABLE CHEST 1 VIEW COMPARISON:  July 09, 2020 chest radiograph and chest CT July 08, 2020 FINDINGS: Tracheostomy catheter tip is 4.3 cm above the carina. Enteric tube tip is below the diaphragm. Central catheter tip is at the cavoatrial junction. No pneumothorax. Multiple pneumatoceles are again noted occupying much of the right mid and lower lung regions. Apparent small right pleural effusion. There is persistent volume loss  with ill-defined airspace opacity in the right upper lobe. There is patchy airspace opacity in portions of the left upper and left lower lung regions with small left pleural effusion. Heart size normal. Heart shifted toward the left, likely due to the large pneumatoceles on the right. No bone lesions. IMPRESSION: Tube and catheter positions as described without pneumothorax. Extensive pneumatocele formation again noted on the right which deviates the heart toward the left. Patchy airspace opacity in the right upper lobe and in areas of the left upper and left lower lobe regions. Small pleural effusion on each side. Stable cardiac silhouette. Electronically Signed   By: Bretta Bang III M.D.   On: 07/10/2020 08:32   DG Abd Portable 1V  Result Date:  07/10/2020 CLINICAL DATA:  Nasogastric tube placement EXAM: PORTABLE ABDOMEN - 1 VIEW COMPARISON:  None. FINDINGS: Nasoenteric feeding tube tip is seen in the expected distal body of the stomach. The visualized abdominal gas pattern is unremarkable. Small right pleural effusion noted. Large pneumatocele or loculated pneumothorax noted at the right lung base with mediastinal shift to the left, similar to prior chest radiograph. IMPRESSION: Nasoenteric feeding tube tip within the expected distal body of the stomach. Electronically Signed   By: Helyn Numbers MD   On: 07/10/2020 00:49    Assessment/Plan Active Problems:   Acute on chronic respiratory failure with hypoxia (HCC)   COVID-19 virus infection   Pneumonia due to COVID-19 virus   Pneumothorax, left   Metabolic encephalopathy   1. Acute on chronic respiratory failure with hypoxia we will continue with pressure support mode titrate oxygen continue pulmonary toilet. 2. COVID-19 virus infection recovery phase 3. Pneumonia due to COVID-19 treated we will continue to follow 4. Pneumothorax at baseline 5. Metabolic encephalopathy no change we will continue to monitor   I have personally seen and evaluated the patient, evaluated laboratory and imaging results, formulated the assessment and plan and placed orders. The Patient requires high complexity decision making with multiple systems involvement.  Rounds were done with the Respiratory Therapy Director and Staff therapists and discussed with nursing staff also.  Yevonne Pax, MD [facility] Pulmonary Critical Care Medicine Sleep Medicine

## 2020-07-12 LAB — POTASSIUM: Potassium: 3.8 mmol/L (ref 3.5–5.1)

## 2020-07-13 LAB — CBC
HCT: 23.4 % — ABNORMAL LOW (ref 36.0–46.0)
Hemoglobin: 7 g/dL — ABNORMAL LOW (ref 12.0–15.0)
MCH: 28.7 pg (ref 26.0–34.0)
MCHC: 29.9 g/dL — ABNORMAL LOW (ref 30.0–36.0)
MCV: 95.9 fL (ref 80.0–100.0)
Platelets: 159 10*3/uL (ref 150–400)
RBC: 2.44 MIL/uL — ABNORMAL LOW (ref 3.87–5.11)
RDW: 18.6 % — ABNORMAL HIGH (ref 11.5–15.5)
WBC: 11.1 10*3/uL — ABNORMAL HIGH (ref 4.0–10.5)
nRBC: 0.2 % (ref 0.0–0.2)

## 2020-07-13 LAB — BASIC METABOLIC PANEL
Anion gap: 8 (ref 5–15)
BUN: 34 mg/dL — ABNORMAL HIGH (ref 8–23)
CO2: 31 mmol/L (ref 22–32)
Calcium: 8.6 mg/dL — ABNORMAL LOW (ref 8.9–10.3)
Chloride: 97 mmol/L — ABNORMAL LOW (ref 98–111)
Creatinine, Ser: 0.52 mg/dL (ref 0.44–1.00)
GFR, Estimated: >60 mL/min (ref >60)
Glucose, Bld: 131 mg/dL — ABNORMAL HIGH (ref 70–99)
Potassium: 3.5 mmol/L (ref 3.5–5.1)
Sodium: 136 mmol/L (ref 135–145)

## 2020-07-13 LAB — BLOOD GAS, ARTERIAL
Acid-Base Excess: 8.8 mmol/L — ABNORMAL HIGH (ref 0.0–2.0)
Bicarbonate: 33.7 mmol/L — ABNORMAL HIGH (ref 20.0–28.0)
Drawn by: 164
FIO2: 50
O2 Saturation: 96 %
Patient temperature: 37
pCO2 arterial: 54.7 mmHg — ABNORMAL HIGH (ref 32.0–48.0)
pH, Arterial: 7.407 (ref 7.350–7.450)
pO2, Arterial: 82.1 mmHg — ABNORMAL LOW (ref 83.0–108.0)

## 2020-07-13 LAB — PHOSPHORUS: Phosphorus: 4.6 mg/dL (ref 2.5–4.6)

## 2020-07-13 LAB — MAGNESIUM: Magnesium: 2.1 mg/dL (ref 1.7–2.4)

## 2020-07-13 LAB — OCCULT BLOOD X 1 CARD TO LAB, STOOL: Fecal Occult Bld: NEGATIVE

## 2020-07-13 LAB — CULTURE, RESPIRATORY W GRAM STAIN

## 2020-07-14 LAB — CBC
HCT: 27.8 % — ABNORMAL LOW (ref 36.0–46.0)
Hemoglobin: 8.3 g/dL — ABNORMAL LOW (ref 12.0–15.0)
MCH: 29.4 pg (ref 26.0–34.0)
MCHC: 29.9 g/dL — ABNORMAL LOW (ref 30.0–36.0)
MCV: 98.6 fL (ref 80.0–100.0)
Platelets: 195 10*3/uL (ref 150–400)
RBC: 2.82 MIL/uL — ABNORMAL LOW (ref 3.87–5.11)
RDW: 18.5 % — ABNORMAL HIGH (ref 11.5–15.5)
WBC: 16.5 10*3/uL — ABNORMAL HIGH (ref 4.0–10.5)
nRBC: 0 % (ref 0.0–0.2)

## 2020-07-14 LAB — BASIC METABOLIC PANEL
Anion gap: 7 (ref 5–15)
BUN: 32 mg/dL — ABNORMAL HIGH (ref 8–23)
CO2: 33 mmol/L — ABNORMAL HIGH (ref 22–32)
Calcium: 9 mg/dL (ref 8.9–10.3)
Chloride: 97 mmol/L — ABNORMAL LOW (ref 98–111)
Creatinine, Ser: 0.41 mg/dL — ABNORMAL LOW (ref 0.44–1.00)
GFR, Estimated: >60 mL/min (ref >60)
Glucose, Bld: 140 mg/dL — ABNORMAL HIGH (ref 70–99)
Potassium: 4.2 mmol/L (ref 3.5–5.1)
Sodium: 137 mmol/L (ref 135–145)

## 2020-07-14 LAB — MAGNESIUM: Magnesium: 2 mg/dL (ref 1.7–2.4)

## 2020-07-14 LAB — PHOSPHORUS: Phosphorus: 5 mg/dL — ABNORMAL HIGH (ref 2.5–4.6)

## 2020-07-17 ENCOUNTER — Other Ambulatory Visit (HOSPITAL_COMMUNITY): Payer: Self-pay

## 2020-07-17 LAB — CBC
HCT: 24.8 % — ABNORMAL LOW (ref 36.0–46.0)
Hemoglobin: 7.5 g/dL — ABNORMAL LOW (ref 12.0–15.0)
MCH: 29.9 pg (ref 26.0–34.0)
MCHC: 30.2 g/dL (ref 30.0–36.0)
MCV: 98.8 fL (ref 80.0–100.0)
Platelets: 214 10*3/uL (ref 150–400)
RBC: 2.51 MIL/uL — ABNORMAL LOW (ref 3.87–5.11)
RDW: 18.6 % — ABNORMAL HIGH (ref 11.5–15.5)
WBC: 7.5 10*3/uL (ref 4.0–10.5)
nRBC: 0 % (ref 0.0–0.2)

## 2020-07-17 LAB — BASIC METABOLIC PANEL
Anion gap: 7 (ref 5–15)
BUN: 20 mg/dL (ref 8–23)
CO2: 34 mmol/L — ABNORMAL HIGH (ref 22–32)
Calcium: 8.6 mg/dL — ABNORMAL LOW (ref 8.9–10.3)
Chloride: 96 mmol/L — ABNORMAL LOW (ref 98–111)
Creatinine, Ser: 0.4 mg/dL — ABNORMAL LOW (ref 0.44–1.00)
GFR, Estimated: >60 mL/min (ref >60)
Glucose, Bld: 130 mg/dL — ABNORMAL HIGH (ref 70–99)
Potassium: 4.5 mmol/L (ref 3.5–5.1)
Sodium: 137 mmol/L (ref 135–145)

## 2020-07-17 LAB — MAGNESIUM: Magnesium: 2 mg/dL (ref 1.7–2.4)

## 2020-07-19 ENCOUNTER — Other Ambulatory Visit (HOSPITAL_COMMUNITY): Payer: Self-pay

## 2020-07-19 LAB — CBC
HCT: 26.2 % — ABNORMAL LOW (ref 36.0–46.0)
Hemoglobin: 7.6 g/dL — ABNORMAL LOW (ref 12.0–15.0)
MCH: 28.8 pg (ref 26.0–34.0)
MCHC: 29 g/dL — ABNORMAL LOW (ref 30.0–36.0)
MCV: 99.2 fL (ref 80.0–100.0)
Platelets: 244 10*3/uL (ref 150–400)
RBC: 2.64 MIL/uL — ABNORMAL LOW (ref 3.87–5.11)
RDW: 19.9 % — ABNORMAL HIGH (ref 11.5–15.5)
WBC: 10.1 10*3/uL (ref 4.0–10.5)
nRBC: 0 % (ref 0.0–0.2)

## 2020-07-21 LAB — BASIC METABOLIC PANEL
Anion gap: 7 (ref 5–15)
BUN: 21 mg/dL (ref 8–23)
CO2: 37 mmol/L — ABNORMAL HIGH (ref 22–32)
Calcium: 8.7 mg/dL — ABNORMAL LOW (ref 8.9–10.3)
Chloride: 96 mmol/L — ABNORMAL LOW (ref 98–111)
Creatinine, Ser: 0.33 mg/dL — ABNORMAL LOW (ref 0.44–1.00)
GFR, Estimated: >60 mL/min (ref >60)
Glucose, Bld: 149 mg/dL — ABNORMAL HIGH (ref 70–99)
Potassium: 4.5 mmol/L (ref 3.5–5.1)
Sodium: 140 mmol/L (ref 135–145)

## 2020-07-21 LAB — CBC
HCT: 26.2 % — ABNORMAL LOW (ref 36.0–46.0)
Hemoglobin: 7.9 g/dL — ABNORMAL LOW (ref 12.0–15.0)
MCH: 30.6 pg (ref 26.0–34.0)
MCHC: 30.2 g/dL (ref 30.0–36.0)
MCV: 101.6 fL — ABNORMAL HIGH (ref 80.0–100.0)
Platelets: 217 10*3/uL (ref 150–400)
RBC: 2.58 MIL/uL — ABNORMAL LOW (ref 3.87–5.11)
RDW: 20.8 % — ABNORMAL HIGH (ref 11.5–15.5)
WBC: 11.6 10*3/uL — ABNORMAL HIGH (ref 4.0–10.5)
nRBC: 0.2 % (ref 0.0–0.2)

## 2020-07-21 LAB — MAGNESIUM: Magnesium: 2 mg/dL (ref 1.7–2.4)

## 2020-07-22 ENCOUNTER — Other Ambulatory Visit (HOSPITAL_COMMUNITY): Payer: Self-pay

## 2020-07-22 NOTE — Progress Notes (Signed)
Pulmonary Critical Care Medicine [facility]   PULMONARY CRITICAL CARE SERVICE  PROGRESS NOTE     Rachel [name]  NWG:[id number]  DOB: 04-May-1955   DOA: 07/09/2020  Referring Physician: Carron Curie, MD  HPI: Rachel [name] is a 65 y.o. female seen for follow up of Acute on Chronic Respiratory Failure.  She is awake comfortable without distress she continues to not a able to tolerate weaning.  I reviewed the CT scan of her chest done shows a rather large pneumatocele which has been enlarging over time since March.  The patient had a chest x-ray done back in 2010 which I looked at and her lungs looked relatively normal.  She was deemed not a candidate for any surgical intervention by cardiothoracic while she was in acute care.  Medications: Reviewed on Rounds  Physical Exam:  Vitals: Temperature is 98.4 pulse 91 respiratory rate 17 blood pressure is 152/77 saturations 100%  Ventilator Settings on assist control FiO2 50% tidal volume 350  . General: Comfortable at this time . Eyes: Grossly normal lids, irises & conjunctiva . ENT: grossly tongue is normal . Neck: no obvious mass . Cardiovascular: S1 S2 normal no gallop . Respiratory: Coarse rhonchi expansion is equal . Abdomen: soft . Skin: no rash seen on limited exam . Musculoskeletal: not rigid . Psychiatric:unable to assess . Neurologic: no seizure no involuntary movements         Lab Data:   Basic Metabolic Panel: Recent Labs  Lab 07/17/20 0518 07/21/20 0252  NA 137 140  K 4.5 4.5  CL 96* 96*  CO2 34* 37*  GLUCOSE 130* 149*  BUN 20 21  CREATININE 0.40* 0.33*  CALCIUM 8.6* 8.7*  MG 2.0 2.0    ABG: No results for input(s): PHART, PCO2ART, PO2ART, HCO3, O2SAT in the last 168 hours.  Liver Function Tests: No results for input(s): AST, ALT, ALKPHOS, BILITOT, PROT, ALBUMIN in the last 168 hours. No results for input(s): LIPASE, AMYLASE in the last 168 hours. No results for input(s): AMMONIA  in the last 168 hours.  CBC: Recent Labs  Lab 07/17/20 0518 07/19/20 0426 07/21/20 0252  WBC 7.5 10.1 11.6*  HGB 7.5* 7.6* 7.9*  HCT 24.8* 26.2* 26.2*  MCV 98.8 99.2 101.6*  PLT 214 244 217    Cardiac Enzymes: No results for input(s): CKTOTAL, CKMB, CKMBINDEX, TROPONINI in the last 168 hours.  BNP (last 3 results) Recent Labs    05/31/20 1252  BNP 76.0    ProBNP (last 3 results) No results for input(s): PROBNP in the last 8760 hours.  Radiological Exams: DG Chest Port 1 View  Result Date: 07/22/2020 CLINICAL DATA:  65 year old female status post COVID-19 with severe lung disease, large right pneumatocele. EXAM: PORTABLE CHEST 1 VIEW COMPARISON:  Portable chest 07/19/2020 and earlier including chest CT 07/08/2020. FINDINGS: Portable AP semi upright view at 0629 hours. Stable tracheostomy, right PICC line, visible enteric feeding tube. Hyperinflated right lung with large lower lung cavity or pneumatocele, now about 20 cm diameter versus 17 cm late last month. No communicating pneumothorax. Decreased left lung base ventilation from last week and mildly increased leftward mediastinal shift, lower left lung volume. No left-side pneumothorax or pleural effusion is evident. IMPRESSION: 1. Decreasing left lung ventilation appears related to large and increasing right lung pneumatocele (now 20 cm diameter) with mass effect on the mediastinum. Recommend Thoracic Surgery consultation. 2.  Stable lines and tubes. Electronically Signed   By: Odessa Fleming M.D.   On:  07/22/2020 07:11    Assessment/Plan Active Problems:   Acute on chronic respiratory failure with hypoxia (HCC)   COVID-19 virus infection   Pneumonia due to COVID-19 virus   Pneumothorax, left   Metabolic encephalopathy   1. Acute on chronic respiratory failure hypoxia patient has poor prognosis as far as being able to wean off the ventilator.  I did speak with respiratory therapy during rounds suggested switching over to pressure  control mode out ever she could be at increased risk of pneumothorax if any significant change in her compliance were to occur so we will need to keep a very close eye on her airway pressures. 2. COVID-19 virus infection in recovery 3. Pneumonia due to COVID-19 has been treated. 4. 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 5. Metabolic encephalopathy no change noted overall   I have personally seen and evaluated the patient, evaluated laboratory and imaging results, formulated the assessment and plan and placed orders. The Patient requires high complexity decision making with multiple systems involvement.  Rounds were done with the Respiratory Therapy Director and Staff therapists and discussed with nursing staff also.  Yevonne Pax, MD [facility] Pulmonary Critical Care Medicine Sleep Medicine

## 2020-07-23 NOTE — Progress Notes (Signed)
Pulmonary Critical Care Medicine [facility]   PULMONARY CRITICAL CARE SERVICE  PROGRESS NOTE     Rachel [name]  HQI:[id number]  DOB: [birth year]   DOA: 07/09/2020  Referring Physician: Carron Curie, MD  HPI: Rachel [name] is a 65 y.o. female seen for follow up of Acute on Chronic Respiratory Failure.  Patient remains on the ventilator right now on full support.  Not tolerating any weaning today.  Medications: Reviewed on Rounds  Physical Exam:  Vitals: Temperature is 97.8 pulse 99 respiratory rate 18 blood pressure is 111/73 saturations 100%  Ventilator Settings on pressure assist control FiO2 50% IP 25 PEEP 5  . General: Comfortable at this time . Eyes: Grossly normal lids, irises & conjunctiva . ENT: grossly tongue is normal . Neck: no obvious mass . Cardiovascular: S1 S2 normal no gallop . Respiratory: Scattered rhonchi expansion is equal . Abdomen: soft . Skin: no rash seen on limited exam . Musculoskeletal: not rigid . Psychiatric:unable to assess . Neurologic: no seizure no involuntary movements         Lab Data:   Basic Metabolic Panel: Recent Labs  Lab 07/17/20 0518 07/21/20 0252  NA 137 140  K 4.5 4.5  CL 96* 96*  CO2 34* 37*  GLUCOSE 130* 149*  BUN 20 21  CREATININE 0.40* 0.33*  CALCIUM 8.6* 8.7*  MG 2.0 2.0    ABG: No results for input(s): PHART, PCO2ART, PO2ART, HCO3, O2SAT in the last 168 hours.  Liver Function Tests: No results for input(s): AST, ALT, ALKPHOS, BILITOT, PROT, ALBUMIN in the last 168 hours. No results for input(s): LIPASE, AMYLASE in the last 168 hours. No results for input(s): AMMONIA in the last 168 hours.  CBC: Recent Labs  Lab 07/17/20 0518 07/19/20 0426 07/21/20 0252  WBC 7.5 10.1 11.6*  HGB 7.5* 7.6* 7.9*  HCT 24.8* 26.2* 26.2*  MCV 98.8 99.2 101.6*  PLT 214 244 217    Cardiac Enzymes: No results for input(s): CKTOTAL, CKMB, CKMBINDEX, TROPONINI in the last 168 hours.  BNP (last 3  results) Recent Labs    05/31/20 1252  BNP 76.0    ProBNP (last 3 results) No results for input(s): PROBNP in the last 8760 hours.  Radiological Exams: DG Chest Port 1 View  Result Date: 07/22/2020 CLINICAL DATA:  65 year old female status post COVID-19 with severe lung disease, large right pneumatocele. EXAM: PORTABLE CHEST 1 VIEW COMPARISON:  Portable chest 07/19/2020 and earlier including chest CT 07/08/2020. FINDINGS: Portable AP semi upright view at 0629 hours. Stable tracheostomy, right PICC line, visible enteric feeding tube. Hyperinflated right lung with large lower lung cavity or pneumatocele, now about 20 cm diameter versus 17 cm late last month. No communicating pneumothorax. Decreased left lung base ventilation from last week and mildly increased leftward mediastinal shift, lower left lung volume. No left-side pneumothorax or pleural effusion is evident. IMPRESSION: 1. Decreasing left lung ventilation appears related to large and increasing right lung pneumatocele (now 20 cm diameter) with mass effect on the mediastinum. Recommend Thoracic Surgery consultation. 2.  Stable lines and tubes. Electronically Signed   By: Rachel [name] M.D.   On: 07/22/2020 07:11    Assessment/Plan Active Problems:   Acute on chronic respiratory failure with hypoxia (HCC)   COVID-19 virus infection   Pneumonia due to COVID-19 virus   Pneumothorax, left   Metabolic encephalopathy   1. Acute on chronic respiratory failure with hypoxia I gave parameters to the respiratory therapist regarding pressure requirements.  Also  monitor for any change tidal volumes. 2. COVID-19 virus infection recovery we will continue to monitor 3. Pneumonia due to COVID-19 treated improving 4. Pneumothorax has been no change 5. Metabolic encephalopathy patient appears to be at baseline   I have personally seen and evaluated the patient, evaluated laboratory and imaging results, formulated the assessment and plan and placed  orders. The Patient requires high complexity decision making with multiple systems involvement.  Rounds were done with the Respiratory Therapy Director and Staff therapists and discussed with nursing staff also.  Yevonne Pax, MD [facility] Pulmonary Critical Care Medicine Sleep Medicine

## 2020-07-24 LAB — CBC
HCT: 25.3 % — ABNORMAL LOW (ref 36.0–46.0)
Hemoglobin: 7.6 g/dL — ABNORMAL LOW (ref 12.0–15.0)
MCH: 30.5 pg (ref 26.0–34.0)
MCHC: 30 g/dL (ref 30.0–36.0)
MCV: 101.6 fL — ABNORMAL HIGH (ref 80.0–100.0)
Platelets: 180 10*3/uL (ref 150–400)
RBC: 2.49 MIL/uL — ABNORMAL LOW (ref 3.87–5.11)
RDW: 20.8 % — ABNORMAL HIGH (ref 11.5–15.5)
WBC: 10.3 10*3/uL (ref 4.0–10.5)
nRBC: 0 % (ref 0.0–0.2)

## 2020-07-24 LAB — PHOSPHORUS: Phosphorus: 3.3 mg/dL (ref 2.5–4.6)

## 2020-07-24 LAB — BASIC METABOLIC PANEL
Anion gap: 9 (ref 5–15)
BUN: 14 mg/dL (ref 8–23)
CO2: 40 mmol/L — ABNORMAL HIGH (ref 22–32)
Calcium: 8.9 mg/dL (ref 8.9–10.3)
Chloride: 88 mmol/L — ABNORMAL LOW (ref 98–111)
Creatinine, Ser: <0.3 mg/dL — ABNORMAL LOW (ref 0.44–1.00)
Glucose, Bld: 146 mg/dL — ABNORMAL HIGH (ref 70–99)
Potassium: 3.4 mmol/L — ABNORMAL LOW (ref 3.5–5.1)
Sodium: 137 mmol/L (ref 135–145)

## 2020-07-24 LAB — MAGNESIUM: Magnesium: 1.8 mg/dL (ref 1.7–2.4)

## 2020-07-24 NOTE — Progress Notes (Signed)
Pulmonary Critical Care Medicine [facility]   PULMONARY CRITICAL CARE SERVICE  PROGRESS NOTE     Rachel [name]  DJS:[id number]  DOB: 1956-03-07   DOA: 07/09/2020  Referring Physician: Carron Curie, MD  HPI: Rachel [name] is a 65 y.o. female seen for follow up of Acute on Chronic Respiratory Failure.  Continues to not do well as far as weaning is concerned remains on the ventilator and pressure assist control mode  Medications: Reviewed on Rounds  Physical Exam:  Vitals: Temperature is 98.0 pulse 108 respiratory 15 blood pressure is 128/75 saturations 100%  Ventilator Settings on pressure assist control FiO2 50% IP 20 PEEP 5  . General: Comfortable at this time . Eyes: Grossly normal lids, irises & conjunctiva . ENT: grossly tongue is normal . Neck: no obvious mass . Cardiovascular: S1 S2 normal no gallop . Respiratory: Scattered rhonchi expansion is equal . Abdomen: soft . Skin: no rash seen on limited exam . Musculoskeletal: not rigid . Psychiatric:unable to assess . Neurologic: no seizure no involuntary movements         Lab Data:   Basic Metabolic Panel: Recent Labs  Lab 07/21/20 0252 07/24/20 0540  NA 140 137  K 4.5 3.4*  CL 96* 88*  CO2 37* 40*  GLUCOSE 149* 146*  BUN 21 14  CREATININE 0.33* <0.30*  CALCIUM 8.7* 8.9  MG 2.0 1.8  PHOS  --  3.3    ABG: No results for input(s): PHART, PCO2ART, PO2ART, HCO3, O2SAT in the last 168 hours.  Liver Function Tests: No results for input(s): AST, ALT, ALKPHOS, BILITOT, PROT, ALBUMIN in the last 168 hours. No results for input(s): LIPASE, AMYLASE in the last 168 hours. No results for input(s): AMMONIA in the last 168 hours.  CBC: Recent Labs  Lab 07/19/20 0426 07/21/20 0252 07/24/20 0540  WBC 10.1 11.6* 10.3  HGB 7.6* 7.9* 7.6*  HCT 26.2* 26.2* 25.3*  MCV 99.2 101.6* 101.6*  PLT 244 217 180    Cardiac Enzymes: No results for input(s): CKTOTAL, CKMB, CKMBINDEX, TROPONINI in the  last 168 hours.  BNP (last 3 results) Recent Labs    05/31/20 1252  BNP 76.0    ProBNP (last 3 results) No results for input(s): PROBNP in the last 8760 hours.  Radiological Exams: No results found.  Assessment/Plan Active Problems:   Acute on chronic respiratory failure with hypoxia (HCC)   COVID-19 virus infection   Pneumonia due to COVID-19 virus   Pneumothorax, left   Metabolic encephalopathy   1. Acute on chronic respiratory failure hypoxia we will continue with pressure assist control titrate oxygen continue pulmonary toilet. 2. Pneumatocele large Merocel unchanged we will continue with supportive care 3. Pneumonia due to COVID-19 has been treated 4. COVID-19 viral infection in recovery 5. Metabolic encephalopathy no change we will continue to monitor closely.   I have personally seen and evaluated the patient, evaluated laboratory and imaging results, formulated the assessment and plan and placed orders. The Patient requires high complexity decision making with multiple systems involvement.  Rounds were done with the Respiratory Therapy Director and Staff therapists and discussed with nursing staff also.  Yevonne Pax, MD [facility] Pulmonary Critical Care Medicine Sleep Medicine

## 2020-07-25 ENCOUNTER — Other Ambulatory Visit (HOSPITAL_COMMUNITY): Payer: Self-pay

## 2020-07-25 LAB — POTASSIUM: Potassium: 3.8 mmol/L (ref 3.5–5.1)

## 2020-07-25 NOTE — Progress Notes (Signed)
Pulmonary Critical Care Medicine [facility]   PULMONARY CRITICAL CARE SERVICE  PROGRESS NOTE     Rachel [name]  YPP:[id number]  DOB: 04-13-1956   DOA: 07/09/2020  Referring Physician: Carron Curie, MD  HPI: Rachel [name] is a 65 y.o. female seen for follow up of Acute on Chronic Respiratory Failure.  Patient is on the ventilator and full support still doing about the same.  No major improvements noted on the x-rays  Medications: Reviewed on Rounds  Physical Exam:  Vitals: Temperature is 98.1 pulse 92 respiratory 29 blood pressure is 153/92 saturations 92%  Ventilator Settings pressure assist control FiO2 65% IP 25 PEEP 5  . General: Comfortable at this time . Eyes: Grossly normal lids, irises & conjunctiva . ENT: grossly tongue is normal . Neck: no obvious mass . Cardiovascular: S1 S2 normal no gallop . Respiratory: Scattered rhonchi expansion is equal . Abdomen: soft . Skin: no rash seen on limited exam . Musculoskeletal: not rigid . Psychiatric:unable to assess . Neurologic: no seizure no involuntary movements         Lab Data:   Basic Metabolic Panel: Recent Labs  Lab 07/21/20 0252 07/24/20 0540 07/25/20 0442  NA 140 137  --   K 4.5 3.4* 3.8  CL 96* 88*  --   CO2 37* 40*  --   GLUCOSE 149* 146*  --   BUN 21 14  --   CREATININE 0.33* <0.30*  --   CALCIUM 8.7* 8.9  --   MG 2.0 1.8  --   PHOS  --  3.3  --     ABG: No results for input(s): PHART, PCO2ART, PO2ART, HCO3, O2SAT in the last 168 hours.  Liver Function Tests: No results for input(s): AST, ALT, ALKPHOS, BILITOT, PROT, ALBUMIN in the last 168 hours. No results for input(s): LIPASE, AMYLASE in the last 168 hours. No results for input(s): AMMONIA in the last 168 hours.  CBC: Recent Labs  Lab 07/19/20 0426 07/21/20 0252 07/24/20 0540  WBC 10.1 11.6* 10.3  HGB 7.6* 7.9* 7.6*  HCT 26.2* 26.2* 25.3*  MCV 99.2 101.6* 101.6*  PLT 244 217 180    Cardiac Enzymes: No  results for input(s): CKTOTAL, CKMB, CKMBINDEX, TROPONINI in the last 168 hours.  BNP (last 3 results) Recent Labs    05/31/20 1252  BNP 76.0    ProBNP (last 3 results) No results for input(s): PROBNP in the last 8760 hours.  Radiological Exams: DG CHEST PORT 1 VIEW  Result Date: 07/25/2020 CLINICAL DATA:  Respiratory failure EXAM: PORTABLE CHEST 1 VIEW COMPARISON:  07/22/2020, 07/17/2020, 07/05/2020 FINDINGS: Tracheostomy, nasoenteric feeding tube extending into the abdomen, and right upper extremity PICC line with its tip within the superior vena cava are unchanged. Large right basilar cavitary lesion is again identified and appears stable since immediate prior examination, resulting in hyperinflation of the right hemithorax, marked mediastinal shift to the left, and marked depression of the right hemidiaphragm. Small right pleural effusion may be present. Focal pulmonary infiltrate within the residual, compressed right upper lobe has essentially resolved. Diffuse pulmonary infiltrate is again seen throughout the left lung which may relate, in part, to vascular shunting to the left lung as well as compression of the left hemithorax. No pneumothorax or pleural effusion on the left. Cardiac size within normal limits. IMPRESSION: Stable support lines and tubes. Unchanged large cavitary lesion within the right lung base demonstrating marked mass effect upon the mediastinum and right hemidiaphragm. This results in persistent  apparent infiltrate within the left lung which likely represents a combination of compressed parenchyma as well as vascular shunting from the heavily diseased right lung. Interval resolution of pulmonary infiltrate within the right upper lobe. Electronically Signed   By: Helyn Numbers MD   On: 07/25/2020 06:02    Assessment/Plan Active Problems:   Acute on chronic respiratory failure with hypoxia (HCC)   COVID-19 virus infection   Pneumonia due to COVID-19 virus    Pneumothorax, left   Metabolic encephalopathy   1. Acute on chronic respiratory failure with hypoxia we will continue with pressure assist control mode on 65% FiO2 we will continue 25 PEEP 2. COVID-19 virus infection recovery 3. Pneumothorax Patient has residual large pneumatocele 4. Pneumonia due to COVID-19 is being treated we will continue supportive care   I have personally seen and evaluated the patient, evaluated laboratory and imaging results, formulated the assessment and plan and placed orders. The Patient requires high complexity decision making with multiple systems involvement.  Rounds were done with the Respiratory Therapy Director and Staff therapists and discussed with nursing staff also.  Yevonne Pax, MD [facility] Pulmonary Critical Care Medicine Sleep Medicine

## 2020-07-26 LAB — BASIC METABOLIC PANEL
Anion gap: 11 (ref 5–15)
BUN: 15 mg/dL (ref 8–23)
CO2: 41 mmol/L — ABNORMAL HIGH (ref 22–32)
Calcium: 8.9 mg/dL (ref 8.9–10.3)
Chloride: 84 mmol/L — ABNORMAL LOW (ref 98–111)
Creatinine, Ser: 0.34 mg/dL — ABNORMAL LOW (ref 0.44–1.00)
GFR, Estimated: >60 mL/min (ref >60)
Glucose, Bld: 202 mg/dL — ABNORMAL HIGH (ref 70–99)
Potassium: 4.2 mmol/L (ref 3.5–5.1)
Sodium: 136 mmol/L (ref 135–145)

## 2020-07-26 LAB — CBC
HCT: 26.1 % — ABNORMAL LOW (ref 36.0–46.0)
Hemoglobin: 7.8 g/dL — ABNORMAL LOW (ref 12.0–15.0)
MCH: 30.4 pg (ref 26.0–34.0)
MCHC: 29.9 g/dL — ABNORMAL LOW (ref 30.0–36.0)
MCV: 101.6 fL — ABNORMAL HIGH (ref 80.0–100.0)
Platelets: 160 10*3/uL (ref 150–400)
RBC: 2.57 MIL/uL — ABNORMAL LOW (ref 3.87–5.11)
RDW: 19.9 % — ABNORMAL HIGH (ref 11.5–15.5)
WBC: 12.3 10*3/uL — ABNORMAL HIGH (ref 4.0–10.5)
nRBC: 0 % (ref 0.0–0.2)

## 2020-07-26 LAB — MAGNESIUM: Magnesium: 2 mg/dL (ref 1.7–2.4)

## 2020-07-26 LAB — PHOSPHORUS: Phosphorus: 3.1 mg/dL (ref 2.5–4.6)

## 2020-07-26 NOTE — Progress Notes (Signed)
Pulmonary Critical Care Medicine [facility]   PULMONARY CRITICAL CARE SERVICE  PROGRESS NOTE     Rachel [name]  [id number]:[id number]  DOB: Apr 16, 1955   DOA: 07/09/2020  Referring Physician: Carron Curie, MD  HPI: Rachel [name] is a 65 y.o. female seen for follow up of Acute on Chronic Respiratory Failure.  Had a very lengthy discussion with the patient the husband and the daughter in the room along with case management.  Reviewed the x-rays that were available to [place].  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  In addition also explained to them that she is at risk for acute complications to include pneumothorax as a possibility also  Medications: Reviewed on Rounds  Physical Exam:  Vitals: Temperature is 97.9 pulse 81 respiratory rate 20 blood pressure 121/70 saturations 96%  Ventilator Settings on pressure assist control FiO2 as 55% IP 25 PEEP 5  . General: Comfortable at this time . Eyes: Grossly normal lids, irises & conjunctiva . ENT: grossly tongue is normal . Neck: no obvious mass . Cardiovascular: S1 S2 normal no gallop . Respiratory: No rhonchi no rales noted at this time . Abdomen: soft . Skin: no rash seen on limited exam . Musculoskeletal: not rigid . Psychiatric:unable to assess . Neurologic: no seizure no involuntary movements         Lab Data:   Basic Metabolic Panel: Recent Labs  Lab 07/21/20 0252 07/24/20 0540 07/25/20 0442 07/26/20 0422  NA 140 137  --  136  K 4.5 3.4* 3.8 4.2  CL 96* 88*  --  84*  CO2 37* 40*  --  41*  GLUCOSE 149* 146*  --  202*  BUN 21 14  --  15  CREATININE 0.33* <0.30*  --   0.34*  CALCIUM 8.7* 8.9  --  8.9  MG 2.0 1.8  --  2.0  PHOS  --  3.3  --  3.1    ABG: No results for input(s): PHART, PCO2ART, PO2ART, HCO3, O2SAT in the last 168 hours.  Liver Function Tests: No results for input(s): AST, ALT, ALKPHOS, BILITOT, PROT, ALBUMIN in the last 168 hours. No results for input(s): LIPASE, AMYLASE in the last 168 hours. No results for input(s): AMMONIA in the last 168 hours.  CBC: Recent Labs  Lab 07/21/20 0252 07/24/20 0540 07/26/20 0422  WBC 11.6* 10.3 12.3*  HGB 7.9* 7.6* 7.8*  HCT 26.2* 25.3* 26.1*  MCV 101.6* 101.6* 101.6*  PLT 217 180 160    Cardiac Enzymes: No results for input(s): CKTOTAL, CKMB, CKMBINDEX, TROPONINI in the last 168 hours.  BNP (last 3 results) Recent Labs    05/31/20 1252  BNP 76.0    ProBNP (last 3 results) No results for input(s): PROBNP in the last 8760 hours.  Radiological Exams: DG CHEST PORT 1 VIEW  Result Date: 07/25/2020 CLINICAL DATA:  Respiratory failure EXAM: PORTABLE CHEST 1 VIEW COMPARISON:  07/22/2020, 07/17/2020, 07/05/2020 FINDINGS: Tracheostomy, nasoenteric feeding tube extending into the abdomen, and right upper extremity PICC line with its tip within the superior vena cava are unchanged. Large right basilar cavitary lesion is again identified and  appears stable since immediate prior examination, resulting in hyperinflation of the right hemithorax, marked mediastinal shift to the left, and marked depression of the right hemidiaphragm. Small right pleural effusion may be present. Focal pulmonary infiltrate within the residual, compressed right upper lobe has essentially resolved. Diffuse pulmonary infiltrate is again seen throughout the left lung which may relate, in part, to vascular shunting to the left lung as well as compression of the left hemithorax. No pneumothorax or pleural effusion on the left. Cardiac size within normal limits. IMPRESSION: Stable support lines and tubes. Unchanged large cavitary  lesion within the right lung base demonstrating marked mass effect upon the mediastinum and right hemidiaphragm. This results in persistent apparent infiltrate within the left lung which likely represents a combination of compressed parenchyma as well as vascular shunting from the heavily diseased right lung. Interval resolution of pulmonary infiltrate within the right upper lobe. Electronically Signed   By: Helyn Numbers MD   On: 07/25/2020 06:02    Assessment/Plan Active Problems:   Acute on chronic respiratory failure with hypoxia (HCC)   COVID-19 virus infection   Pneumonia due to COVID-19 virus   Pneumothorax, left   Metabolic encephalopathy   1. Acute on chronic respiratory failure hypoxia we will continue with full support on pressure control mode currently is on 55% FiO2. 2. COVID-19 virus infection recovery we will continue to follow 3. Pneumothorax has resolved but has a large pneumatocele with no change we will continue with supportive care.  She is at risk for another pneumothorax which could prove to be deadly 4. Pneumonia due to COVID-19 severe disease with residual pulmonary damage 5. Metabolic encephalopathy patient is at baseline   I have personally seen and evaluated the patient, evaluated laboratory and imaging results, formulated the assessment and plan and placed orders. The Patient requires high complexity decision making with multiple systems involvement.  Rounds were done with the Respiratory Therapy Director and Staff therapists and discussed with nursing staff also.  Yevonne Pax, MD [facility] Pulmonary Critical Care Medicine Sleep Medicine

## 2020-07-28 LAB — CBC
HCT: 25.5 % — ABNORMAL LOW (ref 36.0–46.0)
Hemoglobin: 7.6 g/dL — ABNORMAL LOW (ref 12.0–15.0)
MCH: 30.3 pg (ref 26.0–34.0)
MCHC: 29.8 g/dL — ABNORMAL LOW (ref 30.0–36.0)
MCV: 101.6 fL — ABNORMAL HIGH (ref 80.0–100.0)
Platelets: 214 10*3/uL (ref 150–400)
RBC: 2.51 MIL/uL — ABNORMAL LOW (ref 3.87–5.11)
RDW: 19.5 % — ABNORMAL HIGH (ref 11.5–15.5)
WBC: 9.8 10*3/uL (ref 4.0–10.5)
nRBC: 0 % (ref 0.0–0.2)

## 2020-07-28 LAB — BASIC METABOLIC PANEL
Anion gap: 11 (ref 5–15)
BUN: 24 mg/dL — ABNORMAL HIGH (ref 8–23)
CO2: 42 mmol/L — ABNORMAL HIGH (ref 22–32)
Calcium: 8.7 mg/dL — ABNORMAL LOW (ref 8.9–10.3)
Chloride: 85 mmol/L — ABNORMAL LOW (ref 98–111)
Creatinine, Ser: 0.4 mg/dL — ABNORMAL LOW (ref 0.44–1.00)
GFR, Estimated: >60 mL/min (ref >60)
Glucose, Bld: 180 mg/dL — ABNORMAL HIGH (ref 70–99)
Potassium: 4.3 mmol/L (ref 3.5–5.1)
Sodium: 138 mmol/L (ref 135–145)

## 2020-07-28 LAB — MAGNESIUM: Magnesium: 2.1 mg/dL (ref 1.7–2.4)

## 2020-07-28 LAB — PHOSPHORUS: Phosphorus: 3.8 mg/dL (ref 2.5–4.6)

## 2020-07-29 ENCOUNTER — Other Ambulatory Visit (HOSPITAL_COMMUNITY): Payer: Self-pay

## 2020-07-29 NOTE — Progress Notes (Signed)
Pulmonary Critical Care Medicine [facility]   PULMONARY CRITICAL CARE SERVICE  PROGRESS NOTE     Rachel [name]  GGY:[id number]  DOB: [birth year]   DOA: 07/09/2020  Referring Physician: Carron Curie, MD  HPI: Rachel [name] is a 65 y.o. female seen for follow up of Acute on Chronic Respiratory Failure.  Discussed on rounds extensively.  Apparently now the family is wanting to take her home on the ventilator.  Discharge planning is working on getting this arranged  Medications: Reviewed on Rounds  Physical Exam:  Vitals: Temperature is 98.0 pulse 90 respiratory rate is 20 blood pressure is 131/76 saturations 99%  Ventilator Settings on pressure assist control FiO2 55% IP 25 PEEP 5  . General: Comfortable at this time . Eyes: Grossly normal lids, irises & conjunctiva . ENT: grossly tongue is normal . Neck: no obvious mass . Cardiovascular: S1 S2 normal no gallop . Respiratory: No rhonchi very coarse breath sounds . Abdomen: soft . Skin: no rash seen on limited exam . Musculoskeletal: not rigid . Psychiatric:unable to assess . Neurologic: no seizure no involuntary movements         Lab Data:   Basic Metabolic Panel: Recent Labs  Lab 07/24/20 0540 07/25/20 0442 07/26/20 0422 07/28/20 0414  NA 137  --  136 138  K 3.4* 3.8 4.2 4.3  CL 88*  --  84* 85*  CO2 40*  --  41* 42*  GLUCOSE 146*  --  202* 180*  BUN 14  --  15 24*  CREATININE <0.30*  --  0.34* 0.40*  CALCIUM 8.9  --  8.9 8.7*  MG 1.8  --  2.0 2.1  PHOS 3.3  --  3.1 3.8    ABG: No results for input(s): PHART, PCO2ART, PO2ART, HCO3, O2SAT in the last 168 hours.  Liver Function Tests: No results for input(s): AST, ALT, ALKPHOS, BILITOT, PROT, ALBUMIN in the last 168 hours. No results for input(s): LIPASE, AMYLASE in the last 168 hours. No results for input(s): AMMONIA in the last 168 hours.  CBC: Recent Labs  Lab 07/24/20 0540 07/26/20 0422 07/28/20 0414  WBC 10.3 12.3* 9.8  HGB  7.6* 7.8* 7.6*  HCT 25.3* 26.1* 25.5*  MCV 101.6* 101.6* 101.6*  PLT 180 160 214    Cardiac Enzymes: No results for input(s): CKTOTAL, CKMB, CKMBINDEX, TROPONINI in the last 168 hours.  BNP (last 3 results) Recent Labs    05/31/20 1252  BNP 76.0    ProBNP (last 3 results) No results for input(s): PROBNP in the last 8760 hours.  Radiological Exams: DG Abd Portable 1V  Result Date: 07/29/2020 CLINICAL DATA:  NG tube placement. EXAM: PORTABLE ABDOMEN - 1 VIEW COMPARISON:  07/10/2020 FINDINGS: The NG tube tip is in the antropyloric region of the stomach. The bowel gas pattern is unremarkable. Stable hyperinflation of the right lung IMPRESSION: NG tube tip in the antropyloric region of the stomach. Electronically Signed   By: Rudie Meyer M.D.   On: 07/29/2020 13:18    Assessment/Plan Active Problems:   Acute on chronic respiratory failure with hypoxia (HCC)   COVID-19 virus infection   Pneumonia due to COVID-19 virus   Pneumothorax, left   Metabolic encephalopathy   1. Acute on chronic respiratory failure with hypoxia plan is going to be to continue with the pressure assist control mode will titrate as tolerated. 2. COVID-19 virus infection in recovery phase we will continue to follow along. 3. Pneumonia due to COVID-19 treated 4.  Pneumatocele and pneumothorax at risk prognosis is poor 5. Metabolic encephalopathy no change   I have personally seen and evaluated the patient, evaluated laboratory and imaging results, formulated the assessment and plan and placed orders. The Patient requires high complexity decision making with multiple systems involvement.  Rounds were done with the Respiratory Therapy Director and Staff therapists and discussed with nursing staff also.  Yevonne Pax, MD [facility] Pulmonary Critical Care Medicine Sleep Medicine

## 2020-07-30 ENCOUNTER — Other Ambulatory Visit (HOSPITAL_COMMUNITY): Payer: Self-pay

## 2020-07-30 NOTE — Progress Notes (Signed)
Pulmonary Critical Care Medicine [facility]   PULMONARY CRITICAL CARE SERVICE  PROGRESS NOTE     Rachel [name]  [id number]:[id number]  DOB: 03/16/1956   DOA: 07/09/2020  Referring Physician: Carron Curie, MD  HPI: Rachel [name] is a 65 y.o. female seen for follow up of Acute on Chronic Respiratory Failure.  Right now is on pressure control mode has been on 65% FiO2 good saturations are noted.  Medications: Reviewed on Rounds  Physical Exam:  Vitals: Temperature is 98.4 pulse 89 respiratory 28 blood pressure is 134/98 saturations 94%  Ventilator Settings on pressure assist control FiO2 65% IP 25 PEEP 5  . General: Comfortable at this time . Eyes: Grossly normal lids, irises & conjunctiva . ENT: grossly tongue is normal . Neck: no obvious mass . Cardiovascular: S1 S2 normal no gallop . Respiratory: Scattered rhonchi expansion is equal . Abdomen: soft . Skin: no rash seen on limited exam . Musculoskeletal: not rigid . Psychiatric:unable to assess . Neurologic: no seizure no involuntary movements         Lab Data:   Basic Metabolic Panel: Recent Labs  Lab 07/24/20 0540 07/25/20 0442 07/26/20 0422 07/28/20 0414  NA 137  --  136 138  K 3.4* 3.8 4.2 4.3  CL 88*  --  84* 85*  CO2 40*  --  41* 42*  GLUCOSE 146*  --  202* 180*  BUN 14  --  15 24*  CREATININE <0.30*  --  0.34* 0.40*  CALCIUM 8.9  --  8.9 8.7*  MG 1.8  --  2.0 2.1  PHOS 3.3  --  3.1 3.8    ABG: No results for input(s): PHART, PCO2ART, PO2ART, HCO3, O2SAT in the last 168 hours.  Liver Function Tests: No results for input(s): AST, ALT, ALKPHOS, BILITOT, PROT, ALBUMIN in the last 168 hours. No results for input(s): LIPASE, AMYLASE in the last 168 hours. No results for input(s): AMMONIA in the last 168 hours.  CBC: Recent Labs  Lab 07/24/20 0540 07/26/20 0422 07/28/20 0414  WBC 10.3 12.3* 9.8  HGB 7.6* 7.8* 7.6*  HCT 25.3* 26.1* 25.5*  MCV 101.6* 101.6* 101.6*  PLT 180 160 214     Cardiac Enzymes: No results for input(s): CKTOTAL, CKMB, CKMBINDEX, TROPONINI in the last 168 hours.  BNP (last 3 results) Recent Labs    05/31/20 1252  BNP 76.0    ProBNP (last 3 results) No results for input(s): PROBNP in the last 8760 hours.  Radiological Exams: CT ABDOMEN PELVIS WO CONTRAST  Result Date: 07/29/2020 CLINICAL DATA:  Anatomy review for gastrostomy tube placement EXAM: CT ABDOMEN AND PELVIS WITHOUT CONTRAST TECHNIQUE: Multidetector CT imaging of the abdomen and pelvis was performed following the standard protocol without IV contrast. COMPARISON:  None. FINDINGS: Lower chest: Large cavitary mass in the right lower lobe again noted, unchanged. Small right pleural effusion. Hepatobiliary: No focal hepatic abnormality. Gallbladder unremarkable. Pancreas: No focal abnormality or ductal dilatation. Spleen: No focal abnormality.  Normal size. Adrenals/Urinary Tract: Bilateral low-density lesions in the kidneys cannot be characterized on this noncontrast study. No hydronephrosis. Adrenal glands and urinary bladder unremarkable. Stomach/Bowel: Sigmoid diverticulosis. No active diverticulitis. Stomach and small bowel decompressed, unremarkable. NG tube present in the stomach. Vascular/Lymphatic: Aortoiliac atherosclerosis. No evidence of aneurysm or adenopathy. Reproductive: Prior hysterectomy.  No adnexal masses. Other: Small amount of free fluid in the pelvis and adjacent to the liver. No free air. Musculoskeletal: No acute bony abnormality. IMPRESSION: Large cavitary lesion in the  right lower lobe. Small right pleural effusion. NG tube present in the stomach. Aortoiliac atherosclerosis. Sigmoid diverticulosis. Small amount of free fluid in the abdomen and pelvis. Electronically Signed   By: Charlett Nose M.D.   On: 07/29/2020 23:23   DG Chest Port 1 View  Result Date: 07/30/2020 CLINICAL DATA:  Pneumonitis seal. EXAM: PORTABLE CHEST 1 VIEW COMPARISON:  Chest x-ray 07/25/2020.   Chest CT 07/08/2020. FINDINGS: Tracheostomy tube, NG tube, PICC line in stable position. Heart size normal. Large right lung cystic changes again suggesting pneumatoceles again noted without interim change. This results in compressive atelectasis of the left lung. Left lung diffuse infiltrate cannot be excluded. No pleural effusion. Heart size stable. No acute bony abnormality. IMPRESSION: 1.  Lines and tubes in stable position. 2. Large right lung cystic changes again noted suggesting pneumatoceles. No interim change. Resultant compressive atelectasis of the left lung. Left lung diffuse infiltrate cannot be excluded. Similar findings noted on prior exam. Electronically Signed   By: Maisie Fus  Register   On: 07/30/2020 05:38   DG Abd Portable 1V  Result Date: 07/29/2020 CLINICAL DATA:  NG tube placement. EXAM: PORTABLE ABDOMEN - 1 VIEW COMPARISON:  07/10/2020 FINDINGS: The NG tube tip is in the antropyloric region of the stomach. The bowel gas pattern is unremarkable. Stable hyperinflation of the right lung IMPRESSION: NG tube tip in the antropyloric region of the stomach. Electronically Signed   By: Rudie Meyer M.D.   On: 07/29/2020 13:18    Assessment/Plan Active Problems:   Acute on chronic respiratory failure with hypoxia (HCC)   COVID-19 virus infection   Pneumonia due to COVID-19 virus   Pneumothorax, left   Metabolic encephalopathy   1. Acute on chronic respiratory failure hypoxia patient is on pressure control mode has been on 65% FiO2 good saturations are noted. 2. COVID-19 virus infection recovery phase 3. Pneumonia due to COVID-19 treated slow improvement 4. Metabolic encephalopathy patient is at baseline 5. Pneumatocele with history of pneumothorax prognosis poor   I have personally seen and evaluated the patient, evaluated laboratory and imaging results, formulated the assessment and plan and placed orders. The Patient requires high complexity decision making with multiple systems  involvement.  Rounds were done with the Respiratory Therapy Director and Staff therapists and discussed with nursing staff also.  Yevonne Pax, MD [facility] Pulmonary Critical Care Medicine Sleep Medicine

## 2020-07-30 NOTE — Consult Note (Addendum)
Chief Complaint: Patient was seen in consultation today for percutaneous gastric tube placement at the request of Dr Laren Everts   Supervising Physician: Mir, Sharen Heck  Patient Status: Select IP  History of Present Illness: Rachel [name] is a 65 y.o. female   Dyspnea URI; headaches and weakness for 1 week-- presented to ED at [facility] + 05/31/20 Requiring intubation Transferred to [facility] from [facility] for vent management ARDS secondary Covid infection Septic shock Trach placed 07/02/20  Transfer[facility] to [facility] for [facility] management 07/09/20 Acute on chronic respiratory failure Unable to tolerate weaning per Dr Humphrey Rolls Pulmonology   DR Humphrey Rolls note 4/18: Had a very lengthy discussion with the patient the husband and the daughter in the room along with case management.  Reviewed the x-rays that were available to [place].  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  In addition also explained to them that she is at risk for acute complications to include pneumothorax as a possibility also.  Plan is to home with ventilator vs SNF Deconditioning Protein calorie Malnutrition Dysphagia Long term care  Request made for percutaneous gastric tube placement Imaging reviewed by Dr Mir and Dr Archie Patten procedure      Past Medical History:  Diagnosis Date  . Acute on chronic respiratory failure with hypoxia ([place])   . COVID-19 virus infection   . GERD (gastroesophageal reflux disease)   . Hypercholesteremia   . Kidney stones   . Metabolic encephalopathy   . Pneumonia due to COVID-19 virus   . Pneumothorax, left   . Wears dentures    full upper, partial  lower    Past Surgical History:  Procedure Laterality Date  . ABDOMINAL HYSTERECTOMY    . BREAST BIOPSY Right   . BREAST EXCISIONAL BIOPSY Right   . COLONOSCOPY    . COLONOSCOPY WITH PROPOFOL N/A 04/22/2015   Procedure: COLONOSCOPY WITH PROPOFOL;  Surgeon: Lucilla Lame, MD;  Location: [place];  Service: Endoscopy;  Laterality: N/A;  . KIDNEY STONE SURGERY      Allergies: Patient has no known allergies.  Medications: Prior to Admission medications   Medication Sig Start Date End Date Taking? Authorizing Provider  acetaminophen (TYLENOL) 160 MG/5ML solution Place 20.3 mLs (650 mg total) into feeding tube every 6 (six) hours as needed for mild pain or fever. 07/09/20   [place], Cristal Ford, NP  bethanechol (URECHOLINE) 10 MG tablet Place 1 tablet (10 mg total) into feeding tube 3 (three) times daily. 07/09/20   [facility]heart, Cristal Ford, NP  Chlorhexidine Gluconate Cloth 2 % PADS Apply 6 each topically daily. 07/09/20   [facility], Cristal Ford, NP  chlorhexidine gluconate, MEDLINE KIT, (PERIDEX) 0.12 % solution 15 mLs by Mouth Rinse route 2 (two) times daily. 07/09/20   [facility], Cristal Ford, NP  cloNIDine (CATAPRES) 0.1 MG tablet Place 1 tablet (0.1 mg total) into feeding tube 3 (three) times daily. 07/09/20   White[facility], Cristal Ford, NP  enoxaparin (LOVENOX) 40 MG/0.4ML injection Inject 0.4 mLs (40 mg total) into the skin daily. 07/10/20   [facility], Cristal Ford, NP  fentaNYL (DURAGESIC) 25 MCG/HR Place 1 patch onto the skin every  3 (three) days. 07/10/20   Whiteheart, Cristal Ford, NP  fiber (NUTRISOURCE FIBER) PACK packet Place 1 packet into feeding tube 2 (two) times daily. 07/09/20   Whiteheart, Cristal Ford, NP  insulin aspart (NOVOLOG) 100 UNIT/ML injection Inject 0-20 Units into the skin every 4 (four) hours. 07/09/20   [facility], Cristal Ford, NP  ipratropium-albuterol (DUONEB) 0.5-2.5 (3) MG/3ML SOLN Take 3 mLs by nebulization every 4 (four) hours as needed. 07/09/20   [facility], Cristal Ford, NP   lidocaine (LIDODERM) 5 % Place 1 patch onto the skin daily. Remove & Discard patch within 12 hours or as directed by MD 07/10/20   Marijean Heath, NP  lip balm (CARMEX) ointment Apply topically as needed for lip care. 07/09/20   [facility], Cristal Ford, NP  melatonin 3 MG TABS tablet Place 1 tablet (3 mg total) into feeding tube at bedtime as needed. 07/09/20   [facility], Cristal Ford, NP  Nutritional Supplements (FEEDING SUPPLEMENT, PROSOURCE TF,) liquid Place 45 mLs into feeding tube 2 (two) times daily. 07/09/20   [facility], Cristal Ford, NP  Nutritional Supplements (FEEDING SUPPLEMENT, VITAL 1.5 CAL,) LIQD Place 1,000 mLs into feeding tube continuous. 07/09/20   [facility], Cristal Ford, NP  ondansetron (ZOFRAN) 4 MG/2ML SOLN injection Inject 2 mLs (4 mg total) into the vein every 6 (six) hours as needed for nausea. 07/09/20   [facility], Cristal Ford, NP  oxyCODONE (OXY IR/ROXICODONE) 5 MG immediate release tablet Place 1 tablet (5 mg total) into feeding tube every 12 (twelve) hours. 07/09/20   [facility], Cristal Ford, NP  oxyCODONE (ROXICODONE) 5 MG/5ML solution Place 5 mLs (5 mg total) into feeding tube every 6 (six) hours as needed for moderate pain. 07/09/20   [facility], Cristal Ford, NP  pantoprazole sodium (PROTONIX) 40 mg/20 mL PACK Place 20 mLs (40 mg total) into feeding tube daily. 07/10/20   [facility], Cristal Ford, NP  simvastatin (ZOCOR) 40 MG tablet Place 1 tablet (40 mg total) into feeding tube daily at 6 PM. 07/09/20   [facility]heart, Cristal Ford, NP  sodium chloride 0.9 % infusion Inject 250 mLs into the vein continuous. 07/09/20   Whiteheart, Cristal Ford, NP  sodium chloride flush (NS) 0.9 % SOLN 10-40 mLs by Intracatheter route as needed (flush). 07/09/20   [facility]heart, Cristal Ford, NP  sodium chloride flush (NS) 0.9 % SOLN 10-40 mLs by Intracatheter route every 12 (twelve) hours. 07/09/20   [facility], Cristal Ford, NP  sodium chloride flush (NS) 0.9 % SOLN 10-40 mLs by Intracatheter route as needed (flush).  07/09/20   [facility], Cristal Ford, NP  Water For Irrigation, Sterile (FREE WATER) SOLN Place 200 mLs into feeding tube every 4 (four) hours. 07/09/20   [facility], Cristal Ford, NP     Family History  Problem Relation Age of Onset  . Breast cancer Paternal Aunt        in 45's    Social History   Socioeconomic History  . Marital status: Married    Spouse name: Not on file  . Number of children: Not on file  . Years of education: Not on file  . Highest education level: Not on file  Occupational History  . Not on file  Tobacco Use  . Smoking status: Former Smoker    Types: Cigarettes  . Smokeless tobacco: Never Used  . Tobacco comment: quit 2005  Substance and Sexual Activity  . Alcohol use: No  . Drug use: Never  . Sexual activity: Not on file  Other Topics Concern  .  Not on file  Social History Narrative  . Not on file   Social Determinants of Health   Financial Resource Strain: Not on file  Food Insecurity: Not on file  Transportation Needs: Not on file  Physical Activity: Not on file  Stress: Not on file  Social Connections: Not on file    Review of Systems: A 12 point ROS discussed and pertinent positives are indicated in the HPI above.  All other systems are negative.   Vital Signs: There were no vitals taken for this visit.  Physical Exam Vitals reviewed.  HENT:     Mouth/Throat:     Mouth: Mucous membranes are moist.  Cardiovascular:     Rate and Rhythm: Normal rate and regular rhythm.  Pulmonary:     Comments: vent Abdominal:     Palpations: Abdomen is soft.     Tenderness: There is no abdominal tenderness.  Musculoskeletal:        General: Normal range of motion.  Skin:    General: Skin is warm.  Neurological:     Mental Status: She is alert.     Comments: She is alert and tries to communicate as well as can On vent  Psychiatric:     Comments: Husband Elenore Rota consented via phone     Imaging: CT ABDOMEN PELVIS WO CONTRAST  Result Date:  07/29/2020 CLINICAL DATA:  Anatomy review for gastrostomy tube placement EXAM: CT ABDOMEN AND PELVIS WITHOUT CONTRAST TECHNIQUE: Multidetector CT imaging of the abdomen and pelvis was performed following the standard protocol without IV contrast. COMPARISON:  None. FINDINGS: Lower chest: Large cavitary mass in the right lower lobe again noted, unchanged. Small right pleural effusion. Hepatobiliary: No focal hepatic abnormality. Gallbladder unremarkable. Pancreas: No focal abnormality or ductal dilatation. Spleen: No focal abnormality.  Normal size. Adrenals/Urinary Tract: Bilateral low-density lesions in the kidneys cannot be characterized on this noncontrast study. No hydronephrosis. Adrenal glands and urinary bladder unremarkable. Stomach/Bowel: Sigmoid diverticulosis. No active diverticulitis. Stomach and small bowel decompressed, unremarkable. NG tube present in the stomach. Vascular/Lymphatic: Aortoiliac atherosclerosis. No evidence of aneurysm or adenopathy. Reproductive: Prior hysterectomy.  No adnexal masses. Other: Small amount of free fluid in the pelvis and adjacent to the liver. No free air. Musculoskeletal: No acute bony abnormality. IMPRESSION: Large cavitary lesion in the right lower lobe. Small right pleural effusion. NG tube present in the stomach. Aortoiliac atherosclerosis. Sigmoid diverticulosis. Small amount of free fluid in the abdomen and pelvis. Electronically Signed   By: Rolm Baptise M.D.   On: 07/29/2020 23:23   DG Chest 1 View  Result Date: 07/19/2020 CLINICAL DATA:  No pneumonia. EXAM: CHEST  1 VIEW COMPARISON:  Radiograph 2 days ago 07/17/2020.  CT 07/08/2020 FINDINGS: Tracheostomy tube at the thoracic inlet. Enteric tube in place with tip below the diaphragm not included in the field of view. Right upper extremity PICC tip in the SVC. Pneumatoceles in the right lung with internal septations and expansion causing leftward mediastinal shift, stable from prior exam. Right midlung  opacity consistent with compressive atelectasis, similar. Generalized increased density throughout the left hemithorax is stable. Small cavities in the left lung on prior CT are not well seen by radiograph. Overall no significant change in the interim. IMPRESSION: 1. No significant change from prior exam. 2. Pneumatoceles in the right lung with internal septations and expansion. Right midlung compressive atelectasis. 3. Diffuse patchy airspace disease in the left lung appears similar. Cavitary lesions on prior CT not well seen by radiograph. Electronically  Signed   By: Keith Rake M.D.   On: 07/19/2020 15:02   CT CHEST WO CONTRAST  Result Date: 07/08/2020 CLINICAL DATA:  Shortness of breath.  Pneumonia due to COVID-19. EXAM: CT CHEST WITHOUT CONTRAST TECHNIQUE: Multidetector CT imaging of the chest was performed following the standard protocol without IV contrast. COMPARISON:  July 06, 2020.  May 31, 2020. FINDINGS: Cardiovascular: Atherosclerosis of thoracic aorta is noted without aneurysm formation. Normal cardiac size. No pericardial effusion. Mediastinum/Nodes: Tracheostomy tube is in grossly good position. Enteric tube is seen passing through esophagus and into stomach. Thyroid gland is unremarkable. No adenopathy is noted. Lungs/Pleura: Continued bilateral lung opacities are noted concerning for multifocal pneumonia. Small right pleural effusion is noted. There is interval development of multiple cystic areas within the lung parenchyma, but most prominently seen in the right lobe. The largest such pneumatocele measures 18 x 14 cm. It is causing mediastinal shift to the left. Multiloculated air collection is noted posteriorly in right lung which contains multiple fluid levels, consistent with complex pneumatocele. Upper Abdomen: No acute abnormality. Musculoskeletal: No chest wall mass or suspicious bone lesions identified. IMPRESSION: 1. Continued bilateral lung opacities are noted concerning  for multifocal pneumonia. 2. There is interval development of multiple cystic areas within the lung parenchyma, but most prominently seen in the right lobe. The largest such pneumatocele measures 18 x 14 cm. It is causing significant mediastinal shift to the left. Multiloculated air collection is noted posteriorly in right lung which contains multiple fluid levels, consistent with complex pneumatocele. 3. Small right pleural effusion is noted. 4. Tracheostomy tube is in grossly good position. 5. Aortic atherosclerosis. Aortic Atherosclerosis (ICD10-I70.0). Electronically Signed   By: Marijo Conception M.D.   On: 07/08/2020 20:03   DG Chest Port 1 View  Result Date: 07/30/2020 CLINICAL DATA:  Pneumonitis seal. EXAM: PORTABLE CHEST 1 VIEW COMPARISON:  Chest x-ray 07/25/2020.  Chest CT 07/08/2020. FINDINGS: Tracheostomy tube, NG tube, PICC line in stable position. Heart size normal. Large right lung cystic changes again suggesting pneumatoceles again noted without interim change. This results in compressive atelectasis of the left lung. Left lung diffuse infiltrate cannot be excluded. No pleural effusion. Heart size stable. No acute bony abnormality. IMPRESSION: 1.  Lines and tubes in stable position. 2. Large right lung cystic changes again noted suggesting pneumatoceles. No interim change. Resultant compressive atelectasis of the left lung. Left lung diffuse infiltrate cannot be excluded. Similar findings noted on prior exam. Electronically Signed   By: Marcello Moores  Register   On: 07/30/2020 05:38   DG CHEST PORT 1 VIEW  Result Date: 07/25/2020 CLINICAL DATA:  Respiratory failure EXAM: PORTABLE CHEST 1 VIEW COMPARISON:  07/22/2020, 07/17/2020, 07/05/2020 FINDINGS: Tracheostomy, nasoenteric feeding tube extending into the abdomen, and right upper extremity PICC line with its tip within the superior vena cava are unchanged. Large right basilar cavitary lesion is again identified and appears stable since immediate  prior examination, resulting in hyperinflation of the right hemithorax, marked mediastinal shift to the left, and marked depression of the right hemidiaphragm. Small right pleural effusion may be present. Focal pulmonary infiltrate within the residual, compressed right upper lobe has essentially resolved. Diffuse pulmonary infiltrate is again seen throughout the left lung which may relate, in part, to vascular shunting to the left lung as well as compression of the left hemithorax. No pneumothorax or pleural effusion on the left. Cardiac size within normal limits. IMPRESSION: Stable support lines and tubes. Unchanged large cavitary lesion within the right  lung base demonstrating marked mass effect upon the mediastinum and right hemidiaphragm. This results in persistent apparent infiltrate within the left lung which likely represents a combination of compressed parenchyma as well as vascular shunting from the heavily diseased right lung. Interval resolution of pulmonary infiltrate within the right upper lobe. Electronically Signed   By: Fidela Salisbury MD   On: 07/25/2020 06:02   DG Chest Port 1 View  Result Date: 07/22/2020 CLINICAL DATA:  65 year old female status post COVID-19 with severe lung disease, large right pneumatocele. EXAM: PORTABLE CHEST 1 VIEW COMPARISON:  Portable chest 07/19/2020 and earlier including chest CT 07/08/2020. FINDINGS: Portable AP semi upright view at 0629 hours. Stable tracheostomy, right PICC line, visible enteric feeding tube. Hyperinflated right lung with large lower lung cavity or pneumatocele, now about 20 cm diameter versus 17 cm late last month. No communicating pneumothorax. Decreased left lung base ventilation from last week and mildly increased leftward mediastinal shift, lower left lung volume. No left-side pneumothorax or pleural effusion is evident. IMPRESSION: 1. Decreasing left lung ventilation appears related to large and increasing right lung pneumatocele (now 20 cm  diameter) with mass effect on the mediastinum. Recommend Thoracic Surgery consultation. 2.  Stable lines and tubes. Electronically Signed   By: Genevie Ann M.D.   On: 07/22/2020 07:11   DG Chest Port 1 View  Result Date: 07/17/2020 CLINICAL DATA:  Pneumonia. EXAM: PORTABLE CHEST 1 VIEW COMPARISON:  09/09/2020. FINDINGS: Tracheostomy tube and feeding tube in stable position. Heart size normal. Prominent cystic changes are noted throughout the mid and right lower lung. Again prominent pneumatoceles could present in this fashion. Associated mediastinal and cardiac shift to the left. No change noted. Atelectatic changes right upper lung again noted. Diffuse infiltrate left lung again. No acute bony abnormality. IMPRESSION: 1.  Tracheostomy tube and feeding tube in stable position. 2. Prominent cystic changes noted throughout the mid and right lower lung again consistent with prominent pneumatoceles. Atelectatic changes right upper lung again noted. Diffuse left lung infiltrate again noted. Chest is unchanged. Electronically Signed   By: Marcello Moores  Register   On: 07/17/2020 05:42   DG CHEST PORT 1 VIEW  Result Date: 07/10/2020 CLINICAL DATA:  Respiratory failure EXAM: PORTABLE CHEST 1 VIEW COMPARISON:  July 09, 2020 chest radiograph and chest CT July 08, 2020 FINDINGS: Tracheostomy catheter tip is 4.3 cm above the carina. Enteric tube tip is below the diaphragm. Central catheter tip is at the cavoatrial junction. No pneumothorax. Multiple pneumatoceles are again noted occupying much of the right mid and lower lung regions. Apparent small right pleural effusion. There is persistent volume loss with ill-defined airspace opacity in the right upper lobe. There is patchy airspace opacity in portions of the left upper and left lower lung regions with small left pleural effusion. Heart size normal. Heart shifted toward the left, likely due to the large pneumatoceles on the right. No bone lesions. IMPRESSION: Tube and catheter  positions as described without pneumothorax. Extensive pneumatocele formation again noted on the right which deviates the heart toward the left. Patchy airspace opacity in the right upper lobe and in areas of the left upper and left lower lobe regions. Small pleural effusion on each side. Stable cardiac silhouette. Electronically Signed   By: Lowella Grip III M.D.   On: 07/10/2020 08:32   DG Chest Port 1 View  Result Date: 07/09/2020 CLINICAL DATA:  Acute respiratory failure EXAM: PORTABLE CHEST 1 VIEW COMPARISON:  07/06/2020 FINDINGS: Tracheostomy, nasoenteric feeding tube extending  into the upper abdomen, are unchanged. Right upper extremity PICC line is in place with its tip within the superior vena cava. Left internal jugular central venous catheter has been removed. Multiple large pneumatoceles essentially replacing the right lung base are again identified within the right lung base demonstrating mass effect with compressive atelectasis of the a right upper lobe and mediastinal shift to the left. Superimposed patchy pulmonary infiltrate is seen throughout the aerated right upper lobe and left lung. No pneumothorax. Small right pleural effusion unchanged. Cardiac size within normal limits. IMPRESSION: Right upper extremity PICC line tip within the superior vena cava. Multiple large pneumatocele is a essentially replacing the right lung base demonstrating similar mass effect upon the aerated right upper lobe and mediastinum. Stable superimposed patchy pulmonary infiltrate diffusely within the aerated right upper lobe and left lung. Stable small right pleural effusion. Electronically Signed   By: Fidela Salisbury MD   On: 07/09/2020 04:26   DG Chest Port 1 View  Result Date: 07/06/2020 CLINICAL DATA:  COVID, acute on chronic respiratory failure with hypoxia EXAM: PORTABLE CHEST 1 VIEW COMPARISON:  Multiple priors, most recently 07/05/2020. CT chest dated 06/25/2020. FINDINGS: Tracheostomy in  satisfactory position. 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 Multifocal pneumonia, unchanged. Superimposed right lower lobe atelectasis is suspected given the expanding pneumatocele. No pleural effusion or pneumothorax. Left IJ venous catheter terminates at the cavoatrial junction. Enteric tube courses into the stomach. IMPRESSION: Continued expansion of post COVID pneumatocele in the right lower chest, now with leftward cardiomediastinal shift. Multifocal pneumonia in this patient with known COVID, unchanged. Tracheostomy in satisfactory position. Additional support apparatus as above. Electronically Signed   By: Julian Hy M.D.   On: 07/06/2020 06:40   DG Chest Port 1 View  Result Date: 07/05/2020 CLINICAL DATA:  Pneumothorax EXAM: PORTABLE CHEST 1 VIEW COMPARISON:  Radiograph 07/03/2020 FINDINGS: A tracheostomy tube remains in the mid trachea. Left IJ catheter tip terminates near the mid SVC. Transesophageal tube tip terminates below the margins of imaging, beyond the GE junction. Diffuse heterogeneous opacities throughout both lungs. There is gradually increasing size of the region of the lucency in the right lung base. Suspect some layering right effusion as well. A pigtail catheter in the left lung base has been slightly retracted. Small volume left pleural fluid is likely present. No visible left pneumothorax. Diffuse body wall edema. Degenerative changes are present in the imaged spine and shoulders. IMPRESSION: 1. Gradually increasing size of the region of lucency in the right lung base, though possibly accentuated by technique and low volumes. 2. Slight retraction of a left basilar pigtail catheter. No visible left pneumothorax. Small bilateral pleural effusions. 3. Diffuse heterogeneous opacities throughout both lungs are otherwise fairly similar prior. Electronically Signed   By: Lovena Le M.D.   On:  07/05/2020 06:27   DG Chest Port 1 View  Result Date: 07/03/2020 CLINICAL DATA:  Hypoxia EXAM: PORTABLE CHEST 1 VIEW COMPARISON:  07/03/2020 FINDINGS: Cardiac shadow is stable. Tracheostomy tube and left-sided jugular central line are again seen and stable. Pigtail catheter in the left base has withdrawn slightly from the prior exam. Persistent opacity is noted primarily in the mid and upper lung field on the right and predominately peripherally on the left. The overall appearance is stable from the prior exam. Persistent cystic lucency is noted over the right base stable from the prior exam. Similar findings are noted in the mid  left lung as well. IMPRESSION: Overall stable appearance of the chest when compared with the previous film from earlier in the same day. No new focal abnormality is noted. Electronically Signed   By: Inez Catalina M.D.   On: 07/03/2020 10:00   DG Chest Port 1 View  Result Date: 07/03/2020 CLINICAL DATA:  Respiratory failure EXAM: PORTABLE CHEST 1 VIEW COMPARISON:  Radiograph 07/02/2020 FINDINGS: Tracheostomy tube remains in the mid trachea. Left IJ catheter in stable position. A left pigtail pleural drain remains in place. Telemetry leads overlie the chest. Visible cardiomediastinal contours are unchanged from prior. Diffuse heterogeneous opacities are again seen throughout both lungs, not significantly altered from most recent comparison. Large cystic lucency in the right lung base is also stable. No visible pneumothorax is evident. Suspect small effusions. No acute osseous or soft tissue abnormality. IMPRESSION: 1. No significant interval change in diffuse heterogeneous opacities throughout both lungs. 2. Stable large cystic lucency in the right lung base. 3. Stable support devices. Electronically Signed   By: Lovena Le M.D.   On: 07/03/2020 05:26   DG Chest Port 1 View  Result Date: 07/02/2020 CLINICAL DATA:  Status post tracheostomy. EXAM: PORTABLE CHEST 1 VIEW COMPARISON:   07/01/2020.  CT 06/25/2020. FINDINGS: Interim removal of endotracheal tube and placement tracheostomy tube. Soft tissue air noted over the neck. Interim removal of NG tube. Left IJ line in stable position. Left chest tube in stable position. Heart size normal. Bilateral multifocal pulmonary infiltrates again noted. Prominent cystic changes in the lungs particularly the right lung base. Similar findings on prior exams. Small bilateral pleural effusions cannot be excluded. No pneumothorax. IMPRESSION: 1. Interim removal of endotracheal tube and placement of tracheostomy tube. Interim removal of NG tube. Left IJ line stable position. Left chest tube in stable position. No pneumothorax. Soft tissue air noted over the neck. 2. Bilateral multifocal pulmonary infiltrates again noted. Prominent cystic changes in the lungs particularly the right lung base. Similar findings on prior exam. Electronically Signed   By: Marcello Moores  Register   On: 07/02/2020 12:31   DG Chest Port 1 View  Result Date: 07/01/2020 CLINICAL DATA:  Respiratory failure EXAM: PORTABLE CHEST 1 VIEW COMPARISON:  Radiograph 06/30/2020 FINDINGS: Right anterior obliquity. Endotracheal tube tip terminates 3.2 cm from the carina. A transesophageal tube tip and side port terminate below the GE junction, beyond the margins of imaging. Left IJ approach central venous catheter tip terminates near the left brachiocephalic-caval confluence accounting for rotation. Left basilar pigtail pleural drain remains in place. Appears to been slightly advanced though this may be related to positioning. Telemetry leads and external support devices overlie the chest. Diffuse heterogeneous opacities throughout both lungs superimposed on more chronic cystic changes in the lungs. Opacities increasingly coalescent in the right mid lung and left lung base though this may be related to diminished lung volumes when compared to prior examination. Cardiomediastinal contours are difficult  to assess Foley given overlying opacity and patient rotation. No gross acute contour abnormality is clearly evident. Degenerative changes are present in the imaged spine and shoulders. No acute osseous or soft tissue abnormality. IMPRESSION: 1. Increasingly coalescent opacities in the right mid lung and left lung base though this may be related to diminished lung volumes accentuating the previously seen airspace disease when compared to prior examination. 2. Stable satisfactory positioning of the support devices. 3. Right anterior obliquity, limiting evaluation of the cardiomediastinal contours. Electronically Signed   By: Lovena Le M.D.   On: 07/01/2020  04:47   DG Abd Portable 1V  Result Date: 07/29/2020 CLINICAL DATA:  NG tube placement. EXAM: PORTABLE ABDOMEN - 1 VIEW COMPARISON:  07/10/2020 FINDINGS: The NG tube tip is in the antropyloric region of the stomach. The bowel gas pattern is unremarkable. Stable hyperinflation of the right lung IMPRESSION: NG tube tip in the antropyloric region of the stomach. Electronically Signed   By: Marijo Sanes M.D.   On: 07/29/2020 13:18   DG Abd Portable 1V  Result Date: 07/10/2020 CLINICAL DATA:  Nasogastric tube placement EXAM: PORTABLE ABDOMEN - 1 VIEW COMPARISON:  None. FINDINGS: Nasoenteric feeding tube tip is seen in the expected distal body of the stomach. The visualized abdominal gas pattern is unremarkable. Small right pleural effusion noted. Large pneumatocele or loculated pneumothorax noted at the right lung base with mediastinal shift to the left, similar to prior chest radiograph. IMPRESSION: Nasoenteric feeding tube tip within the expected distal body of the stomach. Electronically Signed   By: Fidela Salisbury MD   On: 07/10/2020 00:49   [place] EKG SITE RITE  Result Date: 07/06/2020 If Site Rite image not attached, placement could not be confirmed due to current cardiac rhythm.   Labs:  CBC: Recent Labs    07/21/20 0252 07/24/20 0540  07/26/20 0422 07/28/20 0414  WBC 11.6* 10.3 12.3* 9.8  HGB 7.9* 7.6* 7.8* 7.6*  HCT 26.2* 25.3* 26.1* 25.5*  PLT 217 180 160 214    COAGS: Recent Labs    06/15/20 2002 07/02/20 0321  INR 1.1 1.2  APTT 24  --     BMP: Recent Labs    07/21/20 0252 07/24/20 0540 07/25/20 0442 07/26/20 0422 07/28/20 0414  NA 140 137  --  136 138  K 4.5 3.4* 3.8 4.2 4.3  CL 96* 88*  --  84* 85*  CO2 37* 40*  --  41* 42*  GLUCOSE 149* 146*  --  202* 180*  BUN 21 14  --  15 24*  CALCIUM 8.7* 8.9  --  8.9 8.7*  CREATININE 0.33* <0.30*  --  0.34* 0.40*  GFRNONAA >60 NOT CALCULATED  --  >60 >60    LIVER FUNCTION TESTS: Recent Labs    06/23/20 0446 06/27/20 0500 07/09/20 0400 07/10/20 0952  BILITOT 0.7 0.9 0.5 0.6  AST 56* 66* 48* 54*  ALT 237* 96* 58* 54*  ALKPHOS 106 93 75 71  PROT 5.5* 5.8* 6.3* 6.7  ALBUMIN 1.4* 1.5* 2.1* 2.3*    TUMOR MARKERS: No results for input(s): AFPTM, CEA, CA199, CHROMGRNA in the last 8760 hours.  Assessment and Plan:  Covid 19 infection Respiratory failure Required intubation; vent; trach Difficult to wean from vent-- plan to go home or SNF on vent Dysphagia; PCM Deconditioning Need for long term care Scheduled now for percutaneous gastric tube placement Risks and benefits image guided gastrostomy tube placement was discussed with the patient and family via phone including, but not limited to the need for a barium enema during the procedure, bleeding, infection, peritonitis and/or damage to adjacent structures.  All questions were answered, patients husband Elenore Rota is agreeable to proceed.  Consent signed and in chart.   Thank you for this interesting consult.  I greatly enjoyed meeting Jiana Brummitt and look forward to participating in their care.  A copy of this report was sent to the requesting provider on this date.  Electronically Signed: Lavonia Drafts, PA-C 07/30/2020, 9:47 AM   I spent a total of 40 Minutes  in face to face in  clinical consultation, greater than 50% of which was counseling/coordinating care for percutaneous gastric tube placement

## 2020-07-31 ENCOUNTER — Other Ambulatory Visit (HOSPITAL_COMMUNITY): Payer: Self-pay

## 2020-07-31 HISTORY — PX: IR GASTROSTOMY TUBE MOD SED: IMG625

## 2020-07-31 LAB — PROTIME-INR
INR: 1 (ref 0.8–1.2)
Prothrombin Time: 13.6 seconds (ref 11.4–15.2)

## 2020-07-31 MED ORDER — MIDAZOLAM HCL 2 MG/2ML IJ SOLN
INTRAMUSCULAR | Status: AC | PRN
  Administered 2020-07-31 (×2): 0.5 mg via INTRAVENOUS
  Administered 2020-07-31 (×2): 1 mg via INTRAVENOUS

## 2020-07-31 MED ORDER — MIDAZOLAM HCL 2 MG/2ML IJ SOLN
INTRAMUSCULAR | Status: AC
  Filled 2020-07-31: qty 4

## 2020-07-31 MED ORDER — IOHEXOL 300 MG/ML  SOLN
50.0000 mL | Freq: Once | INTRAMUSCULAR | Status: AC | PRN
  Administered 2020-07-31: 10 mL

## 2020-07-31 MED ORDER — FENTANYL CITRATE (PF) 100 MCG/2ML IJ SOLN
INTRAMUSCULAR | Status: AC
  Filled 2020-07-31: qty 2

## 2020-07-31 MED ORDER — GLUCAGON HCL RDNA (DIAGNOSTIC) 1 MG IJ SOLR
INTRAMUSCULAR | Status: AC
  Filled 2020-07-31: qty 1

## 2020-07-31 MED ORDER — LIDOCAINE HCL (PF) 1 % IJ SOLN
INTRAMUSCULAR | Status: AC
  Filled 2020-07-31: qty 30

## 2020-07-31 MED ORDER — FENTANYL CITRATE (PF) 100 MCG/2ML IJ SOLN
INTRAMUSCULAR | Status: AC | PRN
  Administered 2020-07-31: 25 ug via INTRAVENOUS
  Administered 2020-07-31: 50 ug via INTRAVENOUS
  Administered 2020-07-31: 25 ug via INTRAVENOUS

## 2020-07-31 MED ORDER — GLUCAGON HCL RDNA (DIAGNOSTIC) 1 MG IJ SOLR
INTRAMUSCULAR | Status: AC | PRN
  Administered 2020-07-31: 1 mg via INTRAVENOUS

## 2020-07-31 NOTE — Progress Notes (Signed)
Pulmonary Critical Care Medicine [facility]   PULMONARY CRITICAL CARE SERVICE  PROGRESS NOTE     Rachel [name]  [id number]:[id number]  DOB: Dec 26, 1955   DOA: 07/09/2020  Referring Physician: Carron Curie, MD  HPI: Rachel [name] is a 65 y.o. female seen for follow up of Acute on Chronic Respiratory Failure.  Patient is on pressure control mode has been on 60% FiO2.  She is supposed to have a PEG tube placed  Medications: Reviewed on Rounds  Physical Exam:  Vitals: Temperature 98.1 pulse 85 respiratory rate is 20 blood pressure is 165/93 saturations 99%  Ventilator Settings on pressure assist control FiO2 of 60% IP 14 PEEP 5  . General: Comfortable at this time . Eyes: Grossly normal lids, irises & conjunctiva . ENT: grossly tongue is normal . Neck: no obvious mass . Cardiovascular: S1 S2 normal no gallop . Respiratory: Scattered rhonchi expansion is equal . Abdomen: soft . Skin: no rash seen on limited exam . Musculoskeletal: not rigid . Psychiatric:unable to assess . Neurologic: no seizure no involuntary movements         Lab Data:   Basic Metabolic Panel: Recent Labs  Lab 07/25/20 0442 07/26/20 0422 07/28/20 0414  NA  --  136 138  K 3.8 4.2 4.3  CL  --  84* 85*  CO2  --  41* 42*  GLUCOSE  --  202* 180*  BUN  --  15 24*  CREATININE  --  0.34* 0.40*  CALCIUM  --  8.9 8.7*  MG  --  2.0 2.1  PHOS  --  3.1 3.8    ABG: No results for input(s): PHART, PCO2ART, PO2ART, HCO3, O2SAT in the last 168 hours.  Liver Function Tests: No results for input(s): AST, ALT, ALKPHOS, BILITOT, PROT, ALBUMIN in the last 168 hours. No results for input(s): LIPASE, AMYLASE in the last 168 hours. No results for input(s): AMMONIA in the last 168 hours.  CBC: Recent Labs  Lab 07/26/20 0422 07/28/20 0414  WBC 12.3* 9.8  HGB 7.8* 7.6*  HCT 26.1* 25.5*  MCV 101.6* 101.6*  PLT 160 214    Cardiac Enzymes: No results for input(s): CKTOTAL, CKMB, CKMBINDEX,  TROPONINI in the last 168 hours.  BNP (last 3 results) Recent Labs    05/31/20 1252  BNP 76.0    ProBNP (last 3 results) No results for input(s): PROBNP in the last 8760 hours.  Radiological Exams: CT ABDOMEN PELVIS WO CONTRAST  Result Date: 07/29/2020 CLINICAL DATA:  Anatomy review for gastrostomy tube placement EXAM: CT ABDOMEN AND PELVIS WITHOUT CONTRAST TECHNIQUE: Multidetector CT imaging of the abdomen and pelvis was performed following the standard protocol without IV contrast. COMPARISON:  None. FINDINGS: Lower chest: Large cavitary mass in the right lower lobe again noted, unchanged. Small right pleural effusion. Hepatobiliary: No focal hepatic abnormality. Gallbladder unremarkable. Pancreas: No focal abnormality or ductal dilatation. Spleen: No focal abnormality.  Normal size. Adrenals/Urinary Tract: Bilateral low-density lesions in the kidneys cannot be characterized on this noncontrast study. No hydronephrosis. Adrenal glands and urinary bladder unremarkable. Stomach/Bowel: Sigmoid diverticulosis. No active diverticulitis. Stomach and small bowel decompressed, unremarkable. NG tube present in the stomach. Vascular/Lymphatic: Aortoiliac atherosclerosis. No evidence of aneurysm or adenopathy. Reproductive: Prior hysterectomy.  No adnexal masses. Other: Small amount of free fluid in the pelvis and adjacent to the liver. No free air. Musculoskeletal: No acute bony abnormality. IMPRESSION: Large cavitary lesion in the right lower lobe. Small right pleural effusion. NG tube present in the  stomach. Aortoiliac atherosclerosis. Sigmoid diverticulosis. Small amount of free fluid in the abdomen and pelvis. Electronically Signed   By: Charlett Nose M.D.   On: 07/29/2020 23:23   DG Chest Port 1 View  Result Date: 07/30/2020 CLINICAL DATA:  Pneumonitis seal. EXAM: PORTABLE CHEST 1 VIEW COMPARISON:  Chest x-ray 07/25/2020.  Chest CT 07/08/2020. FINDINGS: Tracheostomy tube, NG tube, PICC line in stable  position. Heart size normal. Large right lung cystic changes again suggesting pneumatoceles again noted without interim change. This results in compressive atelectasis of the left lung. Left lung diffuse infiltrate cannot be excluded. No pleural effusion. Heart size stable. No acute bony abnormality. IMPRESSION: 1.  Lines and tubes in stable position. 2. Large right lung cystic changes again noted suggesting pneumatoceles. No interim change. Resultant compressive atelectasis of the left lung. Left lung diffuse infiltrate cannot be excluded. Similar findings noted on prior exam. Electronically Signed   By: Maisie Fus  Register   On: 07/30/2020 05:38    Assessment/Plan Active Problems:   Acute on chronic respiratory failure with hypoxia (HCC)   COVID-19 virus infection   Pneumonia due to COVID-19 virus   Pneumothorax, left   Metabolic encephalopathy   1. Acute on chronic respiratory failure hypoxia the family still wants to take her home because of her underlying pulmonary status and condition its not a safe discharge in my opinion however family wants to take the patient home.  She is right now requiring the 60% FiO2 and on full support on the ventilator. 2. COVID-19 virus infection recovery 3. Pneumatocele with pneumothorax seems to be stable on the x-ray 4. Pneumonia due to COVID-19 has been treated 5. Metabolic encephalopathy no change we will continue to follow along.   I have personally seen and evaluated the patient, evaluated laboratory and imaging results, formulated the assessment and plan and placed orders. The Patient requires high complexity decision making with multiple systems involvement.  Rounds were done with the Respiratory Therapy Director and Staff therapists and discussed with nursing staff also.  Yevonne Pax, MD [facility] Pulmonary Critical Care Medicine Sleep Medicine

## 2020-07-31 NOTE — Procedures (Signed)
Interventional Radiology Procedure Note  Procedure: 20 fr G tube placed  Indication: Dysphagia  Findings: Please refer to procedural dictation for full description.  Complications: None  EBL: < 10 mL  Rachel Belling, MD [phone]

## 2020-08-01 LAB — CBC
HCT: 26 % — ABNORMAL LOW (ref 36.0–46.0)
Hemoglobin: 7.7 g/dL — ABNORMAL LOW (ref 12.0–15.0)
MCH: 29.7 pg (ref 26.0–34.0)
MCHC: 29.6 g/dL — ABNORMAL LOW (ref 30.0–36.0)
MCV: 100.4 fL — ABNORMAL HIGH (ref 80.0–100.0)
Platelets: 206 10*3/uL (ref 150–400)
RBC: 2.59 MIL/uL — ABNORMAL LOW (ref 3.87–5.11)
RDW: 18.5 % — ABNORMAL HIGH (ref 11.5–15.5)
WBC: 9.3 10*3/uL (ref 4.0–10.5)
nRBC: 0 % (ref 0.0–0.2)

## 2020-08-01 LAB — BASIC METABOLIC PANEL
Anion gap: 9 (ref 5–15)
BUN: 17 mg/dL (ref 8–23)
CO2: 46 mmol/L — ABNORMAL HIGH (ref 22–32)
Calcium: 8.7 mg/dL — ABNORMAL LOW (ref 8.9–10.3)
Chloride: 82 mmol/L — ABNORMAL LOW (ref 98–111)
Creatinine, Ser: <0.3 mg/dL — ABNORMAL LOW (ref 0.44–1.00)
Glucose, Bld: 149 mg/dL — ABNORMAL HIGH (ref 70–99)
Potassium: 3.7 mmol/L (ref 3.5–5.1)
Sodium: 137 mmol/L (ref 135–145)

## 2020-08-01 LAB — MAGNESIUM: Magnesium: 2 mg/dL (ref 1.7–2.4)

## 2020-08-01 NOTE — Progress Notes (Signed)
Pulmonary Critical Care Medicine [facility]   PULMONARY CRITICAL CARE SERVICE  PROGRESS NOTE     Rachel [name]  [id number]  DOB: [birth year]   DOA: 07/09/2020  Referring Physician: Carron Curie, MD  HPI: Rachel [name] is a 65 y.o. female seen for follow up of Acute on Chronic Respiratory Failure.  Patient is afebrile right now resting comfortably.  Has apparently had a PEG tube done yesterday not able to be discharged with the higher oxygen levels at 60%  Medications: Reviewed on Rounds  Physical Exam:  Vitals: Temperature is 96.9 pulse 89 respiratory rate is 18 blood pressure 135/77 saturations 100%  Ventilator Settings on pressure assist control FiO2 60% IP 25 PEEP 5  . General: Comfortable at this time . Eyes: Grossly normal lids, irises & conjunctiva . ENT: grossly tongue is normal . Neck: no obvious mass . Cardiovascular: S1 S2 normal no gallop . Respiratory: No rhonchi no rales are noted at this time . Abdomen: soft . Skin: no rash seen on limited exam . Musculoskeletal: not rigid . Psychiatric:unable to assess . Neurologic: no seizure no involuntary movements         Lab Data:   Basic Metabolic Panel: Recent Labs  Lab 07/26/20 0422 07/28/20 0414 08/01/20 0438  NA 136 138 137  K 4.2 4.3 3.7  CL 84* 85* 82*  CO2 41* 42* 46*  GLUCOSE 202* 180* 149*  BUN 15 24* 17  CREATININE 0.34* 0.40* <0.30*  CALCIUM 8.9 8.7* 8.7*  MG 2.0 2.1 2.0  PHOS 3.1 3.8  --     ABG: No results for input(s): PHART, PCO2ART, PO2ART, HCO3, O2SAT in the last 168 hours.  Liver Function Tests: No results for input(s): AST, ALT, ALKPHOS, BILITOT, PROT, ALBUMIN in the last 168 hours. No results for input(s): LIPASE, AMYLASE in the last 168 hours. No results for input(s): AMMONIA in the last 168 hours.  CBC: Recent Labs  Lab 07/26/20 0422 07/28/20 0414 08/01/20 0438  WBC 12.3* 9.8 9.3  HGB 7.8* 7.6* 7.7*  HCT 26.1* 25.5* 26.0*  MCV 101.6* 101.6*  100.4*  PLT 160 214 206    Cardiac Enzymes: No results for input(s): CKTOTAL, CKMB, CKMBINDEX, TROPONINI in the last 168 hours.  BNP (last 3 results) Recent Labs    05/31/20 1252  BNP 76.0    ProBNP (last 3 results) No results for input(s): PROBNP in the last 8760 hours.  Radiological Exams: IR GASTROSTOMY TUBE MOD SED  Result Date: 07/31/2020 INDICATION: 65 year old woman with respiratory failure and dysphagia presents to interventional radiology for gastrostomy tube placement EXAM: Percutaneous gastrostomy tube placement MEDICATIONS: Ancef 2 g IV; Antibiotics were administered within 1 hour of the procedure. Glucagon 1 mg IV ANESTHESIA/SEDATION: Versed 3 mg IV; Fentanyl 100 mcg IV Moderate Sedation Time:  22 minutes The patient was continuously monitored during the procedure by the interventional radiology nurse under my direct supervision. CONTRAST:  5 mL of Omnipaque 350-administered into the gastric lumen. FLUOROSCOPY TIME:  Fluoroscopy Time: 3 minutes 30 seconds (17 mGy). COMPLICATIONS: None immediate. PROCEDURE: Informed written consent was obtained from the patient's husband after a thorough discussion of the procedural risks, benefits and alternatives. All questions were addressed. Maximal Sterile Barrier Technique was utilized including caps, mask, sterile gowns, sterile gloves, sterile drape, hand hygiene and skin antiseptic. A timeout was performed prior to the initiation of the procedure. An orogastric tube was placed with fluoroscopic guidance. The anterior abdomen was prepped and draped in sterile fashion. Ultrasound evaluation  of the left upper quadrant was performed to confirm the position of the liver. The skin and subcutaneous tissues were anesthetized with 1% lidocaine. 17 gauge needle was directed into the distended stomach with fluoroscopic guidance. A wire was advanced into the stomach. 9-French vascular sheath was placed and the orogastric tube was snared using a Gooseneck  snare device. The orogastric tube and snare were pulled out of the patient's mouth. The snare device was connected to a 20-French gastrostomy tube. The snare device and gastrostomy tube were pulled through the patient's mouth and out the anterior abdominal wall. The gastrostomy tube was cut to an appropriate length. Contrast injection through gastrostomy tube confirmed placement within the stomach. Fluoroscopic images were obtained for documentation. The gastrostomy tube was flushed with normal saline. IMPRESSION: 20 French percutaneous gastrostomy tube placed utilizing fluoroscopic guidance. Electronically Signed   By: Acquanetta Belling M.D.   On: 07/31/2020 15:02    Assessment/Plan Active Problems:   Acute on chronic respiratory failure with hypoxia (HCC)   COVID-19 virus infection   Pneumonia due to COVID-19 virus   Pneumothorax, left   Metabolic encephalopathy   1. Acute on chronic respiratory failure hypoxia we will continue with pressure control mode at this time we will continue secretion management supportive care patient's overall prognosis for further weaning is quite poor 2. COVID-19 virus infection recovery phase 3. Pneumonia due to COVID-19 treated slow improvement 4. Pneumothorax with severe pneumatocele supportive care prognosis poor 5. Metabolic encephalopathy no change   I have personally seen and evaluated the patient, evaluated laboratory and imaging results, formulated the assessment and plan and placed orders. The Patient requires high complexity decision making with multiple systems involvement.  Rounds were done with the Respiratory Therapy Director and Staff therapists and discussed with nursing staff also.  Yevonne Pax, MD [facility] Pulmonary Critical Care Medicine Sleep Medicine

## 2020-08-01 NOTE — Progress Notes (Addendum)
Rachel [name] is a  65 y.o. female s/p image guided gastrostomy tube placement with Dr. Bryn Gulling on 07/31/20.   G tube site appears clean, dry, no bleeding, no sign of acute infection.  Bowel sound presents all four quadrants.  Patient denies abdominal pain, nausea, vomiting.  Ok to use the gastrostomy tube.   Please call IR for questions and concerns regarding the G tube.    Rachel Bologna Tagen Brethauer PA-C 08/01/2020 9:34 AM

## 2020-08-02 NOTE — Progress Notes (Signed)
Pulmonary Critical Care Medicine [facility]   PULMONARY CRITICAL CARE SERVICE  PROGRESS NOTE     Rachel [name]  [id number]  DOB: [birth year]   DOA: 07/09/2020  Referring Physician: Carron Curie, MD  HPI: Rachel [name] is a 65 y.o. female seen for follow up of Acute on Chronic Respiratory Failure.  Patient is on the ventilator on full support on pressure control mode right now on 55% FiO2  Medications: Reviewed on Rounds  Physical Exam:  Vitals: Temperature is 97.6 pulse 107 respiratory rate is 25 blood pressure 100/85 saturations 98%  Ventilator Settings on pressure assist control FiO2 is 55% IP 25 PEEP 5  . General: Comfortable at this time . Eyes: Grossly normal lids, irises & conjunctiva . ENT: grossly tongue is normal . Neck: no obvious mass . Cardiovascular: S1 S2 normal no gallop . Respiratory: Scattered rhonchi expansion is equal . Abdomen: soft . Skin: no rash seen on limited exam . Musculoskeletal: not rigid . Psychiatric:unable to assess . Neurologic: no seizure no involuntary movements         Lab Data:   Basic Metabolic Panel: Recent Labs  Lab 07/28/20 0414 08/01/20 0438  NA 138 137  K 4.3 3.7  CL 85* 82*  CO2 42* 46*  GLUCOSE 180* 149*  BUN 24* 17  CREATININE 0.40* <0.30*  CALCIUM 8.7* 8.7*  MG 2.1 2.0  PHOS 3.8  --     ABG: No results for input(s): PHART, PCO2ART, PO2ART, HCO3, O2SAT in the last 168 hours.  Liver Function Tests: No results for input(s): AST, ALT, ALKPHOS, BILITOT, PROT, ALBUMIN in the last 168 hours. No results for input(s): LIPASE, AMYLASE in the last 168 hours. No results for input(s): AMMONIA in the last 168 hours.  CBC: Recent Labs  Lab 07/28/20 0414 08/01/20 0438  WBC 9.8 9.3  HGB 7.6* 7.7*  HCT 25.5* 26.0*  MCV 101.6* 100.4*  PLT 214 206    Cardiac Enzymes: No results for input(s): CKTOTAL, CKMB, CKMBINDEX, TROPONINI in the last 168 hours.  BNP (last 3 results) Recent Labs     05/31/20 1252  BNP 76.0    ProBNP (last 3 results) No results for input(s): PROBNP in the last 8760 hours.  Radiological Exams: No results found.  Assessment/Plan Active Problems:   Acute on chronic respiratory failure with hypoxia (HCC)   COVID-19 virus infection   Pneumonia due to COVID-19 virus   Pneumothorax, left   Metabolic encephalopathy   1. Acute on chronic respiratory failure with hypoxia we will continue with full support on the ventilator on pressure control mode titrate oxygen as tolerated continue pulmonary toilet 2. COVID-19 virus infection in recovery phase 3. Pneumonia due to COVID-19 treated we will continue to follow. 4. Pneumothorax has resolved but patient has a large pneumatocele 5. Metabolic encephalopathy she is at her baseline now   I have personally seen and evaluated the patient, evaluated laboratory and imaging results, formulated the assessment and plan and placed orders. The Patient requires high complexity decision making with multiple systems involvement.  Rounds were done with the Respiratory Therapy Director and Staff therapists and discussed with nursing staff also.  Yevonne Pax, MD [facility] Pulmonary Critical Care Medicine Sleep Medicine

## 2020-08-02 NOTE — Progress Notes (Incomplete)
Pulmonary Critical Care Medicine [facility]   PULMONARY CRITICAL CARE SERVICE  PROGRESS NOTE     Rachel [name]  [id number]  DOB: 05/22/1955   DOA: 07/09/2020  Referring Physician: Carron Curie, MD  HPI: [name] is a 65 y.o. female seen for follow up of Acute on Chronic Respiratory Failure. ***  Medications: Reviewed on Rounds  Physical Exam:  Vitals: ***  Ventilator Settings ***  . General: Comfortable at this time . Eyes: Grossly normal lids, irises & conjunctiva . ENT: grossly tongue is normal . Neck: no obvious mass . Cardiovascular: S1 S2 normal no gallop . Respiratory: *** . Abdomen: soft . Skin: no rash seen on limited exam . Musculoskeletal: not rigid . Psychiatric:unable to assess . Neurologic: no seizure no involuntary movements         Lab Data:   Basic Metabolic Panel: Recent Labs  Lab 07/28/20 0414 08/01/20 0438  NA 138 137  K 4.3 3.7  CL 85* 82*  CO2 42* 46*  GLUCOSE 180* 149*  BUN 24* 17  CREATININE 0.40* <0.30*  CALCIUM 8.7* 8.7*  MG 2.1 2.0  PHOS 3.8  --     ABG: No results for input(s): PHART, PCO2ART, PO2ART, HCO3, O2SAT in the last 168 hours.  Liver Function Tests: No results for input(s): AST, ALT, ALKPHOS, BILITOT, PROT, ALBUMIN in the last 168 hours. No results for input(s): LIPASE, AMYLASE in the last 168 hours. No results for input(s): AMMONIA in the last 168 hours.  CBC: Recent Labs  Lab 07/28/20 0414 08/01/20 0438  WBC 9.8 9.3  HGB 7.6* 7.7*  HCT 25.5* 26.0*  MCV 101.6* 100.4*  PLT 214 206    Cardiac Enzymes: No results for input(s): CKTOTAL, CKMB, CKMBINDEX, TROPONINI in the last 168 hours.  BNP (last 3 results) Recent Labs    05/31/20 1252  BNP 76.0    ProBNP (last 3 results) No results for input(s): PROBNP in the last 8760 hours.  Radiological Exams: No results found.  Assessment/Plan Active Problems:   Acute on chronic respiratory failure with hypoxia (HCC)    COVID-19 virus infection   Pneumonia due to COVID-19 virus   Pneumothorax, left   Metabolic encephalopathy   1. ***   I have personally seen and evaluated the patient, evaluated laboratory and imaging results, formulated the assessment and plan and placed orders. The Patient requires high complexity decision making with multiple systems involvement.  Rounds were done with the Respiratory Therapy Director and Staff therapists and discussed with nursing staff also.  Yevonne Pax, MD [facility] Pulmonary Critical Care Medicine Sleep Medicine

## 2020-08-03 NOTE — Progress Notes (Signed)
Pulmonary Critical Care Medicine [facility]   PULMONARY CRITICAL CARE SERVICE  PROGRESS NOTE     Rachel [name]  GYJ:[id number]  DOB: 1955-10-24   DOA: 07/09/2020  Referring Physician: Carron Curie, MD  HPI: Rachel [name] is a 65 y.o. female seen for follow up of Acute on Chronic Respiratory Failure.  Patient currently is on pressure control mode has been on 60% FiO2 with good saturations  Medications: Reviewed on Rounds  Physical Exam:  Vitals: Temperature is 98.0 pulse 76 respiratory rate is 28 blood pressure 155/94 saturations 99%  Ventilator Settings remains on the ventilator.  Now pressure assist control FiO2 60% IP 25 PEEP 5  . General: Comfortable at this time . Eyes: Grossly normal lids, irises & conjunctiva . ENT: grossly tongue is normal . Neck: no obvious mass . Cardiovascular: S1 S2 normal no gallop . Respiratory: No rhonchi no rales noted at this time . Abdomen: soft . Skin: no rash seen on limited exam . Musculoskeletal: not rigid . Psychiatric:unable to assess . Neurologic: no seizure no involuntary movements         Lab Data:   Basic Metabolic Panel: Recent Labs  Lab 07/28/20 0414 08/01/20 0438  NA 138 137  K 4.3 3.7  CL 85* 82*  CO2 42* 46*  GLUCOSE 180* 149*  BUN 24* 17  CREATININE 0.40* <0.30*  CALCIUM 8.7* 8.7*  MG 2.1 2.0  PHOS 3.8  --     ABG: No results for input(s): PHART, PCO2ART, PO2ART, HCO3, O2SAT in the last 168 hours.  Liver Function Tests: No results for input(s): AST, ALT, ALKPHOS, BILITOT, PROT, ALBUMIN in the last 168 hours. No results for input(s): LIPASE, AMYLASE in the last 168 hours. No results for input(s): AMMONIA in the last 168 hours.  CBC: Recent Labs  Lab 07/28/20 0414 08/01/20 0438  WBC 9.8 9.3  HGB 7.6* 7.7*  HCT 25.5* 26.0*  MCV 101.6* 100.4*  PLT 214 206    Cardiac Enzymes: No results for input(s): CKTOTAL, CKMB, CKMBINDEX, TROPONINI in the last 168 hours.  BNP (last 3  results) Recent Labs    05/31/20 1252  BNP 76.0    ProBNP (last 3 results) No results for input(s): PROBNP in the last 8760 hours.  Radiological Exams: No results found.  Assessment/Plan Active Problems:   Acute on chronic respiratory failure with hypoxia (HCC)   COVID-19 virus infection   Pneumonia due to COVID-19 virus   Pneumothorax, left   Metabolic encephalopathy   1. Acute on chronic respiratory failure hypoxia plan is to continue with pressure control mode patient is on 60% FiO2. 2. COVID-19 virus infection recovery phase we will continue to monitor 3. Pneumonia due to COVID-19 treated slowly improving 4. Pneumothorax with severe pneumatocele supportive care prognosis poor 5. Metabolic encephalopathy no chest   I have personally seen and evaluated the patient, evaluated laboratory and imaging results, formulated the assessment and plan and placed orders. The Patient requires high complexity decision making with multiple systems involvement.  Rounds were done with the Respiratory Therapy Director and Staff therapists and discussed with nursing staff also.  Yevonne Pax, MD [facility] Pulmonary Critical Care Medicine Sleep Medicine

## 2020-08-05 ENCOUNTER — Other Ambulatory Visit (HOSPITAL_COMMUNITY): Payer: Self-pay

## 2020-08-05 LAB — CBC
HCT: 29.5 % — ABNORMAL LOW (ref 36.0–46.0)
Hemoglobin: 8.8 g/dL — ABNORMAL LOW (ref 12.0–15.0)
MCH: 29.8 pg (ref 26.0–34.0)
MCHC: 29.8 g/dL — ABNORMAL LOW (ref 30.0–36.0)
MCV: 100 fL (ref 80.0–100.0)
Platelets: 314 10*3/uL (ref 150–400)
RBC: 2.95 MIL/uL — ABNORMAL LOW (ref 3.87–5.11)
RDW: 18 % — ABNORMAL HIGH (ref 11.5–15.5)
WBC: 23.3 10*3/uL — ABNORMAL HIGH (ref 4.0–10.5)
nRBC: 0.1 % (ref 0.0–0.2)

## 2020-08-05 LAB — BASIC METABOLIC PANEL
Anion gap: 8 (ref 5–15)
BUN: 22 mg/dL (ref 8–23)
CO2: 50 mmol/L — ABNORMAL HIGH (ref 22–32)
Calcium: 9.1 mg/dL (ref 8.9–10.3)
Chloride: 77 mmol/L — ABNORMAL LOW (ref 98–111)
Creatinine, Ser: 0.35 mg/dL — ABNORMAL LOW (ref 0.44–1.00)
GFR, Estimated: >60 mL/min (ref >60)
Glucose, Bld: 186 mg/dL — ABNORMAL HIGH (ref 70–99)
Potassium: 3.7 mmol/L (ref 3.5–5.1)
Sodium: 135 mmol/L (ref 135–145)

## 2020-08-05 LAB — MAGNESIUM: Magnesium: 2.1 mg/dL (ref 1.7–2.4)

## 2020-08-05 NOTE — Progress Notes (Signed)
Pulmonary Critical Care Medicine [facility]   PULMONARY CRITICAL CARE SERVICE  PROGRESS NOTE     Rachel [name]  [id number]  DOB: 10/19/1955   DOA: 07/09/2020  Referring Physician: Carron Curie, MD  HPI: Rachel [name] is a 65 y.o. female seen for follow up of Acute on Chronic Respiratory Failure.  Patient currently is on pressure control mode has been on 60% FiO2 still awaiting discharge planning to have everything set up prognosis quite guarded  Medications: Reviewed on Rounds  Physical Exam:  Vitals: Temperature 97.8 pulse 98 respiratory rate 25 blood pressure 171/95  Ventilator Settings on pressure assist control FiO2 60% IP 25 PEEP 5  . General: Comfortable at this time . Eyes: Grossly normal lids, irises & conjunctiva . ENT: grossly tongue is normal . Neck: no obvious mass . Cardiovascular: S1 S2 normal no gallop . Respiratory: No rhonchi no rales are noted at this time . Abdomen: soft . Skin: no rash seen on limited exam . Musculoskeletal: not rigid . Psychiatric:unable to assess . Neurologic: no seizure no involuntary movements         Lab Data:   Basic Metabolic Panel: Recent Labs  Lab 08/01/20 0438 08/05/20 0755  NA 137 135  K 3.7 3.7  CL 82* 77*  CO2 46* 50*  GLUCOSE 149* 186*  BUN 17 22  CREATININE <0.30* 0.35*  CALCIUM 8.7* 9.1  MG 2.0 2.1    ABG: No results for input(s): PHART, PCO2ART, PO2ART, HCO3, O2SAT in the last 168 hours.  Liver Function Tests: No results for input(s): AST, ALT, ALKPHOS, BILITOT, PROT, ALBUMIN in the last 168 hours. No results for input(s): LIPASE, AMYLASE in the last 168 hours. No results for input(s): AMMONIA in the last 168 hours.  CBC: Recent Labs  Lab 08/01/20 0438 08/05/20 0755  WBC 9.3 23.3*  HGB 7.7* 8.8*  HCT 26.0* 29.5*  MCV 100.4* 100.0  PLT 206 314    Cardiac Enzymes: No results for input(s): CKTOTAL, CKMB, CKMBINDEX, TROPONINI in the last 168 hours.  BNP (last 3  results) Recent Labs    05/31/20 1252  BNP 76.0    ProBNP (last 3 results) No results for input(s): PROBNP in the last 8760 hours.  Radiological Exams: DG CHEST PORT 1 VIEW  Result Date: 08/05/2020 CLINICAL DATA:  65 year old female status post COVID-19 with severe lung disease, large right pneumatocele. Tracheostomy. EXAM: PORTABLE CHEST 1 VIEW COMPARISON:  07/30/2020 and earlier. FINDINGS: Portable AP semi upright view at 0640 hours. Stable tracheostomy. Enteric tube removed since 07/30/2020. Large right pneumatocele/loculated pneumothorax with continued mass effect in the chest including leftward shift of the mediastinum. Mild progression compared to 07/22/2020. Left lung ventilation not significantly changed since 07/30/2020. No new pulmonary abnormality. Stable visualized osseous structures. IMPRESSION: 1. Enteric tube removed since 07/30/2020.  Stable tracheostomy. 2. Large right pneumatocele appears to be slowly enlarging with mass effect in the chest including leftward shift of the mediastinum. Stable left lung ventilation since 07/30/2020. Electronically Signed   By: Odessa Fleming M.D.   On: 08/05/2020 07:30    Assessment/Plan Active Problems:   Acute on chronic respiratory failure with hypoxia (HCC)   COVID-19 virus infection   Pneumonia due to COVID-19 virus   Pneumothorax, left   Metabolic encephalopathy   1. Acute on chronic respiratory failure hypoxia we will continue with full support on the ventilator overall prognosis quite guarded for being able to be weaned 2. COVID-19 virus infection in recovery we will continue to  monitor 3. Pneumonia due to COVID-19 has been treated 4. Pneumothorax with resultant pneumatocele no change 5. Metabolic encephalopathy overall no change   I have personally seen and evaluated the patient, evaluated laboratory and imaging results, formulated the assessment and plan and placed orders. The Patient requires high complexity decision making with  multiple systems involvement.  Rounds were done with the Respiratory Therapy Director and Staff therapists and discussed with nursing staff also.  Yevonne Pax, MD [facility] Pulmonary Critical Care Medicine Sleep Medicine

## 2020-08-06 NOTE — Progress Notes (Signed)
Pulmonary Critical Care Medicine [facility]   PULMONARY CRITICAL CARE SERVICE  PROGRESS NOTE     Arah Aro  ZOX:[id number]  DOB: Nov 07, 1955   DOA: 07/09/2020  Referring Physician: Carron Curie, MD  HPI: Alessia Gonsalez is a 65 y.o. female seen for follow up of Acute on Chronic Respiratory Failure.  Patient is on the ventilator on full support and pressure control mode oxygen requirements are at 70%  Medications: Reviewed on Rounds  Physical Exam:  Vitals: Temperature is 97.3 pulse 91 respiratory 30 blood pressure is 167/85 saturations 92%  Ventilator Settings on pressure assist control FiO2 70% IP 25 tidal volume 300  . General: Comfortable at this time . Eyes: Grossly normal lids, irises & conjunctiva . ENT: grossly tongue is normal . Neck: no obvious mass . Cardiovascular: S1 S2 normal no gallop . Respiratory: No rhonchi very coarse breath sounds . Abdomen: soft . Skin: no rash seen on limited exam . Musculoskeletal: not rigid . Psychiatric:unable to assess . Neurologic: no seizure no involuntary movements         Lab Data:   Basic Metabolic Panel: Recent Labs  Lab 08/01/20 0438 08/05/20 0755  NA 137 135  K 3.7 3.7  CL 82* 77*  CO2 46* 50*  GLUCOSE 149* 186*  BUN 17 22  CREATININE <0.30* 0.35*  CALCIUM 8.7* 9.1  MG 2.0 2.1    ABG: No results for input(s): PHART, PCO2ART, PO2ART, HCO3, O2SAT in the last 168 hours.  Liver Function Tests: No results for input(s): AST, ALT, ALKPHOS, BILITOT, PROT, ALBUMIN in the last 168 hours. No results for input(s): LIPASE, AMYLASE in the last 168 hours. No results for input(s): AMMONIA in the last 168 hours.  CBC: Recent Labs  Lab 08/01/20 0438 08/05/20 0755  WBC 9.3 23.3*  HGB 7.7* 8.8*  HCT 26.0* 29.5*  MCV 100.4* 100.0  PLT 206 314    Cardiac Enzymes: No results for input(s): CKTOTAL, CKMB, CKMBINDEX, TROPONINI in the last 168 hours.  BNP (last 3 results) Recent Labs     05/31/20 1252  BNP 76.0    ProBNP (last 3 results) No results for input(s): PROBNP in the last 8760 hours.  Radiological Exams: DG CHEST PORT 1 VIEW  Result Date: 08/05/2020 CLINICAL DATA:  65 year old female status post COVID-19 with severe lung disease, large right pneumatocele. Tracheostomy. EXAM: PORTABLE CHEST 1 VIEW COMPARISON:  07/30/2020 and earlier. FINDINGS: Portable AP semi upright view at 0640 hours. Stable tracheostomy. Enteric tube removed since 07/30/2020. Large right pneumatocele/loculated pneumothorax with continued mass effect in the chest including leftward shift of the mediastinum. Mild progression compared to 07/22/2020. Left lung ventilation not significantly changed since 07/30/2020. No new pulmonary abnormality. Stable visualized osseous structures. IMPRESSION: 1. Enteric tube removed since 07/30/2020.  Stable tracheostomy. 2. Large right pneumatocele appears to be slowly enlarging with mass effect in the chest including leftward shift of the mediastinum. Stable left lung ventilation since 07/30/2020. Electronically Signed   By: Odessa Fleming M.D.   On: 08/05/2020 07:30    Assessment/Plan Active Problems:   Acute on chronic respiratory failure with hypoxia (HCC)   COVID-19 virus infection   Pneumonia due to COVID-19 virus   Pneumothorax, left   Metabolic encephalopathy   1. Acute on chronic respiratory failure with hypoxia history evaluation on the ventilator oxygen requirements steadily going up the prognosis is quite poor 2. COVID-19 virus infection in recovery with severe pulmonary damage 3. Pneumonia due to COVID-19 has been treated 4.  Pneumothorax with pneumatocele no change we will continue to monitor. 5. Metabolic encephalopathy no change   I have personally seen and evaluated the patient, evaluated laboratory and imaging results, formulated the assessment and plan and placed orders. The Patient requires high complexity decision making with multiple systems  involvement.  Rounds were done with the Respiratory Therapy Director and Staff therapists and discussed with nursing staff also.  Yevonne Pax, MD [facility] Pulmonary Critical Care Medicine Sleep Medicine

## 2020-08-07 LAB — CBC
HCT: 24.9 % — ABNORMAL LOW (ref 36.0–46.0)
Hemoglobin: 7.5 g/dL — ABNORMAL LOW (ref 12.0–15.0)
MCH: 30 pg (ref 26.0–34.0)
MCHC: 30.1 g/dL (ref 30.0–36.0)
MCV: 99.6 fL (ref 80.0–100.0)
Platelets: UNDETERMINED 10*3/uL (ref 150–400)
RBC: 2.5 MIL/uL — ABNORMAL LOW (ref 3.87–5.11)
RDW: 18 % — ABNORMAL HIGH (ref 11.5–15.5)
WBC: 13.7 10*3/uL — ABNORMAL HIGH (ref 4.0–10.5)
nRBC: 0.3 % — ABNORMAL HIGH (ref 0.0–0.2)

## 2020-08-07 LAB — BASIC METABOLIC PANEL
Anion gap: 9 (ref 5–15)
BUN: 25 mg/dL — ABNORMAL HIGH (ref 8–23)
CO2: 49 mmol/L — ABNORMAL HIGH (ref 22–32)
Calcium: 8.9 mg/dL (ref 8.9–10.3)
Chloride: 78 mmol/L — ABNORMAL LOW (ref 98–111)
Creatinine, Ser: <0.3 mg/dL — ABNORMAL LOW (ref 0.44–1.00)
Glucose, Bld: 202 mg/dL — ABNORMAL HIGH (ref 70–99)
Potassium: 4.2 mmol/L (ref 3.5–5.1)
Sodium: 136 mmol/L (ref 135–145)

## 2020-08-07 LAB — SEDIMENTATION RATE: Sed Rate: 107 mm/hr — ABNORMAL HIGH (ref 0–22)

## 2020-08-07 LAB — MAGNESIUM: Magnesium: 2.2 mg/dL (ref 1.7–2.4)

## 2020-08-07 LAB — C-REACTIVE PROTEIN: CRP: 2.9 mg/dL — ABNORMAL HIGH (ref <1.0)

## 2020-08-07 LAB — PHOSPHORUS: Phosphorus: 4.4 mg/dL (ref 2.5–4.6)

## 2020-08-07 NOTE — Progress Notes (Signed)
Pulmonary Critical Care Medicine [facility]   PULMONARY CRITICAL CARE SERVICE  PROGRESS NOTE     Rachel [name]  ZOX:[id number]  DOB: 1955-08-23   DOA: 07/09/2020  Referring Physician: Carron Curie, MD  HPI: Rachel [name] is a 65 y.o. female seen for follow up of Acute on Chronic Respiratory Failure.  Patient currently is on pressure control mode has been on 70% FiO2.  She is continuing not to have any significant improvement  Medications: Reviewed on Rounds  Physical Exam:  Vitals: Temperature is 98.6 pulse 91 respiratory rate 21 blood pressure is 143/70 saturations 99%  Ventilator Settings on pressure assist control FiO2 of 70% IP 25 PEEP 5  . General: Comfortable at this time . Eyes: Grossly normal lids, irises & conjunctiva . ENT: grossly tongue is normal . Neck: no obvious mass . Cardiovascular: S1 S2 normal no gallop . Respiratory: No rhonchi very coarse breath sounds . Abdomen: soft . Skin: no rash seen on limited exam . Musculoskeletal: not rigid . Psychiatric:unable to assess . Neurologic: no seizure no involuntary movements         Lab Data:   Basic Metabolic Panel: Recent Labs  Lab 08/01/20 0438 08/05/20 0755  NA 137 135  K 3.7 3.7  CL 82* 77*  CO2 46* 50*  GLUCOSE 149* 186*  BUN 17 22  CREATININE <0.30* 0.35*  CALCIUM 8.7* 9.1  MG 2.0 2.1    ABG: No results for input(s): PHART, PCO2ART, PO2ART, HCO3, O2SAT in the last 168 hours.  Liver Function Tests: No results for input(s): AST, ALT, ALKPHOS, BILITOT, PROT, ALBUMIN in the last 168 hours. No results for input(s): LIPASE, AMYLASE in the last 168 hours. No results for input(s): AMMONIA in the last 168 hours.  CBC: Recent Labs  Lab 08/01/20 0438 08/05/20 0755  WBC 9.3 23.3*  HGB 7.7* 8.8*  HCT 26.0* 29.5*  MCV 100.4* 100.0  PLT 206 314    Cardiac Enzymes: No results for input(s): CKTOTAL, CKMB, CKMBINDEX, TROPONINI in the last 168 hours.  BNP (last 3  results) Recent Labs    05/31/20 1252  BNP 76.0    ProBNP (last 3 results) No results for input(s): PROBNP in the last 8760 hours.  Radiological Exams: No results found.  Assessment/Plan Active Problems:   Acute on chronic respiratory failure with hypoxia (HCC)   COVID-19 virus infection   Pneumonia due to COVID-19 virus   Pneumothorax, left   Metabolic encephalopathy   1. Acute on chronic respiratory failure hypoxia plan is going to be to continue with full support on pressure control mode on 70% FiO2 with an IP of 25 2. COVID-19 virus infection recovery we will continue to follow along. 3. Pneumonia due to COVID-19 treated slow improvement 4. Pneumothorax status post pneumothorax with pneumatocele formation prognosis poor 5. Metabolic encephalopathy no change   I have personally seen and evaluated the patient, evaluated laboratory and imaging results, formulated the assessment and plan and placed orders. The Patient requires high complexity decision making with multiple systems involvement.  Rounds were done with the Respiratory Therapy Director and Staff therapists and discussed with nursing staff also.  Yevonne Pax, MD [facility] Pulmonary Critical Care Medicine Sleep Medicine

## 2020-08-08 LAB — C DIFFICILE (CDIFF) QUICK SCRN (NO PCR REFLEX)
C Diff antigen: NEGATIVE
C Diff interpretation: NOT DETECTED
C Diff toxin: NEGATIVE

## 2020-08-08 NOTE — Progress Notes (Signed)
Pulmonary Critical Care Medicine [facility]   PULMONARY CRITICAL CARE SERVICE  PROGRESS NOTE     Rachel [name]  [id number]  DOB: 11/16/1955   DOA: 07/09/2020  Referring Physician: Carron Curie, MD  HPI: Rachel [name] is a 65 y.o. female seen for follow up of Acute on Chronic Respiratory Failure.  The patient continues to not do well this morning patient's oxygen requirements are up to 100%.  Spoke with Dr. Sharyon Medicus yesterday and apparently he spoke with the family and the patient is now DNR  Medications: Reviewed on Rounds  Physical Exam:  Vitals: Temperature 98.1 pulse 84 respiratory 26 blood pressure is 116/67 saturations were 100%  Ventilator Settings on pressure assist control FiO2 100% IP 25 PEEP 5  . General: Comfortable at this time . Eyes: Grossly normal lids, irises & conjunctiva . ENT: grossly tongue is normal . Neck: no obvious mass . Cardiovascular: S1 S2 normal no gallop . Respiratory: No rhonchi no rales are noted at this time . Abdomen: soft . Skin: no rash seen on limited exam . Musculoskeletal: not rigid . Psychiatric:unable to assess . Neurologic: no seizure no involuntary movements         Lab Data:   Basic Metabolic Panel: Recent Labs  Lab 08/05/20 0755 08/07/20 0716  NA 135 136  K 3.7 4.2  CL 77* 78*  CO2 50* 49*  GLUCOSE 186* 202*  BUN 22 25*  CREATININE 0.35* <0.30*  CALCIUM 9.1 8.9  MG 2.1 2.2  PHOS  --  4.4    ABG: No results for input(s): PHART, PCO2ART, PO2ART, HCO3, O2SAT in the last 168 hours.  Liver Function Tests: No results for input(s): AST, ALT, ALKPHOS, BILITOT, PROT, ALBUMIN in the last 168 hours. No results for input(s): LIPASE, AMYLASE in the last 168 hours. No results for input(s): AMMONIA in the last 168 hours.  CBC: Recent Labs  Lab 08/05/20 0755 08/07/20 0716  WBC 23.3* 13.7*  HGB 8.8* 7.5*  HCT 29.5* 24.9*  MCV 100.0 99.6  PLT 314 PLATELET CLUMPS NOTED ON SMEAR, UNABLE TO  ESTIMATE    Cardiac Enzymes: No results for input(s): CKTOTAL, CKMB, CKMBINDEX, TROPONINI in the last 168 hours.  BNP (last 3 results) Recent Labs    05/31/20 1252  BNP 76.0    ProBNP (last 3 results) No results for input(s): PROBNP in the last 8760 hours.  Radiological Exams: No results found.  Assessment/Plan Active Problems:   Acute on chronic respiratory failure with hypoxia (HCC)   COVID-19 virus infection   Pneumonia due to COVID-19 virus   Pneumothorax, left   Metabolic encephalopathy   1. Acute on chronic respiratory failure with hypoxia plan is going to be to continue with full support on the ventilator.  Initially the idea was to see about trying pressure support however with her oxygen requirements it is not safe to do this at this time.  Also spoke with Dr. Sharyon Medicus overall not many options were left for [name] unfortunately and the family has now made her DNR which I think is appropriate as survival is poor 2. COVID-19 virus infection in recovery with severe residual pulmonary damage 3. Pneumonia due to COVID-19 4. Severe large pneumothorax pneumatocele as a result prognosis is poor 5. Metabolic encephalopathy no change   I have personally seen and evaluated the patient, evaluated laboratory and imaging results, formulated the assessment and plan and placed orders. The Patient requires high complexity decision making with multiple systems involvement.  Rounds  were done with the Respiratory Therapy Director and Staff therapists and discussed with nursing staff also.  Allyne Gee, MD [facility] Pulmonary Critical Care Medicine Sleep Medicine

## 2020-08-09 LAB — CBC
HCT: 27.2 % — ABNORMAL LOW (ref 36.0–46.0)
Hemoglobin: 8.2 g/dL — ABNORMAL LOW (ref 12.0–15.0)
MCH: 29.9 pg (ref 26.0–34.0)
MCHC: 30.1 g/dL (ref 30.0–36.0)
MCV: 99.3 fL (ref 80.0–100.0)
Platelets: 202 10*3/uL (ref 150–400)
RBC: 2.74 MIL/uL — ABNORMAL LOW (ref 3.87–5.11)
RDW: 18.1 % — ABNORMAL HIGH (ref 11.5–15.5)
WBC: 20.2 10*3/uL — ABNORMAL HIGH (ref 4.0–10.5)
nRBC: 0.1 % (ref 0.0–0.2)

## 2020-08-09 LAB — URINALYSIS, ROUTINE W REFLEX MICROSCOPIC
Bilirubin Urine: NEGATIVE
Glucose, UA: NEGATIVE mg/dL
Ketones, ur: NEGATIVE mg/dL
Nitrite: NEGATIVE
Protein, ur: 30 mg/dL — AB
RBC / HPF: >50 RBC/hpf — ABNORMAL HIGH (ref 0–5)
Specific Gravity, Urine: 1.02 (ref 1.005–1.030)
WBC, UA: >50 WBC/hpf — ABNORMAL HIGH (ref 0–5)
pH: 7 (ref 5.0–8.0)

## 2020-08-09 NOTE — Progress Notes (Signed)
Pulmonary Critical Care Medicine [facility]   PULMONARY CRITICAL CARE SERVICE  PROGRESS NOTE     Rachel [name]  [id number]  DOB: Jul 30, 1955   DOA: 07/09/2020  Referring Physician: Carron Curie, MD  HPI: Rachel [name] is a 65 y.o. female seen for follow up of Acute on Chronic Respiratory Failure.  Patient right now is on the ventilator full support apparently has had the IP increased to 29 spoke with respiratory therapy during rounds instructed to prop the IP and monitor the tidal volumes as there is significant concern about pneumothorax could occur in this patient.  Medications: Reviewed on Rounds  Physical Exam:  Vitals: Temperature is 98.4 pulse 104 respiratory 29 blood pressure is 117/67 saturations 94%  Ventilator Settings assist-control pressure control FiO2 of 80% IP 29 dropped to 25 PEEP 5  . General: Comfortable at this time . Eyes: Grossly normal lids, irises & conjunctiva . ENT: grossly tongue is normal . Neck: no obvious mass . Cardiovascular: S1 S2 normal no gallop . Respiratory: Scattered rhonchi expansion is equal . Abdomen: soft . Skin: no rash seen on limited exam . Musculoskeletal: not rigid . Psychiatric:unable to assess . Neurologic: no seizure no involuntary movements         Lab Data:   Basic Metabolic Panel: Recent Labs  Lab 08/05/20 0755 08/07/20 0716  NA 135 136  K 3.7 4.2  CL 77* 78*  CO2 50* 49*  GLUCOSE 186* 202*  BUN 22 25*  CREATININE 0.35* <0.30*  CALCIUM 9.1 8.9  MG 2.1 2.2  PHOS  --  4.4    ABG: No results for input(s): PHART, PCO2ART, PO2ART, HCO3, O2SAT in the last 168 hours.  Liver Function Tests: No results for input(s): AST, ALT, ALKPHOS, BILITOT, PROT, ALBUMIN in the last 168 hours. No results for input(s): LIPASE, AMYLASE in the last 168 hours. No results for input(s): AMMONIA in the last 168 hours.  CBC: Recent Labs  Lab 08/05/20 0755 08/07/20 0716 08/09/20 0833  WBC 23.3* 13.7*  20.2*  HGB 8.8* 7.5* 8.2*  HCT 29.5* 24.9* 27.2*  MCV 100.0 99.6 99.3  PLT 314 PLATELET CLUMPS NOTED ON SMEAR, UNABLE TO ESTIMATE 202    Cardiac Enzymes: No results for input(s): CKTOTAL, CKMB, CKMBINDEX, TROPONINI in the last 168 hours.  BNP (last 3 results) Recent Labs    05/31/20 1252  BNP 76.0    ProBNP (last 3 results) No results for input(s): PROBNP in the last 8760 hours.  Radiological Exams: No results found.  Assessment/Plan Active Problems:   Acute on chronic respiratory failure with hypoxia (HCC)   COVID-19 virus infection   Pneumonia due to COVID-19 virus   Pneumothorax, left   Metabolic encephalopathy   1. Acute on chronic respiratory failure hypoxia as above discussed dropping the pressure on this patient was to try to prevent pneumothorax.  We will also get an ABG. 2. COVID-19 virus infection unchanged 3. Pneumonia due to COVID-19 with severe damage to the pulmonary parenchyma 4. Pneumatocele with a history of pneumothorax supportive care 5. Metabolic encephalopathy no change   I have personally seen and evaluated the patient, evaluated laboratory and imaging results, formulated the assessment and plan and placed orders. The Patient requires high complexity decision making with multiple systems involvement.  Rounds were done with the Respiratory Therapy Director and Staff therapists and discussed with nursing staff also.  Yevonne Pax, MD [facility] Pulmonary Critical Care Medicine Sleep Medicine

## 2020-08-10 LAB — CBC
HCT: 25.9 % — ABNORMAL LOW (ref 36.0–46.0)
Hemoglobin: 7.7 g/dL — ABNORMAL LOW (ref 12.0–15.0)
MCH: 29.7 pg (ref 26.0–34.0)
MCHC: 29.7 g/dL — ABNORMAL LOW (ref 30.0–36.0)
MCV: 100 fL (ref 80.0–100.0)
Platelets: 213 10*3/uL (ref 150–400)
RBC: 2.59 MIL/uL — ABNORMAL LOW (ref 3.87–5.11)
RDW: 18.1 % — ABNORMAL HIGH (ref 11.5–15.5)
WBC: 18.5 10*3/uL — ABNORMAL HIGH (ref 4.0–10.5)
nRBC: 0 % (ref 0.0–0.2)

## 2020-08-10 LAB — PHOSPHORUS: Phosphorus: 4 mg/dL (ref 2.5–4.6)

## 2020-08-10 LAB — BASIC METABOLIC PANEL
BUN: 34 mg/dL — ABNORMAL HIGH (ref 8–23)
CO2: >50 mmol/L — ABNORMAL HIGH (ref 22–32)
Calcium: 9.2 mg/dL (ref 8.9–10.3)
Chloride: 75 mmol/L — ABNORMAL LOW (ref 98–111)
Creatinine, Ser: <0.3 mg/dL — ABNORMAL LOW (ref 0.44–1.00)
Glucose, Bld: 161 mg/dL — ABNORMAL HIGH (ref 70–99)
Potassium: 4.1 mmol/L (ref 3.5–5.1)
Sodium: 138 mmol/L (ref 135–145)

## 2020-08-10 LAB — MAGNESIUM: Magnesium: 2.1 mg/dL (ref 1.7–2.4)

## 2020-08-11 LAB — CULTURE, RESPIRATORY W GRAM STAIN

## 2020-08-12 LAB — BASIC METABOLIC PANEL
BUN: 31 mg/dL — ABNORMAL HIGH (ref 8–23)
CO2: >50 mmol/L — ABNORMAL HIGH (ref 22–32)
Calcium: 9.1 mg/dL (ref 8.9–10.3)
Chloride: 77 mmol/L — ABNORMAL LOW (ref 98–111)
Creatinine, Ser: <0.3 mg/dL — ABNORMAL LOW (ref 0.44–1.00)
Glucose, Bld: 84 mg/dL (ref 70–99)
Potassium: 3.9 mmol/L (ref 3.5–5.1)
Sodium: 139 mmol/L (ref 135–145)

## 2020-08-12 LAB — CBC
HCT: 26.2 % — ABNORMAL LOW (ref 36.0–46.0)
Hemoglobin: 7.7 g/dL — ABNORMAL LOW (ref 12.0–15.0)
MCH: 30.1 pg (ref 26.0–34.0)
MCHC: 29.4 g/dL — ABNORMAL LOW (ref 30.0–36.0)
MCV: 102.3 fL — ABNORMAL HIGH (ref 80.0–100.0)
Platelets: 237 10*3/uL (ref 150–400)
RBC: 2.56 MIL/uL — ABNORMAL LOW (ref 3.87–5.11)
RDW: 18.3 % — ABNORMAL HIGH (ref 11.5–15.5)
WBC: 18.7 10*3/uL — ABNORMAL HIGH (ref 4.0–10.5)
nRBC: 0 % (ref 0.0–0.2)

## 2020-08-12 LAB — URINE CULTURE: Culture: >=100000 — AB

## 2020-08-12 LAB — PHOSPHORUS: Phosphorus: 4.4 mg/dL (ref 2.5–4.6)

## 2020-08-12 LAB — MAGNESIUM: Magnesium: 2.4 mg/dL (ref 1.7–2.4)

## 2020-08-14 ENCOUNTER — Other Ambulatory Visit (HOSPITAL_COMMUNITY): Payer: Self-pay

## 2020-08-14 DIAGNOSIS — J984 Other disorders of lung: Secondary | ICD-10-CM

## 2020-08-14 NOTE — Progress Notes (Signed)
Pulmonary Critical Care Medicine [facility]   PULMONARY CRITICAL CARE SERVICE  PROGRESS NOTE     Rachel [name]  ZOX:[id number]  DOB: 11-Jan-1956   DOA: 07/09/2020  Referring Physician: Carron Curie, MD  HPI: Rachel [name] is a 65 y.o. female seen for follow up of Acute on Chronic Respiratory Failure.  Had a rather lengthy conversation during rounds with the primary care team regarding the patient's situation.  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  The left lung also does not look good as far as being able to oxygenate.  Her current oxygen requirements were 60% and on the 60% her saturations were about 88%.  The question that arose was can she be taken to T-bar without any serious consequences.  I believe that she is at high risk for having complications if she is taken off the ventilator and taken straight to T-bar.  There is a risk of cardiac arrest and acute decompensation which she may not be able to tolerate.  The primary care team is going to have a conversation with the family regarding these possibilities.  There is no direct evidence that this will work and I believe that she will not benefit from being taken off of the ventilator.  Indeed it may result in adverse events.  The primary care team will explain this to the family and I will be happy to be part of the conversation to try to answer any questions.  Overall her prognosis remains extremely poor and grave  Medications: Reviewed on Rounds  Physical Exam:  Vitals: Temperature 97.4 pulse 98 respiratory 18 blood pressure is 146/89 saturations 89%  Ventilator Settings on the ventilator in pressure assist control FiO2 of 60% IP 25 PEEP 5 tidal volumes were around 2 90-300  . General: Comfortable at this time . Eyes: Grossly normal lids, irises & conjunctiva . ENT: grossly tongue is normal . Neck: no obvious mass . Cardiovascular: S1 S2 normal no gallop . Respiratory: Very diminished on  the right coarse on the left . Abdomen: soft . Skin: no rash seen on limited exam . Musculoskeletal: not rigid . Psychiatric:unable to assess . Neurologic: no seizure no involuntary movements         Lab Data:   Basic Metabolic Panel: Recent Labs  Lab 08/10/20 0424 08/12/20 0948  NA 138 139  K 4.1 3.9  CL 75* 77*  CO2 >50* >50*  GLUCOSE 161* 84  BUN 34* 31*  CREATININE <0.30* <0.30*  CALCIUM 9.2 9.1  MG 2.1 2.4  PHOS 4.0 4.4    ABG: No results for input(s): PHART, PCO2ART, PO2ART, HCO3, O2SAT in the last 168 hours.  Liver Function Tests: No results for input(s): AST, ALT, ALKPHOS, BILITOT, PROT, ALBUMIN in the last 168 hours. No results for input(s): LIPASE, AMYLASE in the last 168 hours. No results for input(s): AMMONIA in the last 168 hours.  CBC: Recent Labs  Lab 08/09/20 0833 08/10/20 0424 08/12/20 0948  WBC 20.2* 18.5* 18.7*  HGB 8.2* 7.7* 7.7*  HCT 27.2* 25.9* 26.2*  MCV 99.3 100.0 102.3*  PLT 202 213 237    Cardiac Enzymes: No results for input(s): CKTOTAL, CKMB, CKMBINDEX, TROPONINI in the last 168 hours.  BNP (last 3 results) Recent Labs    05/31/20 1252  BNP 76.0    ProBNP (last 3 results) No results for input(s): PROBNP in the last 8760 hours.  Radiological Exams: DG Chest Port 1 View  Result Date: 08/14/2020 CLINICAL  DATA:  Respiratory failure EXAM: PORTABLE CHEST 1 VIEW COMPARISON:  08/05/2020 FINDINGS: Continued marked lucency with mass effect on the right which displaces the mediastinum to the left. This had an encysted appearance on March 2022 CT when there was still aerated right upper lobe. Coarse pulmonary opacities in the left lung. No visible pleural fluid. Normal heart size. Tracheostomy tube in place. IMPRESSION: Stable from 08/05/2020 Cyst/pneumatoceles essentially replacing the right lung, progressive from March 2022 CT and causing prominent mass effect on the mediastinum. Consider surgical referral and updated CT. Electronically  Signed   By: Marnee Spring M.D.   On: 08/14/2020 11:06    Assessment/Plan Active Problems:   Acute on chronic respiratory failure with hypoxia (HCC)   COVID-19 virus infection   Pneumonia due to COVID-19 virus   Pneumothorax, left   Metabolic encephalopathy   1. Acute on chronic respiratory failure with hypoxia overall she remains doing quite poorly remains on the ventilator right now and as already discussed above overall prognosis quite poor the patient is not likely to survive.  As far as taking her off of the ventilator this bears and it significantly high risk of complications and adverse events the primary care team is going to discuss further with the family.  COVID-19 virus infection has recovered but patient has severe pulmonary damage 2. Pneumonia due to COVID-19 patient has residual damage on the left lung and the right lung is essentially a large pneumatocele 3. Pneumatocele and pneumothorax on the right lung continues to show enlargement very poor prognosis 4. Encephalopathy no change   I have personally seen and evaluated the patient, evaluated laboratory and imaging results, formulated the assessment and plan and placed orders. The Patient requires high complexity decision making with multiple systems involvement.  Rounds were done with the Respiratory Therapy Director and Staff therapists and discussed with nursing staff also.  Yevonne Pax, MD [facility] Pulmonary Critical Care Medicine Sleep Medicine

## 2020-08-15 LAB — BASIC METABOLIC PANEL
Anion gap: UNDETERMINED (ref 5–15)
BUN: 41 mg/dL — ABNORMAL HIGH (ref 8–23)
CO2: >50 mmol/L — ABNORMAL HIGH (ref 22–32)
Calcium: 9.2 mg/dL (ref 8.9–10.3)
Chloride: 75 mmol/L — ABNORMAL LOW (ref 98–111)
Creatinine, Ser: 0.34 mg/dL — ABNORMAL LOW (ref 0.44–1.00)
GFR, Estimated: >60 mL/min (ref >60)
Glucose, Bld: 171 mg/dL — ABNORMAL HIGH (ref 70–99)
Potassium: 3.7 mmol/L (ref 3.5–5.1)
Sodium: 136 mmol/L (ref 135–145)

## 2020-08-15 LAB — PREPARE RBC (CROSSMATCH)

## 2020-08-15 LAB — CBC
HCT: 22.8 % — ABNORMAL LOW (ref 36.0–46.0)
Hemoglobin: 6.8 g/dL — CL (ref 12.0–15.0)
MCH: 30.8 pg (ref 26.0–34.0)
MCHC: 29.8 g/dL — ABNORMAL LOW (ref 30.0–36.0)
MCV: 103.2 fL — ABNORMAL HIGH (ref 80.0–100.0)
Platelets: 207 10*3/uL (ref 150–400)
RBC: 2.21 MIL/uL — ABNORMAL LOW (ref 3.87–5.11)
RDW: 18.8 % — ABNORMAL HIGH (ref 11.5–15.5)
WBC: 13.8 10*3/uL — ABNORMAL HIGH (ref 4.0–10.5)
nRBC: 0.3 % — ABNORMAL HIGH (ref 0.0–0.2)

## 2020-08-15 LAB — MAGNESIUM: Magnesium: 2.6 mg/dL — ABNORMAL HIGH (ref 1.7–2.4)

## 2020-08-15 LAB — OCCULT BLOOD X 1 CARD TO LAB, STOOL: Fecal Occult Bld: NEGATIVE

## 2020-08-15 NOTE — Progress Notes (Signed)
Pulmonary Critical Care Medicine [facility]   PULMONARY CRITICAL CARE SERVICE  PROGRESS NOTE     Billiejean Schimek  UYQ:[id number]  DOB: [birth year]   DOA: 07/09/2020  Referring Physician: Carron Curie, MD  HPI: Javiana Anwar is a 65 y.o. female seen for follow up of Acute on Chronic Respiratory Failure.  Patient right now is on full support and pressure control mode oxygen requirements are now at 70%  Medications: Reviewed on Rounds  Physical Exam:  Vitals: Temperature is 96.7 pulse 89 respiratory rate is 19 blood pressure is 123/73 saturations 95%  Ventilator Settings currently is on pressure control FiO2 70% IP 25 PEEP 5  . General: Comfortable at this time . Eyes: Grossly normal lids, irises & conjunctiva . ENT: grossly tongue is normal . Neck: no obvious mass . Cardiovascular: S1 S2 normal no gallop . Respiratory: No rhonchi very coarse breath sounds . Abdomen: soft . Skin: no rash seen on limited exam . Musculoskeletal: not rigid . Psychiatric:unable to assess . Neurologic: no seizure no involuntary movements         Lab Data:   Basic Metabolic Panel: Recent Labs  Lab 08/10/20 0424 08/12/20 0948 08/15/20 0358  NA 138 139 136  K 4.1 3.9 3.7  CL 75* 77* 75*  CO2 >50* >50* >50*  GLUCOSE 161* 84 171*  BUN 34* 31* 41*  CREATININE <0.30* <0.30* 0.34*  CALCIUM 9.2 9.1 9.2  MG 2.1 2.4 2.6*  PHOS 4.0 4.4  --     ABG: No results for input(s): PHART, PCO2ART, PO2ART, HCO3, O2SAT in the last 168 hours.  Liver Function Tests: No results for input(s): AST, ALT, ALKPHOS, BILITOT, PROT, ALBUMIN in the last 168 hours. No results for input(s): LIPASE, AMYLASE in the last 168 hours. No results for input(s): AMMONIA in the last 168 hours.  CBC: Recent Labs  Lab 08/09/20 0833 08/10/20 0424 08/12/20 0948 08/15/20 0358  WBC 20.2* 18.5* 18.7* 13.8*  HGB 8.2* 7.7* 7.7* 6.8*  HCT 27.2* 25.9* 26.2* 22.8*  MCV 99.3 100.0 102.3* 103.2*  PLT 202 213  237 207    Cardiac Enzymes: No results for input(s): CKTOTAL, CKMB, CKMBINDEX, TROPONINI in the last 168 hours.  BNP (last 3 results) Recent Labs    05/31/20 1252  BNP 76.0    ProBNP (last 3 results) No results for input(s): PROBNP in the last 8760 hours.  Radiological Exams: DG Chest Port 1 View  Result Date: 08/14/2020 CLINICAL DATA:  Respiratory failure EXAM: PORTABLE CHEST 1 VIEW COMPARISON:  08/05/2020 FINDINGS: Continued marked lucency with mass effect on the right which displaces the mediastinum to the left. This had an encysted appearance on March 2022 CT when there was still aerated right upper lobe. Coarse pulmonary opacities in the left lung. No visible pleural fluid. Normal heart size. Tracheostomy tube in place. IMPRESSION: Stable from 08/05/2020 Cyst/pneumatoceles essentially replacing the right lung, progressive from March 2022 CT and causing prominent mass effect on the mediastinum. Consider surgical referral and updated CT. Electronically Signed   By: Marnee Spring M.D.   On: 08/14/2020 11:06    Assessment/Plan Active Problems:   Acute on chronic respiratory failure with hypoxia (HCC)   COVID-19 virus infection   Pneumonia due to COVID-19 virus   Pneumothorax, left   Metabolic encephalopathy   1. Acute on chronic respiratory failure with hypoxia continues to do poorly remains on the ventilator and full support right now oxygen requirements are 70%.  Family still not decided about  goals of care here.  Patient apparently is tired and she is wanting to stop everything however family is not on board 2. COVID-19 virus infection in recovery 3. Pneumonia due to COVID-19 treated severe pulmonary damage from the underlying infection 4. Pneumatocele with pneumothorax history prognosis is poor 5. Metabolic encephalopathy she is more awake and is oriented and appropriate   I have personally seen and evaluated the patient, evaluated laboratory and imaging results,  formulated the assessment and plan and placed orders. The Patient requires high complexity decision making with multiple systems involvement.  Rounds were done with the Respiratory Therapy Director and Staff therapists and discussed with nursing staff also.  Yevonne Pax, MD [facility] Pulmonary Critical Care Medicine Sleep Medicine

## 2020-08-16 LAB — CBC
HCT: 27.5 % — ABNORMAL LOW (ref 36.0–46.0)
Hemoglobin: 8.4 g/dL — ABNORMAL LOW (ref 12.0–15.0)
MCH: 30.1 pg (ref 26.0–34.0)
MCHC: 30.5 g/dL (ref 30.0–36.0)
MCV: 98.6 fL (ref 80.0–100.0)
Platelets: 185 10*3/uL (ref 150–400)
RBC: 2.79 MIL/uL — ABNORMAL LOW (ref 3.87–5.11)
RDW: 20.2 % — ABNORMAL HIGH (ref 11.5–15.5)
WBC: 13.9 10*3/uL — ABNORMAL HIGH (ref 4.0–10.5)
nRBC: 0.4 % — ABNORMAL HIGH (ref 0.0–0.2)

## 2020-08-16 NOTE — Progress Notes (Signed)
Pulmonary Critical Care Medicine [facility]   PULMONARY CRITICAL CARE SERVICE  PROGRESS NOTE     Rachel [name]  [id number]  DOB: May 13, 1955   DOA: 07/09/2020  Referring Physician: Carron Curie, MD  HPI: Rachel [name] is a 65 y.o. female seen for follow up of Acute on Chronic Respiratory Failure.  She continues to do poorly remains on the ventilator chest x-ray results were reviewed  Medications: Reviewed on Rounds  Physical Exam:  Vitals: Temperature is 97.4 pulse 88 respiratory 24 blood pressure is 124/68 saturations 94%  Ventilator Settings on pressure assist control FiO2 70% IP 25 PEEP 5  . General: Comfortable at this time . Eyes: Grossly normal lids, irises & conjunctiva . ENT: grossly tongue is normal . Neck: no obvious mass . Cardiovascular: S1 S2 normal no gallop . Respiratory: Scattered rhonchi coarse breath sound . Abdomen: soft . Skin: no rash seen on limited exam . Musculoskeletal: not rigid . Psychiatric:unable to assess . Neurologic: no seizure no involuntary movements         Lab Data:   Basic Metabolic Panel: Recent Labs  Lab 08/10/20 0424 08/12/20 0948 08/15/20 0358  NA 138 139 136  K 4.1 3.9 3.7  CL 75* 77* 75*  CO2 >50* >50* >50*  GLUCOSE 161* 84 171*  BUN 34* 31* 41*  CREATININE <0.30* <0.30* 0.34*  CALCIUM 9.2 9.1 9.2  MG 2.1 2.4 2.6*  PHOS 4.0 4.4  --     ABG: No results for input(s): PHART, PCO2ART, PO2ART, HCO3, O2SAT in the last 168 hours.  Liver Function Tests: No results for input(s): AST, ALT, ALKPHOS, BILITOT, PROT, ALBUMIN in the last 168 hours. No results for input(s): LIPASE, AMYLASE in the last 168 hours. No results for input(s): AMMONIA in the last 168 hours.  CBC: Recent Labs  Lab 08/10/20 0424 08/12/20 0948 08/15/20 0358 08/16/20 0521  WBC 18.5* 18.7* 13.8* 13.9*  HGB 7.7* 7.7* 6.8* 8.4*  HCT 25.9* 26.2* 22.8* 27.5*  MCV 100.0 102.3* 103.2* 98.6  PLT 213 237 207 185    Cardiac  Enzymes: No results for input(s): CKTOTAL, CKMB, CKMBINDEX, TROPONINI in the last 168 hours.  BNP (last 3 results) Recent Labs    05/31/20 1252  BNP 76.0    ProBNP (last 3 results) No results for input(s): PROBNP in the last 8760 hours.  Radiological Exams: DG Chest Port 1 View  Result Date: 08/14/2020 CLINICAL DATA:  Respiratory failure EXAM: PORTABLE CHEST 1 VIEW COMPARISON:  08/05/2020 FINDINGS: Continued marked lucency with mass effect on the right which displaces the mediastinum to the left. This had an encysted appearance on March 2022 CT when there was still aerated right upper lobe. Coarse pulmonary opacities in the left lung. No visible pleural fluid. Normal heart size. Tracheostomy tube in place. IMPRESSION: Stable from 08/05/2020 Cyst/pneumatoceles essentially replacing the right lung, progressive from March 2022 CT and causing prominent mass effect on the mediastinum. Consider surgical referral and updated CT. Electronically Signed   By: Marnee Spring M.D.   On: 08/14/2020 11:06    Assessment/Plan Active Problems:   Acute on chronic respiratory failure with hypoxia (HCC)   COVID-19 virus infection   Pneumonia due to COVID-19 virus   Pneumothorax, left   Metabolic encephalopathy   1. Acute on chronic respiratory failure hypoxia plan is going to be to continue with full support on the ventilator she is currently on 70% FiO2 prognosis is poor 2. COVID-19 virus infection recovery we will continue to  monitor closely. 3. Pneumatocele left side we will continue his supportive care poor prognosis enlarging 4. Metabolic encephalopathy no change   I have personally seen and evaluated the patient, evaluated laboratory and imaging results, formulated the assessment and plan and placed orders. The Patient requires high complexity decision making with multiple systems involvement.  Rounds were done with the Respiratory Therapy Director and Staff therapists and discussed with  nursing staff also.  Yevonne Pax, MD [facility] Pulmonary Critical Care Medicine Sleep Medicine

## 2020-08-17 NOTE — Progress Notes (Signed)
Pulmonary Critical Care Medicine [facility]   PULMONARY CRITICAL CARE SERVICE  PROGRESS NOTE     Rachel [name]  [id number]  DOB: May 25, 1955   DOA: 07/09/2020  Referring Physician: Carron Curie, MD  HPI: Rachel [name] is a 65 y.o. female seen for follow up of Acute on Chronic Respiratory Failure.  Patient is comfortable right now without distress remains on the ventilator.  Spoke to pulmonary regarding the possibility of doing bronchial plugging to see if there is even a potential following the pneumatocele reabsorb.  We are going to try to contact [facility] and [facility] to see if any interventional list would be willing to assess her.  Medications: Reviewed on Rounds  Physical Exam:  Vitals: Temperature is 96.6 pulse 70 respiratory 22 blood pressure is 126/73 saturations 99%  Ventilator Settings on pressure assist control FiO2 70% IP 25 PEEP 5  . General: Comfortable at this time . Eyes: Grossly normal lids, irises & conjunctiva . ENT: grossly tongue is normal . Neck: no obvious mass . Cardiovascular: S1 S2 normal no gallop . Respiratory: No rhonchi very coarse percent . Abdomen: soft . Skin: no rash seen on limited exam . Musculoskeletal: not rigid . Psychiatric:unable to assess . Neurologic: no seizure no involuntary movements         Lab Data:   Basic Metabolic Panel: Recent Labs  Lab 08/12/20 0948 08/15/20 0358  NA 139 136  K 3.9 3.7  CL 77* 75*  CO2 >50* >50*  GLUCOSE 84 171*  BUN 31* 41*  CREATININE <0.30* 0.34*  CALCIUM 9.1 9.2  MG 2.4 2.6*  PHOS 4.4  --     ABG: No results for input(s): PHART, PCO2ART, PO2ART, HCO3, O2SAT in the last 168 hours.  Liver Function Tests: No results for input(s): AST, ALT, ALKPHOS, BILITOT, PROT, ALBUMIN in the last 168 hours. No results for input(s): LIPASE, AMYLASE in the last 168 hours. No results for input(s): AMMONIA in the last 168 hours.  CBC: Recent Labs  Lab 08/12/20 0948 08/15/20 0358  08/16/20 0521  WBC 18.7* 13.8* 13.9*  HGB 7.7* 6.8* 8.4*  HCT 26.2* 22.8* 27.5*  MCV 102.3* 103.2* 98.6  PLT 237 207 185    Cardiac Enzymes: No results for input(s): CKTOTAL, CKMB, CKMBINDEX, TROPONINI in the last 168 hours.  BNP (last 3 results) Recent Labs    05/31/20 1252  BNP 76.0    ProBNP (last 3 results) No results for input(s): PROBNP in the last 8760 hours.  Radiological Exams: No results found.  Assessment/Plan Active Problems:   Acute on chronic respiratory failure with hypoxia (HCC)   COVID-19 virus infection   Pneumonia due to COVID-19 virus   Pneumothorax, left   Metabolic encephalopathy   1. Acute on chronic respiratory failure hypoxia on full support and the ventilator and pressure assist control mode.  We will try to see about getting transfer to tertiary level to see if any interventional pulmonary bronchial valve placement may be of any help 2. COVID-19 virus infection recovery.  Continue supportive care 3. Pneumatocele large right-sided pneumatocele prognosis guarded 4. Pneumonia due to COVID-19 slow improvement 5. Metabolic encephalopathy clinically improved   I have personally seen and evaluated the patient, evaluated laboratory and imaging results, formulated the assessment and plan and placed orders. The Patient requires high complexity decision making with multiple systems involvement.  Rounds were done with the Respiratory Therapy Director and Staff therapists and discussed with nursing staff also.  Yevonne Pax, MD [facility]  Pulmonary Critical Care Medicine Sleep Medicine

## 2020-08-19 LAB — CBC
HCT: 26.2 % — ABNORMAL LOW (ref 36.0–46.0)
Hemoglobin: 8 g/dL — ABNORMAL LOW (ref 12.0–15.0)
MCH: 31 pg (ref 26.0–34.0)
MCHC: 30.5 g/dL (ref 30.0–36.0)
MCV: 101.6 fL — ABNORMAL HIGH (ref 80.0–100.0)
Platelets: 157 10*3/uL (ref 150–400)
RBC: 2.58 MIL/uL — ABNORMAL LOW (ref 3.87–5.11)
RDW: 18.5 % — ABNORMAL HIGH (ref 11.5–15.5)
WBC: 8.3 10*3/uL (ref 4.0–10.5)
nRBC: 0 % (ref 0.0–0.2)

## 2020-08-19 LAB — TYPE AND SCREEN
ABO/RH(D): O POS
Antibody Screen: NEGATIVE
Unit division: 0
Unit division: 0
Unit division: 0

## 2020-08-19 LAB — BASIC METABOLIC PANEL
Anion gap: 8 (ref 5–15)
BUN: 31 mg/dL — ABNORMAL HIGH (ref 8–23)
CO2: 49 mmol/L — ABNORMAL HIGH (ref 22–32)
Calcium: 8.9 mg/dL (ref 8.9–10.3)
Chloride: 78 mmol/L — ABNORMAL LOW (ref 98–111)
Creatinine, Ser: 0.3 mg/dL — ABNORMAL LOW (ref 0.44–1.00)
GFR, Estimated: >60 mL/min (ref >60)
Glucose, Bld: 210 mg/dL — ABNORMAL HIGH (ref 70–99)
Potassium: 3.7 mmol/L (ref 3.5–5.1)
Sodium: 135 mmol/L (ref 135–145)

## 2020-08-19 LAB — BPAM RBC
Blood Product Expiration Date: 202205072359
Blood Product Expiration Date: 202205092359
Blood Product Expiration Date: 202206042359
ISSUE DATE / TIME: 202205051059
ISSUE DATE / TIME: 202205062345
Unit Type and Rh: 5100
Unit Type and Rh: 9500
Unit Type and Rh: 9500

## 2020-08-19 LAB — PHOSPHORUS: Phosphorus: 3.5 mg/dL (ref 2.5–4.6)

## 2020-08-19 LAB — MAGNESIUM: Magnesium: 2.3 mg/dL (ref 1.7–2.4)

## 2020-08-19 NOTE — Progress Notes (Signed)
Pulmonary Critical Care Medicine [facility]   PULMONARY CRITICAL CARE SERVICE  PROGRESS NOTE     Rachel [name]  [id number]  DOB: 1956/02/23   DOA: 07/09/2020  Referring Physician: Carron Curie, MD  HPI: Rachel [name] is a 65 y.o. female seen for follow up of Acute on Chronic Respiratory Failure.  She remains on the ventilator.  Had lengthy discussions over the weekend with multiple consultants.  Not a candidate for any intervention based on conversations at [place] as follows [employer].  Dr. Sharyon Medicus spoke with the [facility] interventional list and there is nothing that they can offer her at this point.  Medications: Reviewed on Rounds  Physical Exam:  Vitals: Temperature is 97.0 pulse 80 respiratory rate is 20 blood pressure is 120/75 saturations 100%  Ventilator Settings pressure assist control FiO2 60% IP 25 PEEP 5  . General: Comfortable at this time . Eyes: Grossly normal lids, irises & conjunctiva . ENT: grossly tongue is normal . Neck: no obvious mass . Cardiovascular: S1 S2 normal no gallop . Respiratory: No rhonchi very coarse breath sound . Abdomen: soft . Skin: no rash seen on limited exam . Musculoskeletal: not rigid . Psychiatric:unable to assess . Neurologic: no seizure no involuntary movements         Lab Data:   Basic Metabolic Panel: Recent Labs  Lab 08/12/20 0948 08/15/20 0358  NA 139 136  K 3.9 3.7  CL 77* 75*  CO2 >50* >50*  GLUCOSE 84 171*  BUN 31* 41*  CREATININE <0.30* 0.34*  CALCIUM 9.1 9.2  MG 2.4 2.6*  PHOS 4.4  --     ABG: No results for input(s): PHART, PCO2ART, PO2ART, HCO3, O2SAT in the last 168 hours.  Liver Function Tests: No results for input(s): AST, ALT, ALKPHOS, BILITOT, PROT, ALBUMIN in the last 168 hours. No results for input(s): LIPASE, AMYLASE in the last 168 hours. No results for input(s): AMMONIA in the last 168 hours.  CBC: Recent Labs  Lab 08/12/20 0948 08/15/20 0358 08/16/20 0521  08/19/20 0713  WBC 18.7* 13.8* 13.9* 8.3  HGB 7.7* 6.8* 8.4* 8.0*  HCT 26.2* 22.8* 27.5* 26.2*  MCV 102.3* 103.2* 98.6 101.6*  PLT 237 207 185 157    Cardiac Enzymes: No results for input(s): CKTOTAL, CKMB, CKMBINDEX, TROPONINI in the last 168 hours.  BNP (last 3 results) Recent Labs    05/31/20 1252  BNP 76.0    ProBNP (last 3 results) No results for input(s): PROBNP in the last 8760 hours.  Radiological Exams: No results found.  Assessment/Plan Active Problems:   Acute on chronic respiratory failure with hypoxia (HCC)   COVID-19 virus infection   Pneumonia due to COVID-19 virus   Pneumothorax, left   Metabolic encephalopathy   1. Acute on chronic respiratory failure hypoxia plan is going to be to continue with pressure assist control titrate oxygen as tolerated continue pulmonary toilet. 2. COVID-19 virus infection in recovery phase 3. Pneumonia due to COVID-19 treated 4. Pneumothorax resolved but patient does have a large pneumatocele which is not resolving. 5. Metabolic encephalopathy we will continue to follow along.   I have personally seen and evaluated the patient, evaluated laboratory and imaging results, formulated the assessment and plan and placed orders. The Patient requires high complexity decision making with multiple systems involvement.  Rounds were done with the Respiratory Therapy Director and Staff therapists and discussed with nursing staff also.  Yevonne Pax, MD [facility] Pulmonary Critical Care Medicine Sleep Medicine

## 2020-08-20 NOTE — Progress Notes (Signed)
Pulmonary Critical Care Medicine [facility]   PULMONARY CRITICAL CARE SERVICE  PROGRESS NOTE     Rachel [name]  [id number]  DOB: 1955-06-01   DOA: 07/09/2020  Referring Physician: Carron Curie, MD  HPI: Rachel [name] is a 65 y.o. female seen for follow up of Acute on Chronic Respiratory Failure.  Patient is on the ventilator and full support ventilation pressure control mode. No changes noted no improvement noted.  Oxygen requirements went down to 55% rales  Medications: Reviewed on Rounds  Physical Exam:  Vitals: Temperature 96.2 pulse 72 respiratory 30 blood pressure is 119/69 saturations 98%  Ventilator Settings on pressure assist control FiO2 is 55% IP 25 PEEP 5  . General: Comfortable at this time . Eyes: Grossly normal lids, irises & conjunctiva . ENT: grossly tongue is normal . Neck: no obvious mass . Cardiovascular: S1 S2 normal no gallop . Respiratory: No rhonchi no rales are noted at this time . Abdomen: soft . Skin: no rash seen on limited exam . Musculoskeletal: not rigid . Psychiatric:unable to assess . Neurologic: no seizure no involuntary movements         Lab Data:   Basic Metabolic Panel: Recent Labs  Lab 08/15/20 0358 08/19/20 0713  NA 136 135  K 3.7 3.7  CL 75* 78*  CO2 >50* 49*  GLUCOSE 171* 210*  BUN 41* 31*  CREATININE 0.34* 0.30*  CALCIUM 9.2 8.9  MG 2.6* 2.3  PHOS  --  3.5    ABG: No results for input(s): PHART, PCO2ART, PO2ART, HCO3, O2SAT in the last 168 hours.  Liver Function Tests: No results for input(s): AST, ALT, ALKPHOS, BILITOT, PROT, ALBUMIN in the last 168 hours. No results for input(s): LIPASE, AMYLASE in the last 168 hours. No results for input(s): AMMONIA in the last 168 hours.  CBC: Recent Labs  Lab 08/15/20 0358 08/16/20 0521 08/19/20 0713  WBC 13.8* 13.9* 8.3  HGB 6.8* 8.4* 8.0*  HCT 22.8* 27.5* 26.2*  MCV 103.2* 98.6 101.6*  PLT 207 185 157    Cardiac Enzymes: No results  for input(s): CKTOTAL, CKMB, CKMBINDEX, TROPONINI in the last 168 hours.  BNP (last 3 results) Recent Labs    05/31/20 1252  BNP 76.0    ProBNP (last 3 results) No results for input(s): PROBNP in the last 8760 hours.  Radiological Exams: No results found.  Assessment/Plan Active Problems:   Acute on chronic respiratory failure with hypoxia (HCC)   COVID-19 virus infection   Pneumonia due to COVID-19 virus   Pneumothorax, left   Metabolic encephalopathy   1. Acute on chronic respiratory failure hypoxia patient remains on the ventilator for support.  Oxygen requirement slightly better 2. COVID-19 virus infection recovery phase we will continue to follow along 3. Pneumonia due to COVID-19 treated 4. Metabolic encephalopathy no change 5. Pneumothorax patient is at baseline with large pneumatocele   I have personally seen and evaluated the patient, evaluated laboratory and imaging results, formulated the assessment and plan and placed orders. The Patient requires high complexity decision making with multiple systems involvement.  Rounds were done with the Respiratory Therapy Director and Staff therapists and discussed with nursing staff also.  Yevonne Pax, MD [facility] Pulmonary Critical Care Medicine Sleep Medicine

## 2020-08-21 NOTE — Progress Notes (Signed)
Pulmonary Critical Care Medicine [facility]   PULMONARY CRITICAL CARE SERVICE  PROGRESS NOTE     Rachel [name]  [id number]:[id number]  DOB: 10/23/1955   DOA: 07/09/2020  Referring Physician: Carron Curie, MD  HPI: Rachel [name] is a 66 y.o. female seen for follow up of Acute on Chronic Respiratory Failure.  Patient is currently on pressure assist control mode has been on 45% FiO2.  Reportedly she was sitting at the edge of the bed today  Medications: Reviewed on Rounds  Physical Exam:  Vitals: Temperature is 96.4 pulse 92 respiratory 36 blood pressure is 137/77 saturations 100%  Ventilator Settings on pressure assist control FiO2 45% IP 25 PEEP 5  . General: Comfortable at this time . Eyes: Grossly normal lids, irises & conjunctiva . ENT: grossly tongue is normal . Neck: no obvious mass . Cardiovascular: S1 S2 normal no gallop . Respiratory: No rhonchi no rales noted . Abdomen: soft . Skin: no rash seen on limited exam . Musculoskeletal: not rigid . Psychiatric:unable to assess . Neurologic: no seizure no involuntary movements         Lab Data:   Basic Metabolic Panel: Recent Labs  Lab 08/15/20 0358 08/19/20 0713  NA 136 135  K 3.7 3.7  CL 75* 78*  CO2 >50* 49*  GLUCOSE 171* 210*  BUN 41* 31*  CREATININE 0.34* 0.30*  CALCIUM 9.2 8.9  MG 2.6* 2.3  PHOS  --  3.5    ABG: No results for input(s): PHART, PCO2ART, PO2ART, HCO3, O2SAT in the last 168 hours.  Liver Function Tests: No results for input(s): AST, ALT, ALKPHOS, BILITOT, PROT, ALBUMIN in the last 168 hours. No results for input(s): LIPASE, AMYLASE in the last 168 hours. No results for input(s): AMMONIA in the last 168 hours.  CBC: Recent Labs  Lab 08/15/20 0358 08/16/20 0521 08/19/20 0713  WBC 13.8* 13.9* 8.3  HGB 6.8* 8.4* 8.0*  HCT 22.8* 27.5* 26.2*  MCV 103.2* 98.6 101.6*  PLT 207 185 157    Cardiac Enzymes: No results for input(s): CKTOTAL, CKMB, CKMBINDEX, TROPONINI  in the last 168 hours.  BNP (last 3 results) Recent Labs    05/31/20 1252  BNP 76.0    ProBNP (last 3 results) No results for input(s): PROBNP in the last 8760 hours.  Radiological Exams: No results found.  Assessment/Plan Active Problems:   Acute on chronic respiratory failure with hypoxia (HCC)   COVID-19 virus infection   Pneumonia due to COVID-19 virus   Pneumothorax, left   Metabolic encephalopathy   1. Acute on chronic respiratory failure hypoxia plan is to continue with full support on the ventilator she is not a candidate for weaning 2. COVID-19 virus infection recovery phase we will continue to monitor 3. Pneumonia due to COVID-19 has been treated 4. Large pneumatocele supportive care no intervention available 5. Metabolic encephalopathy she is at her baseline now   I have personally seen and evaluated the patient, evaluated laboratory and imaging results, formulated the assessment and plan and placed orders. The Patient requires high complexity decision making with multiple systems involvement.  Rounds were done with the Respiratory Therapy Director and Staff therapists and discussed with nursing staff also.  Yevonne Pax, MD [facility] Pulmonary Critical Care Medicine Sleep Medicine

## 2020-08-22 ENCOUNTER — Other Ambulatory Visit (HOSPITAL_COMMUNITY): Payer: Self-pay

## 2020-08-22 LAB — RENAL FUNCTION PANEL
Albumin: 2.5 g/dL — ABNORMAL LOW (ref 3.5–5.0)
Anion gap: 8 (ref 5–15)
BUN: 34 mg/dL — ABNORMAL HIGH (ref 8–23)
CO2: 46 mmol/L — ABNORMAL HIGH (ref 22–32)
Calcium: 8.8 mg/dL — ABNORMAL LOW (ref 8.9–10.3)
Chloride: 82 mmol/L — ABNORMAL LOW (ref 98–111)
Creatinine, Ser: <0.3 mg/dL — ABNORMAL LOW (ref 0.44–1.00)
Glucose, Bld: 170 mg/dL — ABNORMAL HIGH (ref 70–99)
Phosphorus: 4.3 mg/dL (ref 2.5–4.6)
Potassium: 3.4 mmol/L — ABNORMAL LOW (ref 3.5–5.1)
Sodium: 136 mmol/L (ref 135–145)

## 2020-08-22 LAB — CBC
HCT: 26.9 % — ABNORMAL LOW (ref 36.0–46.0)
Hemoglobin: 8.4 g/dL — ABNORMAL LOW (ref 12.0–15.0)
MCH: 31.1 pg (ref 26.0–34.0)
MCHC: 31.2 g/dL (ref 30.0–36.0)
MCV: 99.6 fL (ref 80.0–100.0)
Platelets: 139 10*3/uL — ABNORMAL LOW (ref 150–400)
RBC: 2.7 MIL/uL — ABNORMAL LOW (ref 3.87–5.11)
RDW: 18.5 % — ABNORMAL HIGH (ref 11.5–15.5)
WBC: 8.5 10*3/uL (ref 4.0–10.5)
nRBC: 0.2 % (ref 0.0–0.2)

## 2020-08-22 LAB — MAGNESIUM: Magnesium: 2.3 mg/dL (ref 1.7–2.4)

## 2020-08-22 NOTE — Progress Notes (Signed)
Pulmonary Critical Care Medicine [facility]   PULMONARY CRITICAL CARE SERVICE  PROGRESS NOTE     Rachel [name]  XBL:[id number]  DOB: [birth year]   DOA: 07/09/2020  Referring Physician: Carron Curie, MD  HPI: Rachel [name] is a 65 y.o. female seen for follow up of Acute on Chronic Respiratory Failure.  Patient is resting comfortably right now without distress remains on the ventilator oxygen is down to 45%  Medications: Reviewed on Rounds  Physical Exam:  Vitals: Temperature is 97.7 pulse 86 respiratory 17 blood pressure is 109/70 saturations 100%  Ventilator Settings on pressure assist control FiO2 45% IP 25 PEEP 5  . General: Comfortable at this time . Eyes: Grossly normal lids, irises & conjunctiva . ENT: grossly tongue is normal . Neck: no obvious mass . Cardiovascular: S1 S2 normal no gallop . Respiratory: No rhonchi no rales are noted at this time . Abdomen: soft . Skin: no rash seen on limited exam . Musculoskeletal: not rigid . Psychiatric:unable to assess . Neurologic: no seizure no involuntary movements         Lab Data:   Basic Metabolic Panel: Recent Labs  Lab 08/19/20 0713 08/22/20 0456  NA 135 136  K 3.7 3.4*  CL 78* 82*  CO2 49* 46*  GLUCOSE 210* 170*  BUN 31* 34*  CREATININE 0.30* <0.30*  CALCIUM 8.9 8.8*  MG 2.3 2.3  PHOS 3.5 4.3    ABG: No results for input(s): PHART, PCO2ART, PO2ART, HCO3, O2SAT in the last 168 hours.  Liver Function Tests: Recent Labs  Lab 08/22/20 0456  ALBUMIN 2.5*   No results for input(s): LIPASE, AMYLASE in the last 168 hours. No results for input(s): AMMONIA in the last 168 hours.  CBC: Recent Labs  Lab 08/16/20 0521 08/19/20 0713 08/22/20 0456  WBC 13.9* 8.3 8.5  HGB 8.4* 8.0* 8.4*  HCT 27.5* 26.2* 26.9*  MCV 98.6 101.6* 99.6  PLT 185 157 139*    Cardiac Enzymes: No results for input(s): CKTOTAL, CKMB, CKMBINDEX, TROPONINI in the last 168 hours.  BNP (last 3  results) Recent Labs    05/31/20 1252  BNP 76.0    ProBNP (last 3 results) No results for input(s): PROBNP in the last 8760 hours.  Radiological Exams: DG CHEST PORT 1 VIEW  Result Date: 08/22/2020 CLINICAL DATA:  Pneumonia.  Pneumonitis seal EXAM: PORTABLE CHEST 1 VIEW COMPARISON:  08/14/2020 FINDINGS: Large gas collection in the right chest correlating with pneumatocele by most recent chest CT. Continued marked mass effect on the mediastinum which is deviated to the left. 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 Smaller pneumatocele at the left apex as well. Normal heart size. Tracheostomy tube in place. IMPRESSION: Stable from 08/14/2020. [facility] right chest with marked mass effect, correlating with a large pneumatocele on March 2022 chest CT. A superimposed pneumothorax would be indistinguishable. Consider updated chest CT. Electronically Signed   By: Marnee Spring M.D.   On: 08/22/2020 06:20    Assessment/Plan Active Problems:   Acute on chronic respiratory failure with hypoxia (HCC)   COVID-19 virus infection   Pneumonia due to COVID-19 virus   Pneumothorax, left   Metabolic encephalopathy   1. Acute on chronic respiratory failure with hypoxia plan is going to be to continue with full support on the ventilator.  She is deemed as normal interval 2. COVID-19 virus infection currently 3. Pneumonia due to COVID-19 treated 4. Pneumothorax resolved with large pneumatocele on the right side 5. Metabolic encephalopathy she is at  her baseline   I have personally seen and evaluated the patient, evaluated laboratory and imaging results, formulated the assessment and plan and placed orders. The Patient requires high complexity decision making with multiple systems involvement.  Rounds were done with the Respiratory Therapy Director and Staff therapists and discussed with nursing staff also.  Yevonne Pax, MD [facility] Pulmonary Critical Care Medicine Sleep Medicine

## 2020-08-23 LAB — POTASSIUM
Potassium: 5.1 mmol/L (ref 3.5–5.1)
Potassium: 3.5 mmol/L (ref 3.5–5.1)

## 2020-08-23 NOTE — Progress Notes (Signed)
Pulmonary Critical Care Medicine [facility]   PULMONARY CRITICAL CARE SERVICE  PROGRESS NOTE     Rachel [name]  WUJ:[id number]  DOB: 28-Jul-1955   DOA: 07/09/2020  Referring Physician: Carron Curie, MD  HPI: Rachel [name] is a 65 y.o. female seen for follow up of Acute on Chronic Respiratory Failure.  Patient right now is on pressure control mode has been on 45% FiO2 on full support  Medications: Reviewed on Rounds  Physical Exam:  Vitals: Temperature 96.8 pulse 83 respiratory rate is 21 blood pressure is 120/68 saturations 94%  Ventilator Settings on pressure assist control FiO2 45% tidal volume 400 IP 25  . General: Comfortable at this time . Eyes: Grossly normal lids, irises & conjunctiva . ENT: grossly tongue is normal . Neck: no obvious mass . Cardiovascular: S1 S2 normal no gallop . Respiratory: Scattered rhonchi very coarse percent . Abdomen: soft . Skin: no rash seen on limited exam . Musculoskeletal: not rigid . Psychiatric:unable to assess . Neurologic: no seizure no involuntary movements         Lab Data:   Basic Metabolic Panel: Recent Labs  Lab 08/19/20 0713 08/22/20 0456 08/23/20 0356 08/23/20 0801  NA 135 136  --   --   K 3.7 3.4* 5.1 3.5  CL 78* 82*  --   --   CO2 49* 46*  --   --   GLUCOSE 210* 170*  --   --   BUN 31* 34*  --   --   CREATININE 0.30* <0.30*  --   --   CALCIUM 8.9 8.8*  --   --   MG 2.3 2.3  --   --   PHOS 3.5 4.3  --   --     ABG: No results for input(s): PHART, PCO2ART, PO2ART, HCO3, O2SAT in the last 168 hours.  Liver Function Tests: Recent Labs  Lab 08/22/20 0456  ALBUMIN 2.5*   No results for input(s): LIPASE, AMYLASE in the last 168 hours. No results for input(s): AMMONIA in the last 168 hours.  CBC: Recent Labs  Lab 08/19/20 0713 08/22/20 0456  WBC 8.3 8.5  HGB 8.0* 8.4*  HCT 26.2* 26.9*  MCV 101.6* 99.6  PLT 157 139*    Cardiac Enzymes: No results for input(s): CKTOTAL, CKMB,  CKMBINDEX, TROPONINI in the last 168 hours.  BNP (last 3 results) Recent Labs    05/31/20 1252  BNP 76.0    ProBNP (last 3 results) No results for input(s): PROBNP in the last 8760 hours.  Radiological Exams: DG CHEST PORT 1 VIEW  Result Date: 08/22/2020 CLINICAL DATA:  Pneumonia.  Pneumonitis seal EXAM: PORTABLE CHEST 1 VIEW COMPARISON:  08/14/2020 FINDINGS: Large gas collection in the right chest correlating with pneumatocele by most recent chest CT. Continued marked mass effect on the mediastinum which is deviated to the left. 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 Smaller pneumatocele at the left apex as well. Normal heart size. Tracheostomy tube in place. IMPRESSION: Stable from 08/14/2020. [facility] right chest with marked mass effect, correlating with a large pneumatocele on March 2022 chest CT. A superimposed pneumothorax would be indistinguishable. Consider updated chest CT. Electronically Signed   By: Marnee Spring M.D.   On: 08/22/2020 06:20    Assessment/Plan Active Problems:   Acute on chronic respiratory failure with hypoxia (HCC)   COVID-19 virus infection   Pneumonia due to COVID-19 virus   Pneumothorax, left   Metabolic encephalopathy   1. Acute on chronic respiratory  failure hypoxia plan is going to be to continue with full support on the ventilator the left lung actually is looking somewhat improvement and with continue to monitor oxygen requirements and maintain saturations around 88% and above 2. Pneumonia due to COVID-19 slow improvement we will continue to follow 3. COVID-19 virus infection in recovery 4. 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 Metabolic encephalopathy no change   I have personally seen and evaluated the patient, evaluated laboratory and imaging results, formulated the assessment and plan and placed orders. The Patient requires high complexity decision making with multiple systems involvement.  Rounds were done with the Respiratory  Therapy Director and Staff therapists and discussed with nursing staff also.  Yevonne Pax, MD [facility] Pulmonary Critical Care Medicine Sleep Medicine

## 2020-08-24 LAB — BASIC METABOLIC PANEL
Anion gap: 10 (ref 5–15)
BUN: 31 mg/dL — ABNORMAL HIGH (ref 8–23)
CO2: 42 mmol/L — ABNORMAL HIGH (ref 22–32)
Calcium: 8.8 mg/dL — ABNORMAL LOW (ref 8.9–10.3)
Chloride: 84 mmol/L — ABNORMAL LOW (ref 98–111)
Creatinine, Ser: <0.3 mg/dL — ABNORMAL LOW (ref 0.44–1.00)
Glucose, Bld: 151 mg/dL — ABNORMAL HIGH (ref 70–99)
Potassium: 3.5 mmol/L (ref 3.5–5.1)
Sodium: 136 mmol/L (ref 135–145)

## 2020-08-24 LAB — CBC
HCT: 30.3 % — ABNORMAL LOW (ref 36.0–46.0)
Hemoglobin: 9.2 g/dL — ABNORMAL LOW (ref 12.0–15.0)
MCH: 30.4 pg (ref 26.0–34.0)
MCHC: 30.4 g/dL (ref 30.0–36.0)
MCV: 100 fL (ref 80.0–100.0)
Platelets: 146 10*3/uL — ABNORMAL LOW (ref 150–400)
RBC: 3.03 MIL/uL — ABNORMAL LOW (ref 3.87–5.11)
RDW: 18 % — ABNORMAL HIGH (ref 11.5–15.5)
WBC: 7.4 10*3/uL (ref 4.0–10.5)
nRBC: 0 % (ref 0.0–0.2)

## 2020-08-24 LAB — PHOSPHORUS: Phosphorus: 3.9 mg/dL (ref 2.5–4.6)

## 2020-08-24 LAB — MAGNESIUM: Magnesium: 2.3 mg/dL (ref 1.7–2.4)

## 2020-08-25 NOTE — Progress Notes (Signed)
Pulmonary Critical Care Medicine [facility]   PULMONARY CRITICAL CARE SERVICE  PROGRESS NOTE     Rachel [name]  [id number]:[id number]  DOB: 21-Oct-1955   DOA: 07/09/2020  Referring Physician: Carron Curie, MD  HPI: Rachel [name] is a 65 y.o. female seen for follow up of Acute on Chronic Respiratory Failure.  Patient is on full support right now resting comfortably remains on the ventilator on a pressure control with 40% FiO2.  Home training underway for the family  Medications: Reviewed on Rounds  Physical Exam:  Vitals: Temperature is 97.8 pulse 88 respiratory 22 blood pressure is 117/72 saturations 95%  Ventilator Settings pressure assist control FiO2 40% IP 25 PEEP 5  . General: Comfortable at this time . Eyes: Grossly normal lids, irises & conjunctiva . ENT: grossly tongue is normal . Neck: no obvious mass . Cardiovascular: S1 S2 normal no gallop . Respiratory: Coarse breath sounds with few scattered rhonchi . Abdomen: soft . Skin: no rash seen on limited exam . Musculoskeletal: not rigid . Psychiatric:unable to assess . Neurologic: no seizure no involuntary movements         Lab Data:   Basic Metabolic Panel: Recent Labs  Lab 08/19/20 0713 08/22/20 0456 08/23/20 0356 08/23/20 0801 08/24/20 0417  NA 135 136  --   --  136  K 3.7 3.4* 5.1 3.5 3.5  CL 78* 82*  --   --  84*  CO2 49* 46*  --   --  42*  GLUCOSE 210* 170*  --   --  151*  BUN 31* 34*  --   --  31*  CREATININE 0.30* <0.30*  --   --  <0.30*  CALCIUM 8.9 8.8*  --   --  8.8*  MG 2.3 2.3  --   --  2.3  PHOS 3.5 4.3  --   --  3.9    ABG: No results for input(s): PHART, PCO2ART, PO2ART, HCO3, O2SAT in the last 168 hours.  Liver Function Tests: Recent Labs  Lab 08/22/20 0456  ALBUMIN 2.5*   No results for input(s): LIPASE, AMYLASE in the last 168 hours. No results for input(s): AMMONIA in the last 168 hours.  CBC: Recent Labs  Lab 08/19/20 0713 08/22/20 0456 08/24/20 0417   WBC 8.3 8.5 7.4  HGB 8.0* 8.4* 9.2*  HCT 26.2* 26.9* 30.3*  MCV 101.6* 99.6 100.0  PLT 157 139* 146*    Cardiac Enzymes: No results for input(s): CKTOTAL, CKMB, CKMBINDEX, TROPONINI in the last 168 hours.  BNP (last 3 results) Recent Labs    05/31/20 1252  BNP 76.0    ProBNP (last 3 results) No results for input(s): PROBNP in the last 8760 hours.  Radiological Exams: No results found.  Assessment/Plan Active Problems:   Acute on chronic respiratory failure with hypoxia (HCC)   COVID-19 virus infection   Pneumonia due to COVID-19 virus   Pneumothorax, left   Metabolic encephalopathy   1. Acute on chronic respiratory failure with hypoxia plan is going to be to continue on the full vent support as already discussed.  The patient is having home training done 2. COVID-19 virus infection recovery phase we will continue to follow along closely. 3. Pneumonia due to COVID-19 treated continue to follow. 4. Pneumothorax has resolved patient has large residual pneumatocele 5. Metabolic encephalopathy no change we will continue with supportive care   I have personally seen and evaluated the patient, evaluated laboratory and imaging results, formulated the assessment and plan  and placed orders. The Patient requires high complexity decision making with multiple systems involvement.  Rounds were done with the Respiratory Therapy Director and Staff therapists and discussed with nursing staff also.  Allyne Gee, MD [facility] Pulmonary Critical Care Medicine Sleep Medicine

## 2020-08-26 NOTE — Progress Notes (Signed)
Pulmonary Critical Care Medicine [facility]   PULMONARY CRITICAL CARE SERVICE  PROGRESS NOTE     Rachel [name]  QIH:[id number]  DOB: 14-Aug-1955   DOA: 07/09/2020  Referring Physician: Carron Curie, MD  HPI: Rachel [name] is a 65 y.o. female seen for follow up of Acute on Chronic Respiratory Failure.  Patient is on full support on pressure control mode has been on 40% FiO2 with good saturations  Medications: Reviewed on Rounds  Physical Exam:  Vitals: Temperature is 98.1 pulse 84 respiratory is 14 blood pressure 128/82 saturations 94%  Ventilator Settings currently on the ventilator in the pressure assist control mode FiO2 40% IP 25 PEEP of 5  . General: Comfortable at this time . Eyes: Grossly normal lids, irises & conjunctiva . ENT: grossly tongue is normal . Neck: no obvious mass . Cardiovascular: S1 S2 normal no gallop . Respiratory: No rhonchi very coarse breath sounds . Abdomen: soft . Skin: no rash seen on limited exam . Musculoskeletal: not rigid . Psychiatric:unable to assess . Neurologic: no seizure no involuntary movements         Lab Data:   Basic Metabolic Panel: Recent Labs  Lab 08/22/20 0456 08/23/20 0356 08/23/20 0801 08/24/20 0417  NA 136  --   --  136  K 3.4* 5.1 3.5 3.5  CL 82*  --   --  84*  CO2 46*  --   --  42*  GLUCOSE 170*  --   --  151*  BUN 34*  --   --  31*  CREATININE <0.30*  --   --  <0.30*  CALCIUM 8.8*  --   --  8.8*  MG 2.3  --   --  2.3  PHOS 4.3  --   --  3.9    ABG: No results for input(s): PHART, PCO2ART, PO2ART, HCO3, O2SAT in the last 168 hours.  Liver Function Tests: Recent Labs  Lab 08/22/20 0456  ALBUMIN 2.5*   No results for input(s): LIPASE, AMYLASE in the last 168 hours. No results for input(s): AMMONIA in the last 168 hours.  CBC: Recent Labs  Lab 08/22/20 0456 08/24/20 0417  WBC 8.5 7.4  HGB 8.4* 9.2*  HCT 26.9* 30.3*  MCV 99.6 100.0  PLT 139* 146*    Cardiac  Enzymes: No results for input(s): CKTOTAL, CKMB, CKMBINDEX, TROPONINI in the last 168 hours.  BNP (last 3 results) Recent Labs    05/31/20 1252  BNP 76.0    ProBNP (last 3 results) No results for input(s): PROBNP in the last 8760 hours.  Radiological Exams: No results found.  Assessment/Plan Active Problems:   Acute on chronic respiratory failure with hypoxia (HCC)   COVID-19 virus infection   Pneumonia due to COVID-19 virus   Pneumothorax, left   Metabolic encephalopathy   1. Acute on chronic respiratory failure with hypoxia plan is going to be to continue with full support in addition the patient is undergoing home training for the family for managing the ventilator at home. 2. COVID-19 virus infection recovery phase we will continue to monitor along closely. 3. 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 Metabolic encephalopathy she is at baseline   I have personally seen and evaluated the patient, evaluated laboratory and imaging results, formulated the assessment and plan and placed orders. The Patient requires high complexity decision making with multiple systems involvement.  Rounds were done with the Respiratory Therapy Director and  Staff therapists and discussed with nursing staff also.  Allyne Gee, MD [facility] Pulmonary Critical Care Medicine Sleep Medicine

## 2020-08-27 LAB — BASIC METABOLIC PANEL
Anion gap: 11 (ref 5–15)
BUN: 29 mg/dL — ABNORMAL HIGH (ref 8–23)
CO2: 38 mmol/L — ABNORMAL HIGH (ref 22–32)
Calcium: 8.7 mg/dL — ABNORMAL LOW (ref 8.9–10.3)
Chloride: 87 mmol/L — ABNORMAL LOW (ref 98–111)
Creatinine, Ser: 0.35 mg/dL — ABNORMAL LOW (ref 0.44–1.00)
GFR, Estimated: >60 mL/min (ref >60)
Glucose, Bld: 267 mg/dL — ABNORMAL HIGH (ref 70–99)
Potassium: 3.7 mmol/L (ref 3.5–5.1)
Sodium: 136 mmol/L (ref 135–145)

## 2020-08-27 LAB — CBC
HCT: 32.6 % — ABNORMAL LOW (ref 36.0–46.0)
Hemoglobin: 10 g/dL — ABNORMAL LOW (ref 12.0–15.0)
MCH: 31 pg (ref 26.0–34.0)
MCHC: 30.7 g/dL (ref 30.0–36.0)
MCV: 100.9 fL — ABNORMAL HIGH (ref 80.0–100.0)
Platelets: 144 10*3/uL — ABNORMAL LOW (ref 150–400)
RBC: 3.23 MIL/uL — ABNORMAL LOW (ref 3.87–5.11)
RDW: 18.1 % — ABNORMAL HIGH (ref 11.5–15.5)
WBC: 5.9 10*3/uL (ref 4.0–10.5)
nRBC: 0 % (ref 0.0–0.2)

## 2020-08-27 LAB — MAGNESIUM: Magnesium: 2.3 mg/dL (ref 1.7–2.4)

## 2020-08-27 NOTE — Progress Notes (Signed)
Pulmonary Critical Care Medicine [facility]   PULMONARY CRITICAL CARE SERVICE  PROGRESS NOTE     Rachel [name]  WUJ:[id number]  DOB: [birth year]   DOA: 07/09/2020  Referring Physician: Carron Curie, MD  HPI: Rachel [name] is a 65 y.o. female seen for follow up of Acute on Chronic Respiratory Failure.  Patient is on pressure control mode has been on 40% FiO2 home training is underway for discharge planning  Medications: Reviewed on Rounds  Physical Exam:  Vitals: Temperature is 96.8 pulse 84 respiratory rate 21 blood pressure is 111/68 saturations 93%  Ventilator Settings pressure assist control FiO2 40% IP 25 PEEP of 5  . General: Comfortable at this time . Eyes: Grossly normal lids, irises & conjunctiva . ENT: grossly tongue is normal . Neck: no obvious mass . Cardiovascular: S1 S2 normal no gallop . Respiratory: No rhonchi very coarse percent . Abdomen: soft . Skin: no rash seen on limited exam . Musculoskeletal: not rigid . Psychiatric:unable to assess . Neurologic: no seizure no involuntary movements         Lab Data:   Basic Metabolic Panel: Recent Labs  Lab 08/22/20 0456 08/23/20 0356 08/23/20 0801 08/24/20 0417 08/27/20 0428  NA 136  --   --  136 136  K 3.4* 5.1 3.5 3.5 3.7  CL 82*  --   --  84* 87*  CO2 46*  --   --  42* 38*  GLUCOSE 170*  --   --  151* 267*  BUN 34*  --   --  31* 29*  CREATININE <0.30*  --   --  <0.30* 0.35*  CALCIUM 8.8*  --   --  8.8* 8.7*  MG 2.3  --   --  2.3 2.3  PHOS 4.3  --   --  3.9  --     ABG: No results for input(s): PHART, PCO2ART, PO2ART, HCO3, O2SAT in the last 168 hours.  Liver Function Tests: Recent Labs  Lab 08/22/20 0456  ALBUMIN 2.5*   No results for input(s): LIPASE, AMYLASE in the last 168 hours. No results for input(s): AMMONIA in the last 168 hours.  CBC: Recent Labs  Lab 08/22/20 0456 08/24/20 0417 08/27/20 0428  WBC 8.5 7.4 5.9  HGB 8.4* 9.2* 10.0*  HCT 26.9* 30.3*  32.6*  MCV 99.6 100.0 100.9*  PLT 139* 146* 144*    Cardiac Enzymes: No results for input(s): CKTOTAL, CKMB, CKMBINDEX, TROPONINI in the last 168 hours.  BNP (last 3 results) Recent Labs    05/31/20 1252  BNP 76.0    ProBNP (last 3 results) No results for input(s): PROBNP in the last 8760 hours.  Radiological Exams: No results found.  Assessment/Plan Active Problems:   Acute on chronic respiratory failure with hypoxia (HCC)   COVID-19 virus infection   Pneumonia due to COVID-19 virus   Pneumothorax, left   Metabolic encephalopathy   1. Acute on chronic respiratory failure hypoxia plan continue with pressure assist control patient on 40% FiO2 IP 25 PEEP 5 home training is underway 2. COVID-19 virus infection in recovery phase we will continue to follow along 3. Pneumonia due to COVID-19 treated 4. Pneumatocele severe disease prognosis poor 5. Metabolic encephalopathy no change   I have personally seen and evaluated the patient, evaluated laboratory and imaging results, formulated the assessment and plan and placed orders. The Patient requires high complexity decision making with multiple systems involvement.  Rounds were done with the Respiratory Therapy Director and Staff  therapists and discussed with nursing staff also.  Allyne Gee, MD [facility] Pulmonary Critical Care Medicine Sleep Medicine

## 2020-08-28 NOTE — Progress Notes (Signed)
Pulmonary Critical Care Medicine [facility]   PULMONARY CRITICAL CARE SERVICE  PROGRESS NOTE     Rachel [name]  IHK:[id number]  DOB: Oct 12, 1955   DOA: 07/09/2020  Referring Physician: Carron Curie, MD  HPI: Rachel [name] is a 65 y.o. female seen for follow up of Acute on Chronic Respiratory Failure.  Patient currently is on pressure control mode has been on 40% FiO2 with an IP of 25  Medications: Reviewed on Rounds  Physical Exam:  Vitals: Temperature is 97.8 pulse 89 respiratory 19 blood pressure is 130/69 saturations 99  Ventilator Settings on pressure control FiO2 is 40% PEEP of 5 IP 25  . General: Comfortable at this time . Eyes: Grossly normal lids, irises & conjunctiva . ENT: grossly tongue is normal . Neck: no obvious mass . Cardiovascular: S1 S2 normal no gallop . Respiratory: No rhonchi very coarse breath sound . Abdomen: soft . Skin: no rash seen on limited exam . Musculoskeletal: not rigid . Psychiatric:unable to assess . Neurologic: no seizure no involuntary movements         Lab Data:   Basic Metabolic Panel: Recent Labs  Lab 08/22/20 0456 08/23/20 0356 08/23/20 0801 08/24/20 0417 08/27/20 0428  NA 136  --   --  136 136  K 3.4* 5.1 3.5 3.5 3.7  CL 82*  --   --  84* 87*  CO2 46*  --   --  42* 38*  GLUCOSE 170*  --   --  151* 267*  BUN 34*  --   --  31* 29*  CREATININE <0.30*  --   --  <0.30* 0.35*  CALCIUM 8.8*  --   --  8.8* 8.7*  MG 2.3  --   --  2.3 2.3  PHOS 4.3  --   --  3.9  --     ABG: No results for input(s): PHART, PCO2ART, PO2ART, HCO3, O2SAT in the last 168 hours.  Liver Function Tests: Recent Labs  Lab 08/22/20 0456  ALBUMIN 2.5*   No results for input(s): LIPASE, AMYLASE in the last 168 hours. No results for input(s): AMMONIA in the last 168 hours.  CBC: Recent Labs  Lab 08/22/20 0456 08/24/20 0417 08/27/20 0428  WBC 8.5 7.4 5.9  HGB 8.4* 9.2* 10.0*  HCT 26.9* 30.3* 32.6*  MCV 99.6 100.0  100.9*  PLT 139* 146* 144*    Cardiac Enzymes: No results for input(s): CKTOTAL, CKMB, CKMBINDEX, TROPONINI in the last 168 hours.  BNP (last 3 results) Recent Labs    05/31/20 1252  BNP 76.0    ProBNP (last 3 results) No results for input(s): PROBNP in the last 8760 hours.  Radiological Exams: No results found.  Assessment/Plan Active Problems:   Acute on chronic respiratory failure with hypoxia (HCC)   COVID-19 virus infection   Pneumonia due to COVID-19 virus   Pneumothorax, left   Metabolic encephalopathy   1. Acute on chronic respiratory failure with hypoxia patient continues on pressure control mode has been on 40% FiO2 with a PEEP of 5 2. COVID-19 virus infection in recovery phase we will continue to monitor 3. Pneumonia due to COVID-19 treated 4. Pneumothorax has resolved patient has severe residual pneumatocele 5. Metabolic encephalopathy no change   I have personally seen and evaluated the patient, evaluated laboratory and imaging results, formulated the assessment and plan and placed orders. The Patient requires high complexity decision making with multiple systems involvement.  Rounds were done with the Respiratory Therapy Director and  Staff therapists and discussed with nursing staff also.  Allyne Gee, MD [facility] Pulmonary Critical Care Medicine Sleep Medicine

## 2020-08-29 ENCOUNTER — Other Ambulatory Visit (HOSPITAL_COMMUNITY): Payer: Self-pay

## 2020-08-29 NOTE — Progress Notes (Signed)
Pulmonary Critical Care Medicine [facility]   PULMONARY CRITICAL CARE SERVICE  PROGRESS NOTE     Rachel [name]  WUJ:[id number]  DOB: 08/09/1955   DOA: 07/09/2020  Referring Physician: Carron Curie, MD  HPI: Rachel [name] is a 65 y.o. female seen for follow up of Acute on Chronic Respiratory Failure.  Patient is on the ventilator right now full support home training is underway  Medications: Reviewed on Rounds  Physical Exam:  Vitals: Temperature is 95.3 pulse 76 respiratory 22 blood pressure is 115/74 saturations 92%  Ventilator Settings on pressure assist control FiO2 is 40% IP 25 PEEP 5  . General: Comfortable at this time . Eyes: Grossly normal lids, irises & conjunctiva . ENT: grossly tongue is normal . Neck: no obvious mass . Cardiovascular: S1 S2 normal no gallop . Respiratory: No rhonchi very coarse breath sound . Abdomen: soft . Skin: no rash seen on limited exam . Musculoskeletal: not rigid . Psychiatric:unable to assess . Neurologic: no seizure no involuntary movements         Lab Data:   Basic Metabolic Panel: Recent Labs  Lab 08/23/20 0356 08/23/20 0801 08/24/20 0417 08/27/20 0428  NA  --   --  136 136  K 5.1 3.5 3.5 3.7  CL  --   --  84* 87*  CO2  --   --  42* 38*  GLUCOSE  --   --  151* 267*  BUN  --   --  31* 29*  CREATININE  --   --  <0.30* 0.35*  CALCIUM  --   --  8.8* 8.7*  MG  --   --  2.3 2.3  PHOS  --   --  3.9  --     ABG: No results for input(s): PHART, PCO2ART, PO2ART, HCO3, O2SAT in the last 168 hours.  Liver Function Tests: No results for input(s): AST, ALT, ALKPHOS, BILITOT, PROT, ALBUMIN in the last 168 hours. No results for input(s): LIPASE, AMYLASE in the last 168 hours. No results for input(s): AMMONIA in the last 168 hours.  CBC: Recent Labs  Lab 08/24/20 0417 08/27/20 0428  WBC 7.4 5.9  HGB 9.2* 10.0*  HCT 30.3* 32.6*  MCV 100.0 100.9*  PLT 146* 144*    Cardiac Enzymes: No results for  input(s): CKTOTAL, CKMB, CKMBINDEX, TROPONINI in the last 168 hours.  BNP (last 3 results) Recent Labs    05/31/20 1252  BNP 76.0    ProBNP (last 3 results) No results for input(s): PROBNP in the last 8760 hours.  Radiological Exams: No results found.  Assessment/Plan Active Problems:   Acute on chronic respiratory failure with hypoxia (HCC)   COVID-19 virus infection   Pneumonia due to COVID-19 virus   Pneumothorax, left   Metabolic encephalopathy   1. Acute on chronic respiratory failure with hypoxia we will continue with home training planning on discharge this week 2. COVID-19 virus infection in recovery 3. 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 Metabolic encephalopathy no change we will continue with supportive care   I have personally seen and evaluated the patient, evaluated laboratory and imaging results, formulated the assessment and plan and placed orders. The Patient requires high complexity decision making with multiple systems involvement.  Rounds were done with the Respiratory Therapy Director and Staff therapists and discussed with nursing staff also.  Yevonne Pax, MD [facility] Pulmonary Critical Care Medicine Sleep Medicine

## 2020-08-30 ENCOUNTER — Encounter (HOSPITAL_BASED_OUTPATIENT_CLINIC_OR_DEPARTMENT_OTHER): Payer: Self-pay

## 2020-08-30 DIAGNOSIS — M79604 Pain in right leg: Secondary | ICD-10-CM

## 2020-08-30 LAB — BASIC METABOLIC PANEL
Anion gap: 7 (ref 5–15)
BUN: 34 mg/dL — ABNORMAL HIGH (ref 8–23)
CO2: 41 mmol/L — ABNORMAL HIGH (ref 22–32)
Calcium: 8.5 mg/dL — ABNORMAL LOW (ref 8.9–10.3)
Chloride: 87 mmol/L — ABNORMAL LOW (ref 98–111)
Creatinine, Ser: 0.3 mg/dL — ABNORMAL LOW (ref 0.44–1.00)
GFR, Estimated: >60 mL/min (ref >60)
Glucose, Bld: 193 mg/dL — ABNORMAL HIGH (ref 70–99)
Potassium: 3.6 mmol/L (ref 3.5–5.1)
Sodium: 135 mmol/L (ref 135–145)

## 2020-08-30 LAB — CBC
HCT: 31.2 % — ABNORMAL LOW (ref 36.0–46.0)
Hemoglobin: 9.6 g/dL — ABNORMAL LOW (ref 12.0–15.0)
MCH: 30.9 pg (ref 26.0–34.0)
MCHC: 30.8 g/dL (ref 30.0–36.0)
MCV: 100.3 fL — ABNORMAL HIGH (ref 80.0–100.0)
Platelets: 129 10*3/uL — ABNORMAL LOW (ref 150–400)
RBC: 3.11 MIL/uL — ABNORMAL LOW (ref 3.87–5.11)
RDW: 17.5 % — ABNORMAL HIGH (ref 11.5–15.5)
WBC: 6 10*3/uL (ref 4.0–10.5)
nRBC: 0 % (ref 0.0–0.2)

## 2020-08-30 LAB — MAGNESIUM: Magnesium: 2.5 mg/dL — ABNORMAL HIGH (ref 1.7–2.4)

## 2020-08-30 NOTE — Progress Notes (Signed)
RLE venous duplex has been completed.  Results can be found under chart review under CV PROC. 08/30/2020 3:11 PM Danitra Payano RVT, RDMS

## 2020-08-30 NOTE — Progress Notes (Signed)
Pulmonary Critical Care Medicine [facility]   PULMONARY CRITICAL CARE SERVICE  PROGRESS NOTE     Rachel [name]  POE:[id number]  DOB: [birth year]   DOA: 07/09/2020  Referring Physician: Carron Curie, MD  HPI: Rachel [name] is a 65 y.o. female seen for follow up of Acute on Chronic Respiratory Failure.  Patient currently is on full support and pressure control mode the pneumatocele continues to show enlargement and I fear that this may eventually lead to tension pneumothorax we will see how the patient does right now her oxygen requirement is okay still on 40% FiO2.  We are trying to continue with home training family still wants to take her home  Medications: Reviewed on Rounds  Physical Exam:  Vitals: Temperature is 96.3 pulse 83 respiratory 26 blood pressure is 129/84 saturations are 92%  Ventilator Settings on pressure assist control FiO2 is 40% IP 25 PEEP 5  . General: Comfortable at this time . Eyes: Grossly normal lids, irises & conjunctiva . ENT: grossly tongue is normal . Neck: no obvious mass . Cardiovascular: S1 S2 normal no gallop . Respiratory: No rhonchi very coarse breath sounds . Abdomen: soft . Skin: no rash seen on limited exam . Musculoskeletal: not rigid . Psychiatric:unable to assess . Neurologic: no seizure no involuntary movements         Lab Data:   Basic Metabolic Panel: Recent Labs  Lab 08/24/20 0417 08/27/20 0428 08/30/20 0612  NA 136 136 135  K 3.5 3.7 3.6  CL 84* 87* 87*  CO2 42* 38* 41*  GLUCOSE 151* 267* 193*  BUN 31* 29* 34*  CREATININE <0.30* 0.35* 0.30*  CALCIUM 8.8* 8.7* 8.5*  MG 2.3 2.3 2.5*  PHOS 3.9  --   --     ABG: No results for input(s): PHART, PCO2ART, PO2ART, HCO3, O2SAT in the last 168 hours.  Liver Function Tests: No results for input(s): AST, ALT, ALKPHOS, BILITOT, PROT, ALBUMIN in the last 168 hours. No results for input(s): LIPASE, AMYLASE in the last 168 hours. No results for input(s):  AMMONIA in the last 168 hours.  CBC: Recent Labs  Lab 08/24/20 0417 08/27/20 0428 08/30/20 0612  WBC 7.4 5.9 6.0  HGB 9.2* 10.0* 9.6*  HCT 30.3* 32.6* 31.2*  MCV 100.0 100.9* 100.3*  PLT 146* 144* 129*    Cardiac Enzymes: No results for input(s): CKTOTAL, CKMB, CKMBINDEX, TROPONINI in the last 168 hours.  BNP (last 3 results) Recent Labs    05/31/20 1252  BNP 76.0    ProBNP (last 3 results) No results for input(s): PROBNP in the last 8760 hours.  Radiological Exams: DG CHEST PORT 1 VIEW  Result Date: 08/29/2020 CLINICAL DATA:  Pneumonia/pneumatocele follow-up. EXAM: PORTABLE CHEST 1 VIEW COMPARISON:  Chest x-ray dated Aug 22, 2020. FINDINGS: Unchanged tracheostomy tube. Continued leftward mediastinal shift related to large multiloculated pneumatocele in the right chest. No normal right lung parenchyma identified. Unchanged smaller pneumatoceles in the left upper and lower lobes with continued diffuse hazy opacity throughout the left lung. IMPRESSION: 1. Unchanged appearance of the chest with large multiloculated pneumatocele in the right chest and leftward mediastinal shift. No normal right lung parenchyma identified. Superimposed pneumothorax cannot be excluded by x-ray. Consider further evaluation with noncontrast chest CT. 2. Unchanged left lung pneumonia with smaller pneumatoceles in the left upper and lower lobes. Electronically Signed   By: Obie Dredge M.D.   On: 08/29/2020 10:19    Assessment/Plan Active Problems:   Acute on chronic  respiratory failure with hypoxia (HCC)   COVID-19 virus infection   Pneumonia due to COVID-19 virus   Pneumothorax, left   Metabolic encephalopathy   1. Acute on chronic respiratory failure with hypoxia continue with full supportive care.  On the ventilator patient is stable right now. 2. COVID-19 virus infection recovery phase we will continue to follow along. 3. Pneumonia due to COVID-19 has been treated 4. Metabolic  encephalopathy patient is at baseline 5. Pneumatocele continues to show enlargement unfortunately   I have personally seen and evaluated the patient, evaluated laboratory and imaging results, formulated the assessment and plan and placed orders. The Patient requires high complexity decision making with multiple systems involvement.  Rounds were done with the Respiratory Therapy Director and Staff therapists and discussed with nursing staff also.  Yevonne Pax, MD [facility] Pulmonary Critical Care Medicine Sleep Medicine

## 2020-09-01 NOTE — Progress Notes (Signed)
Pulmonary Critical Care Medicine [facility]   PULMONARY CRITICAL CARE SERVICE  PROGRESS NOTE     Rachel [name]  HCW:[id number]  DOB: 1955-09-22   DOA: 07/09/2020  Referring Physician: Carron Curie, MD  HPI: Rachel [name] is a 65 y.o. female seen for follow up of Acute on Chronic Respiratory Failure.  She is on full support right now on pressure control mode with an IP of 25.  I reviewed the tidal volumes they were in the 380 range.  Spoke with respiratory therapy to cut back to pressure on the ventilator settings to reduce risk of trauma  Medications: Reviewed on Rounds  Physical Exam:  Vitals: Temperature is 96.4 pulse 91 respiratory rate is 29 blood pressure is 125/81 saturations 96%  Ventilator Settings on pressure assist control tidal volume 380 IP 25  . General: Comfortable at this time . Eyes: Grossly normal lids, irises & conjunctiva . ENT: grossly tongue is normal . Neck: no obvious mass . Cardiovascular: S1 S2 normal no gallop . Respiratory: Scattered rhonchi expansion is equal at this time . Abdomen: soft . Skin: no rash seen on limited exam . Musculoskeletal: not rigid . Psychiatric:unable to assess . Neurologic: no seizure no involuntary movements         Lab Data:   Basic Metabolic Panel: Recent Labs  Lab 08/27/20 0428 08/30/20 0612  NA 136 135  K 3.7 3.6  CL 87* 87*  CO2 38* 41*  GLUCOSE 267* 193*  BUN 29* 34*  CREATININE 0.35* 0.30*  CALCIUM 8.7* 8.5*  MG 2.3 2.5*    ABG: No results for input(s): PHART, PCO2ART, PO2ART, HCO3, O2SAT in the last 168 hours.  Liver Function Tests: No results for input(s): AST, ALT, ALKPHOS, BILITOT, PROT, ALBUMIN in the last 168 hours. No results for input(s): LIPASE, AMYLASE in the last 168 hours. No results for input(s): AMMONIA in the last 168 hours.  CBC: Recent Labs  Lab 08/27/20 0428 08/30/20 0612  WBC 5.9 6.0  HGB 10.0* 9.6*  HCT 32.6* 31.2*  MCV 100.9* 100.3*  PLT 144*  129*    Cardiac Enzymes: No results for input(s): CKTOTAL, CKMB, CKMBINDEX, TROPONINI in the last 168 hours.  BNP (last 3 results) Recent Labs    05/31/20 1252  BNP 76.0    ProBNP (last 3 results) No results for input(s): PROBNP in the last 8760 hours.  Radiological Exams: No results found.  Assessment/Plan Active Problems:   Acute on chronic respiratory failure with hypoxia (HCC)   COVID-19 virus infection   Pneumonia due to COVID-19 virus   Pneumothorax, left   Metabolic encephalopathy   1. Acute on chronic respiratory failure hypoxia the patient will have the IP decreased to maintain volumes of about 300-3 40 2. COVID-19 virus infection recovered with severe pulmonary damage  3. Due to COVID-19 has been treated 4. Pneumatocele severe pneumatocele prognosis poor 5. Metabolic encephalopathy overall no change   I have personally seen and evaluated the patient, evaluated laboratory and imaging results, formulated the assessment and plan and placed orders. The Patient requires high complexity decision making with multiple systems involvement.  Rounds were done with the Respiratory Therapy Director and Staff therapists and discussed with nursing staff also.  Yevonne Pax, MD [facility] Pulmonary Critical Care Medicine Sleep Medicine

## 2020-09-02 LAB — PHOSPHORUS: Phosphorus: 4 mg/dL (ref 2.5–4.6)

## 2020-09-02 LAB — CBC
HCT: 36.6 % (ref 36.0–46.0)
Hemoglobin: 11 g/dL — ABNORMAL LOW (ref 12.0–15.0)
MCH: 30.1 pg (ref 26.0–34.0)
MCHC: 30.1 g/dL (ref 30.0–36.0)
MCV: 100 fL (ref 80.0–100.0)
Platelets: 155 10*3/uL (ref 150–400)
RBC: 3.66 MIL/uL — ABNORMAL LOW (ref 3.87–5.11)
RDW: 17.2 % — ABNORMAL HIGH (ref 11.5–15.5)
WBC: 10.4 10*3/uL (ref 4.0–10.5)
nRBC: 0 % (ref 0.0–0.2)

## 2020-09-02 LAB — BASIC METABOLIC PANEL
Anion gap: 12 (ref 5–15)
BUN: 22 mg/dL (ref 8–23)
CO2: 41 mmol/L — ABNORMAL HIGH (ref 22–32)
Calcium: 8.8 mg/dL — ABNORMAL LOW (ref 8.9–10.3)
Chloride: 85 mmol/L — ABNORMAL LOW (ref 98–111)
Creatinine, Ser: <0.3 mg/dL — ABNORMAL LOW (ref 0.44–1.00)
Glucose, Bld: 148 mg/dL — ABNORMAL HIGH (ref 70–99)
Potassium: 3.5 mmol/L (ref 3.5–5.1)
Sodium: 138 mmol/L (ref 135–145)

## 2020-09-02 LAB — MAGNESIUM: Magnesium: 2.4 mg/dL (ref 1.7–2.4)

## 2020-09-02 NOTE — Progress Notes (Signed)
Pulmonary Critical Care Medicine [facility]   PULMONARY CRITICAL CARE SERVICE  PROGRESS NOTE     Rachel [name]  EUM:[id number]  DOB: December 17, 1955   DOA: 07/09/2020  Referring Physician: Carron Curie, MD  HPI: Rachel [name] is a 65 y.o. female seen for follow up of Acute on Chronic Respiratory Failure.  Yesterday patient was placed on T collar for about an hour towards the end of the T collar trial she had some desaturations noted.  Medications: Reviewed on Rounds  Physical Exam:  Vitals: Temperature is 97.1 pulse 91 respiratory 24 blood pressure is 142/79 saturations 100%  Ventilator Settings on pressure assist control FiO2 40% IP 20 PEEP 5  . General: Comfortable at this time . Eyes: Grossly normal lids, irises & conjunctiva . ENT: grossly tongue is normal . Neck: no obvious mass . Cardiovascular: S1 S2 normal no gallop . Respiratory: No rhonchi no rales . Abdomen: soft . Skin: no rash seen on limited exam . Musculoskeletal: not rigid . Psychiatric:unable to assess . Neurologic: no seizure no involuntary movements         Lab Data:   Basic Metabolic Panel: Recent Labs  Lab 08/27/20 0428 08/30/20 0612 09/02/20 0803  NA 136 135 138  K 3.7 3.6 3.5  CL 87* 87* 85*  CO2 38* 41* 41*  GLUCOSE 267* 193* 148*  BUN 29* 34* 22  CREATININE 0.35* 0.30* <0.30*  CALCIUM 8.7* 8.5* 8.8*  MG 2.3 2.5* 2.4  PHOS  --   --  4.0    ABG: No results for input(s): PHART, PCO2ART, PO2ART, HCO3, O2SAT in the last 168 hours.  Liver Function Tests: No results for input(s): AST, ALT, ALKPHOS, BILITOT, PROT, ALBUMIN in the last 168 hours. No results for input(s): LIPASE, AMYLASE in the last 168 hours. No results for input(s): AMMONIA in the last 168 hours.  CBC: Recent Labs  Lab 08/27/20 0428 08/30/20 0612 09/02/20 0803  WBC 5.9 6.0 10.4  HGB 10.0* 9.6* 11.0*  HCT 32.6* 31.2* 36.6  MCV 100.9* 100.3* 100.0  PLT 144* 129* 155    Cardiac Enzymes: No  results for input(s): CKTOTAL, CKMB, CKMBINDEX, TROPONINI in the last 168 hours.  BNP (last 3 results) Recent Labs    05/31/20 1252  BNP 76.0    ProBNP (last 3 results) No results for input(s): PROBNP in the last 8760 hours.  Radiological Exams: No results found.  Assessment/Plan Active Problems:   Acute on chronic respiratory failure with hypoxia (HCC)   COVID-19 virus infection   Pneumonia due to COVID-19 virus   Pneumothorax, left   Metabolic encephalopathy   1. Acute on chronic respiratory failure hypoxia we will continue with full support on the pressure control right now.  Respiratory therapy was asked once again to try T collar today by primary team.  Oxygen requirements are of concern still however 2. COVID-19 virus infection in recovery right now 3. Pneumonia due to COVID-19 treated improved 4. 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 Metabolic encephalopathy no change   I have personally seen and evaluated the patient, evaluated laboratory and imaging results, formulated the assessment and plan and placed orders. The Patient requires high complexity decision making with multiple systems involvement.  Rounds were done with the Respiratory Therapy Director and Staff therapists and discussed with nursing staff also.  Yevonne Pax, MD [facility] Pulmonary Critical Care Medicine Sleep Medicine

## 2020-09-03 NOTE — Progress Notes (Signed)
Pulmonary Critical Care Medicine [facility]   PULMONARY CRITICAL CARE SERVICE  PROGRESS NOTE     Rachel [name]  HAL:[id number]  DOB: 04/27/1955   DOA: 07/09/2020  Referring Physician: Carron Curie, MD  HPI: Rachel [name] is a 65 y.o. female seen for follow up of Acute on Chronic Respiratory Failure.  Patient was supposed to go to hospice however into conversations are documented elsewhere this has no longer been in the plan now and instead the husband wants to take the patient home.  This morning she was started on T collar by the primary team on evaluation when I walked in she was on 80% FiO2 and her saturation was 87%.  She had significant tachycardia with a heart rate of about 1 29-1 35 and she had visible increased work of breathing noted.  Husband was present in the room I talked to him about goals of care what are the endpoints and what the patient would like.  The patient has been is not 100% on board with the spontaneous trials on T collar based on what he is telling me.  The patient herself is awake and alert and able to make her own decisions and she understands that the T collar is not going to be the endpoint and that she is going to be ventilator dependent for the remainder of her life however long that may be.  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  And the left lung is also affected however still seems to be functioning but not sufficient enough for her to come off of the ventilator for any prolonged period of time.  Also explained that doing 1 hour or 2 hours off of the ventilator on T-bar could be potentially more detrimental to her and could potentially cause a situation which could result in a coat and lead to her early demise.  Right now she does want to be a full code despite my explanation that this is more than likely going to be an exercise in futility and if she  were to code she is not likely to survive in fact there could be significant damage to her lungs as well as her skeletal system.  The husband took me aside and basically stated he understands however he does not want her to become anxious about it and he would like to take her home.  He also stated to me that in the event that she had a code at home and he had to call the fire house he would tell them not to do CPR but he does not want to have that conversation with her because he is afraid that it will only make her more anxious.  Medications: Reviewed on Rounds  Physical Exam:  Vitals: Temperature 96.1 pulse 103 respiratory rate is 28 blood pressure 112/85 saturations 100% on the ventilator  Ventilator Settings when I walked in the room she had been off the ventilator on T-bar on 80% FiO2 with saturations of 87% we immediately placed her back on the ventilator and 50% FiO2 and saturations were better in the 96% to 97% range  . General: Comfortable at this time . Eyes: Grossly normal lids, irises & conjunctiva . ENT: grossly tongue is normal . Neck: no obvious mass . Cardiovascular: S1 S2 normal no gallop . Respiratory: No rhonchi and no rales . Abdomen: soft . Skin: no rash seen on limited  exam . Musculoskeletal: not rigid . Psychiatric:unable to assess . Neurologic: no seizure no involuntary movements         Lab Data:   Basic Metabolic Panel: Recent Labs  Lab 08/30/20 0612 09/02/20 0803  NA 135 138  K 3.6 3.5  CL 87* 85*  CO2 41* 41*  GLUCOSE 193* 148*  BUN 34* 22  CREATININE 0.30* <0.30*  CALCIUM 8.5* 8.8*  MG 2.5* 2.4  PHOS  --  4.0    ABG: No results for input(s): PHART, PCO2ART, PO2ART, HCO3, O2SAT in the last 168 hours.  Liver Function Tests: No results for input(s): AST, ALT, ALKPHOS, BILITOT, PROT, ALBUMIN in the last 168 hours. No results for input(s): LIPASE, AMYLASE in the last 168 hours. No results for input(s): AMMONIA in the last 168  hours.  CBC: Recent Labs  Lab 08/30/20 0612 09/02/20 0803  WBC 6.0 10.4  HGB 9.6* 11.0*  HCT 31.2* 36.6  MCV 100.3* 100.0  PLT 129* 155    Cardiac Enzymes: No results for input(s): CKTOTAL, CKMB, CKMBINDEX, TROPONINI in the last 168 hours.  BNP (last 3 results) Recent Labs    05/31/20 1252  BNP 76.0    ProBNP (last 3 results) No results for input(s): PROBNP in the last 8760 hours.  Radiological Exams: No results found.  Assessment/Plan Active Problems:   Acute on chronic respiratory failure with hypoxia (HCC)   COVID-19 virus infection   Pneumonia due to COVID-19 virus   Pneumothorax, left   Metabolic encephalopathy   1. 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  What is clear at this point as he wants to take her home she wants to go home.  I told her that which she preferred to go to a nursing home she stated no that if she is going to die she would rather be at home with the family.  Based on this conversation I do not believe that she is being unrealistic I believe she understands that she is not going to survive this illness but that she would rather be at home and die at home then to be in a facility without her family at her bedside 2. COVID-19 virus infection in recovery with severe residual pulmonary damage 3. Pneumatocele as discussed above severe terminal pulmonary damage 4. Metabolic encephalopathy she is of clear mind and is able to make her own decisions at this time   I have personally seen and evaluated the patient, evaluated laboratory and imaging results, formulated the assessment and plan and placed orders. The Patient requires high complexity decision making with multiple systems involvement.  Rounds were done with the Respiratory Therapy  Director and Staff therapists and discussed with nursing staff also.  Yevonne Pax, MD [facility] Pulmonary Critical Care Medicine Sleep Medicine

## 2020-09-04 NOTE — Progress Notes (Signed)
Pulmonary Critical Care Medicine [facility]   PULMONARY CRITICAL CARE SERVICE  PROGRESS NOTE     Rachel [name]  VFI:[id number]  DOB: 1955/12/04   DOA: 07/09/2020  Referring Physician: Carron Curie, MD  HPI: Rachel [name] is a 65 y.o. female seen for follow up of Acute on Chronic Respiratory Failure.  She remains on the ventilator on full support at home training still underway and also working with possibly a home health agency for discharge planning  Medications: Reviewed on Rounds  Physical Exam:  Vitals: Temperature is 97.9 pulse 95 respiratory 18 blood pressure is 98/43 saturations 95%  Ventilator Settings on pressure assist control FiO2 is 40% PEEP 5 IP 23 tidal volume 330  . General: Comfortable at this time . Eyes: Grossly normal lids, irises & conjunctiva . ENT: grossly tongue is normal . Neck: no obvious mass . Cardiovascular: S1 S2 normal no gallop . Respiratory: No rhonchi very coarse breath sounds . Abdomen: soft . Skin: no rash seen on limited exam . Musculoskeletal: not rigid . Psychiatric:unable to assess . Neurologic: no seizure no involuntary movements         Lab Data:   Basic Metabolic Panel: Recent Labs  Lab 08/30/20 0612 09/02/20 0803  NA 135 138  K 3.6 3.5  CL 87* 85*  CO2 41* 41*  GLUCOSE 193* 148*  BUN 34* 22  CREATININE 0.30* <0.30*  CALCIUM 8.5* 8.8*  MG 2.5* 2.4  PHOS  --  4.0    ABG: No results for input(s): PHART, PCO2ART, PO2ART, HCO3, O2SAT in the last 168 hours.  Liver Function Tests: No results for input(s): AST, ALT, ALKPHOS, BILITOT, PROT, ALBUMIN in the last 168 hours. No results for input(s): LIPASE, AMYLASE in the last 168 hours. No results for input(s): AMMONIA in the last 168 hours.  CBC: Recent Labs  Lab 08/30/20 0612 09/02/20 0803  WBC 6.0 10.4  HGB 9.6* 11.0*  HCT 31.2* 36.6  MCV 100.3* 100.0  PLT 129* 155    Cardiac Enzymes: No results for input(s): CKTOTAL, CKMB, CKMBINDEX,  TROPONINI in the last 168 hours.  BNP (last 3 results) Recent Labs    05/31/20 1252  BNP 76.0    ProBNP (last 3 results) No results for input(s): PROBNP in the last 8760 hours.  Radiological Exams: No results found.  Assessment/Plan Active Problems:   Acute on chronic respiratory failure with hypoxia (HCC)   COVID-19 virus infection   Pneumonia due to COVID-19 virus   Pneumothorax, left   Metabolic encephalopathy   1. Acute on chronic respiratory failure with hypoxia patient currently is on the ventilator and full support home training is underway. 2. COVID-19 virus infection in recovery phase 3. Pneumonia due to COVID-19 treated slow improvement 4. Pneumothorax no change 5. Metabolic encephalopathy patient is at baseline   I have personally seen and evaluated the patient, evaluated laboratory and imaging results, formulated the assessment and plan and placed orders. The Patient requires high complexity decision making with multiple systems involvement.  Rounds were done with the Respiratory Therapy Director and Staff therapists and discussed with nursing staff also.  Yevonne Pax, MD [facility] Pulmonary Critical Care Medicine Sleep Medicine

## 2020-09-05 LAB — CBC
HCT: 34.4 % — ABNORMAL LOW (ref 36.0–46.0)
Hemoglobin: 10.5 g/dL — ABNORMAL LOW (ref 12.0–15.0)
MCH: 30.6 pg (ref 26.0–34.0)
MCHC: 30.5 g/dL (ref 30.0–36.0)
MCV: 100.3 fL — ABNORMAL HIGH (ref 80.0–100.0)
Platelets: UNDETERMINED 10*3/uL (ref 150–400)
RBC: 3.43 MIL/uL — ABNORMAL LOW (ref 3.87–5.11)
RDW: 16.6 % — ABNORMAL HIGH (ref 11.5–15.5)
WBC: 7.6 10*3/uL (ref 4.0–10.5)
nRBC: 0 % (ref 0.0–0.2)

## 2020-09-05 LAB — BASIC METABOLIC PANEL
Anion gap: 9 (ref 5–15)
BUN: 48 mg/dL — ABNORMAL HIGH (ref 8–23)
CO2: 43 mmol/L — ABNORMAL HIGH (ref 22–32)
Calcium: 8.9 mg/dL (ref 8.9–10.3)
Chloride: 85 mmol/L — ABNORMAL LOW (ref 98–111)
Creatinine, Ser: <0.3 mg/dL — ABNORMAL LOW (ref 0.44–1.00)
Glucose, Bld: 210 mg/dL — ABNORMAL HIGH (ref 70–99)
Potassium: 3.6 mmol/L (ref 3.5–5.1)
Sodium: 137 mmol/L (ref 135–145)

## 2020-09-05 LAB — MAGNESIUM: Magnesium: 2.5 mg/dL — ABNORMAL HIGH (ref 1.7–2.4)

## 2020-09-05 LAB — PHOSPHORUS: Phosphorus: 4.2 mg/dL (ref 2.5–4.6)

## 2020-09-05 NOTE — Progress Notes (Signed)
Pulmonary Critical Care Medicine [facility]   PULMONARY CRITICAL CARE SERVICE  PROGRESS NOTE     Rachel [name]  [id number]  DOB: 11-02-1955   DOA: 07/09/2020  Referring Physician: Carron Curie, MD  HPI: Rachel [name] is a 65 y.o. female seen for follow up of Acute on Chronic Respiratory Failure.  Patient is resting comfortably on the ventilator.  She is actually on 40% FiO2 and pressure control mode and has been tolerating that well.  She is failed long-term attempts at weaning on T-bar  Medications: Reviewed on Rounds  Physical Exam:  Vitals: Temperature 96.9 pulse 83 respiratory 22 blood pressure is 95/57 saturations 95%  Ventilator Settings pressure assist control FiO2 40% IP 18 PEEP 5  . General: Comfortable at this time . Eyes: Grossly normal lids, irises & conjunctiva . ENT: grossly tongue is normal . Neck: no obvious mass . Cardiovascular: S1 S2 normal no gallop . Respiratory: No rhonchi no rales are noted at this time . Abdomen: soft . Skin: no rash seen on limited exam . Musculoskeletal: not rigid . Psychiatric:unable to assess . Neurologic: no seizure no involuntary movements         Lab Data:   Basic Metabolic Panel: Recent Labs  Lab 08/30/20 0612 09/02/20 0803 09/05/20 0416  NA 135 138 137  K 3.6 3.5 3.6  CL 87* 85* 85*  CO2 41* 41* 43*  GLUCOSE 193* 148* 210*  BUN 34* 22 48*  CREATININE 0.30* <0.30* <0.30*  CALCIUM 8.5* 8.8* 8.9  MG 2.5* 2.4 2.5*  PHOS  --  4.0 4.2    ABG: No results for input(s): PHART, PCO2ART, PO2ART, HCO3, O2SAT in the last 168 hours.  Liver Function Tests: No results for input(s): AST, ALT, ALKPHOS, BILITOT, PROT, ALBUMIN in the last 168 hours. No results for input(s): LIPASE, AMYLASE in the last 168 hours. No results for input(s): AMMONIA in the last 168 hours.  CBC: Recent Labs  Lab 08/30/20 0612 09/02/20 0803 09/05/20 0416  WBC 6.0 10.4 7.6  HGB 9.6* 11.0* 10.5*  HCT 31.2* 36.6  34.4*  MCV 100.3* 100.0 100.3*  PLT 129* 155 PLATELET CLUMPS NOTED ON SMEAR, UNABLE TO ESTIMATE    Cardiac Enzymes: No results for input(s): CKTOTAL, CKMB, CKMBINDEX, TROPONINI in the last 168 hours.  BNP (last 3 results) Recent Labs    05/31/20 1252  BNP 76.0    ProBNP (last 3 results) No results for input(s): PROBNP in the last 8760 hours.  Radiological Exams: No results found.  Assessment/Plan Active Problems:   Acute on chronic respiratory failure with hypoxia (HCC)   COVID-19 virus infection   Pneumonia due to COVID-19 virus   Pneumothorax, left   Metabolic encephalopathy   1. Acute on chronic respiratory failure hypoxia we will continue with full support on the ventilator.  Not a candidate for weaning patient discharge planning was underway for home with healthcare 2. COVID-19 virus infection in recovery 3. Pneumonia due to COVID-19 with severe residual pulmonary damage 4. Pneumothorax pneumatocele patient has essentially transformed the right lung into an entire pneumatocele with COVID damage 5. Metabolic encephalopathy she is awake and alert appropriate able to make decisions   I have personally seen and evaluated the patient, evaluated laboratory and imaging results, formulated the assessment and plan and placed orders. The Patient requires high complexity decision making with multiple systems involvement.  Rounds were done with the Respiratory Therapy Director and Staff therapists and discussed with nursing staff also.  Kalicia Dufresne A  Humphrey Rolls, MD [facility] Pulmonary Critical Care Medicine Sleep Medicine

## 2020-09-08 NOTE — Progress Notes (Signed)
Pulmonary Critical Care Medicine [facility]   PULMONARY CRITICAL CARE SERVICE  PROGRESS NOTE     Rachel [name]  [id number]  DOB: 03-17-1956   DOA: 07/09/2020  Referring Physician: Carron Curie, MD  HPI: Rachel [name] is a 65 y.o. female seen for follow up of Acute on Chronic Respiratory Failure.  Patient right now is resting comfortably without any distress at this time.  She is on full support on pressure control mode still requiring 40% FiO2.  Medications: Reviewed on Rounds  Physical Exam:  Vitals: Temperature is 97.2 pulse 77 respiratory rate is 24 blood pressure is 93/56 saturations 96%  Ventilator Settings on pressure assist control FiO2 40% pressure of 23 FiO2 40%  . General: Comfortable at this time . Eyes: Grossly normal lids, irises & conjunctiva . ENT: grossly tongue is normal . Neck: no obvious mass . Cardiovascular: S1 S2 normal no gallop . Respiratory: No rhonchi very coarse breath sounds . Abdomen: soft . Skin: no rash seen on limited exam . Musculoskeletal: not rigid . Psychiatric:unable to assess . Neurologic: no seizure no involuntary movements         Lab Data:   Basic Metabolic Panel: Recent Labs  Lab 09/02/20 0803 09/05/20 0416  NA 138 137  K 3.5 3.6  CL 85* 85*  CO2 41* 43*  GLUCOSE 148* 210*  BUN 22 48*  CREATININE <0.30* <0.30*  CALCIUM 8.8* 8.9  MG 2.4 2.5*  PHOS 4.0 4.2    ABG: No results for input(s): PHART, PCO2ART, PO2ART, HCO3, O2SAT in the last 168 hours.  Liver Function Tests: No results for input(s): AST, ALT, ALKPHOS, BILITOT, PROT, ALBUMIN in the last 168 hours. No results for input(s): LIPASE, AMYLASE in the last 168 hours. No results for input(s): AMMONIA in the last 168 hours.  CBC: Recent Labs  Lab 09/02/20 0803 09/05/20 0416  WBC 10.4 7.6  HGB 11.0* 10.5*  HCT 36.6 34.4*  MCV 100.0 100.3*  PLT 155 PLATELET CLUMPS NOTED ON SMEAR, UNABLE TO ESTIMATE    Cardiac Enzymes: No  results for input(s): CKTOTAL, CKMB, CKMBINDEX, TROPONINI in the last 168 hours.  BNP (last 3 results) Recent Labs    05/31/20 1252  BNP 76.0    ProBNP (last 3 results) No results for input(s): PROBNP in the last 8760 hours.  Radiological Exams: No results found.  Assessment/Plan Active Problems:   Acute on chronic respiratory failure with hypoxia (HCC)   COVID-19 virus infection   Pneumonia due to COVID-19 virus   Pneumothorax, left   Metabolic encephalopathy   1. Acute on chronic respiratory failure with hypoxia plan is going to be to continue with full support on the ventilator.  Right now patient is on 40% FiO2.  She is failed numerous attempts at trying to wean the on T-bar. 2. COVID-19 virus infection in recovery phase 3. Pneumonia due to COVID-19 severe pulmonary disease 4. Pneumothorax left side has resolved patient has a large pneumatocele on the other side. 5. Metabolic encephalopathy no change we will continue to follow along.   I have personally seen and evaluated the patient, evaluated laboratory and imaging results, formulated the assessment and plan and placed orders. The Patient requires high complexity decision making with multiple systems involvement.  Rounds were done with the Respiratory Therapy Director and Staff therapists and discussed with nursing staff also.  Yevonne Pax, MD [facility] Pulmonary Critical Care Medicine Sleep Medicine

## 2020-09-09 LAB — CBC
HCT: 30.5 % — ABNORMAL LOW (ref 36.0–46.0)
Hemoglobin: 9.4 g/dL — ABNORMAL LOW (ref 12.0–15.0)
MCH: 30.2 pg (ref 26.0–34.0)
MCHC: 30.8 g/dL (ref 30.0–36.0)
MCV: 98.1 fL (ref 80.0–100.0)
Platelets: UNDETERMINED 10*3/uL (ref 150–400)
RBC: 3.11 MIL/uL — ABNORMAL LOW (ref 3.87–5.11)
RDW: 15.9 % — ABNORMAL HIGH (ref 11.5–15.5)
WBC: 7.1 10*3/uL (ref 4.0–10.5)
nRBC: 0.7 % — ABNORMAL HIGH (ref 0.0–0.2)

## 2020-09-09 LAB — BASIC METABOLIC PANEL
Anion gap: 8 (ref 5–15)
BUN: 23 mg/dL (ref 8–23)
CO2: 43 mmol/L — ABNORMAL HIGH (ref 22–32)
Calcium: 8.5 mg/dL — ABNORMAL LOW (ref 8.9–10.3)
Chloride: 86 mmol/L — ABNORMAL LOW (ref 98–111)
Creatinine, Ser: 0.3 mg/dL — ABNORMAL LOW (ref 0.44–1.00)
GFR, Estimated: >60 mL/min (ref >60)
Glucose, Bld: 167 mg/dL — ABNORMAL HIGH (ref 70–99)
Potassium: 3.2 mmol/L — ABNORMAL LOW (ref 3.5–5.1)
Sodium: 137 mmol/L (ref 135–145)

## 2020-09-09 LAB — MAGNESIUM: Magnesium: 2.4 mg/dL (ref 1.7–2.4)

## 2020-09-09 LAB — PHOSPHORUS: Phosphorus: 3.5 mg/dL (ref 2.5–4.6)

## 2020-09-09 NOTE — Progress Notes (Signed)
Pulmonary Critical Care Medicine [facility]   PULMONARY CRITICAL CARE SERVICE  PROGRESS NOTE     Rachel [name]  [id number]  DOB: 10/18/1955   DOA: 07/09/2020  Referring Physician: Carron Curie, MD  HPI: Rachel [name] is a 65 y.o. female seen for follow up of Acute on Chronic Respiratory Failure.  Patient is pressure control mode she remains comfortable  Medications: Reviewed on Rounds  Physical Exam:  Vitals: Temperature is 96.5 pulse 72 respiratory 25 blood pressure is 133/79 saturations 95%  Ventilator Settings on pressure assist control FiO2 30% IP 23 PEEP 5  . General: Comfortable at this time . Eyes: Grossly normal lids, irises & conjunctiva . ENT: grossly tongue is normal . Neck: no obvious mass . Cardiovascular: S1 S2 normal no gallop . Respiratory: Scattered rhonchi expansion is equal . Abdomen: soft . Skin: no rash seen on limited exam . Musculoskeletal: not rigid . Psychiatric:unable to assess . Neurologic: no seizure no involuntary movements         Lab Data:   Basic Metabolic Panel: Recent Labs  Lab 09/05/20 0416 09/09/20 0234  NA 137 137  K 3.6 3.2*  CL 85* 86*  CO2 43* 43*  GLUCOSE 210* 167*  BUN 48* 23  CREATININE <0.30* 0.30*  CALCIUM 8.9 8.5*  MG 2.5* 2.4  PHOS 4.2 3.5    ABG: No results for input(s): PHART, PCO2ART, PO2ART, HCO3, O2SAT in the last 168 hours.  Liver Function Tests: No results for input(s): AST, ALT, ALKPHOS, BILITOT, PROT, ALBUMIN in the last 168 hours. No results for input(s): LIPASE, AMYLASE in the last 168 hours. No results for input(s): AMMONIA in the last 168 hours.  CBC: Recent Labs  Lab 09/05/20 0416 09/09/20 0234  WBC 7.6 7.1  HGB 10.5* 9.4*  HCT 34.4* 30.5*  MCV 100.3* 98.1  PLT PLATELET CLUMPS NOTED ON SMEAR, UNABLE TO ESTIMATE PLATELET CLUMPS NOTED ON SMEAR, UNABLE TO ESTIMATE    Cardiac Enzymes: No results for input(s): CKTOTAL, CKMB, CKMBINDEX, TROPONINI in the last 168  hours.  BNP (last 3 results) Recent Labs    05/31/20 1252  BNP 76.0    ProBNP (last 3 results) No results for input(s): PROBNP in the last 8760 hours.  Radiological Exams: No results found.  Assessment/Plan Active Problems:   Acute on chronic respiratory failure with hypoxia (HCC)   COVID-19 virus infection   Pneumonia due to COVID-19 virus   Pneumothorax, left   Metabolic encephalopathy   1. Acute on chronic respiratory failure hypoxia on the vent full support we will continue to monitor closely. 2. COVID-19 virus infection recovery we will continue with supportive care. 3. COVID-19 pneumonia in resolution 4. Pneumothorax continue present therapy 5. Pneumothorax resolved patient has residual large pneumatocele. 6. Metabolic encephalopathy at baseline she is awake and alert   I have personally seen and evaluated the patient, evaluated laboratory and imaging results, formulated the assessment and plan and placed orders. The Patient requires high complexity decision making with multiple systems involvement.  Rounds were done with the Respiratory Therapy Director and Staff therapists and discussed with nursing staff also.  Yevonne Pax, MD [facility] Pulmonary Critical Care Medicine Sleep Medicine

## 2020-09-10 LAB — BASIC METABOLIC PANEL
Anion gap: 9 (ref 5–15)
BUN: 20 mg/dL (ref 8–23)
CO2: 45 mmol/L — ABNORMAL HIGH (ref 22–32)
Calcium: 8.9 mg/dL (ref 8.9–10.3)
Chloride: 82 mmol/L — ABNORMAL LOW (ref 98–111)
Creatinine, Ser: <0.3 mg/dL — ABNORMAL LOW (ref 0.44–1.00)
Glucose, Bld: 153 mg/dL — ABNORMAL HIGH (ref 70–99)
Potassium: 3.8 mmol/L (ref 3.5–5.1)
Sodium: 136 mmol/L (ref 135–145)

## 2020-09-10 LAB — MAGNESIUM: Magnesium: 2.6 mg/dL — ABNORMAL HIGH (ref 1.7–2.4)

## 2020-09-10 NOTE — Progress Notes (Signed)
Pulmonary Critical Care Medicine [facility]   PULMONARY CRITICAL CARE SERVICE  PROGRESS NOTE     Rachel [name]  [id number]  DOB: [birth year]   DOA: 07/09/2020  Referring Physician: Carron Curie, MD  HPI: Rachel [name] is a 65 y.o. female seen for follow up of Acute on Chronic Respiratory Failure.  Patient is currently on pressure control mode has been on 40% FiO2  Medications: Reviewed on Rounds  Physical Exam:  Vitals: Temperature is 98.0 pulse 99 respiratory 30 blood pressure is 170/95 saturations 92%  Ventilator Settings on pressure assist control FiO2 40% PEEP 5 IP 23  . General: Comfortable at this time . Eyes: Grossly normal lids, irises & conjunctiva . ENT: grossly tongue is normal . Neck: no obvious mass . Cardiovascular: S1 S2 normal no gallop . Respiratory: No rhonchi very coarse breath sounds . Abdomen: soft . Skin: no rash seen on limited exam . Musculoskeletal: not rigid . Psychiatric:unable to assess . Neurologic: no seizure no involuntary movements         Lab Data:   Basic Metabolic Panel: Recent Labs  Lab 09/05/20 0416 09/09/20 0234  NA 137 137  K 3.6 3.2*  CL 85* 86*  CO2 43* 43*  GLUCOSE 210* 167*  BUN 48* 23  CREATININE <0.30* 0.30*  CALCIUM 8.9 8.5*  MG 2.5* 2.4  PHOS 4.2 3.5    ABG: No results for input(s): PHART, PCO2ART, PO2ART, HCO3, O2SAT in the last 168 hours.  Liver Function Tests: No results for input(s): AST, ALT, ALKPHOS, BILITOT, PROT, ALBUMIN in the last 168 hours. No results for input(s): LIPASE, AMYLASE in the last 168 hours. No results for input(s): AMMONIA in the last 168 hours.  CBC: Recent Labs  Lab 09/05/20 0416 09/09/20 0234  WBC 7.6 7.1  HGB 10.5* 9.4*  HCT 34.4* 30.5*  MCV 100.3* 98.1  PLT PLATELET CLUMPS NOTED ON SMEAR, UNABLE TO ESTIMATE PLATELET CLUMPS NOTED ON SMEAR, UNABLE TO ESTIMATE    Cardiac Enzymes: No results for input(s): CKTOTAL, CKMB, CKMBINDEX, TROPONINI in  the last 168 hours.  BNP (last 3 results) Recent Labs    05/31/20 1252  BNP 76.0    ProBNP (last 3 results) No results for input(s): PROBNP in the last 8760 hours.  Radiological Exams: No results found.  Assessment/Plan Active Problems:   Acute on chronic respiratory failure with hypoxia (HCC)   COVID-19 virus infection   Pneumonia due to COVID-19 virus   Pneumothorax, left   Metabolic encephalopathy   1. Acute on chronic respiratory failure hypoxia patient's home ventilator is supposed to come in today and the family will begin with a home ventilator training 2. COVID-19 virus infection in recovery 3. Pneumonia due to COVID-19 with severe residual pulmonary damage 4. Metabolic encephalopathy no change we will continue to monitor closely 5. Pneumothorax has resolved patient with now huge pneumatocele which has been progressive since her admission   I have personally seen and evaluated the patient, evaluated laboratory and imaging results, formulated the assessment and plan and placed orders. The Patient requires high complexity decision making with multiple systems involvement.  Rounds were done with the Respiratory Therapy Director and Staff therapists and discussed with nursing staff also.  Yevonne Pax, MD [facility] Pulmonary Critical Care Medicine Sleep Medicine

## 2020-09-11 NOTE — Progress Notes (Signed)
Pulmonary Critical Care Medicine [facility]   PULMONARY CRITICAL CARE SERVICE  PROGRESS NOTE     Rachel [name]  [id number]  DOB: 1955-06-08   DOA: 07/09/2020  Referring Physician: Carron Curie, MD  HPI: Rachel [name] is a 65 y.o. female seen for follow up of Acute on Chronic Respiratory Failure.  Patient at this time is on her home ventilator she is awake and alert and is in good spirits.  Patient's daughter was present in the room and she was also updated.  Medications: Reviewed on Rounds  Physical Exam:  Vitals: Temperature is 96.8 pulse 83 respiratory 30 blood pressure is 98/53 saturations 95%  Ventilator Settings on home ventilator with 8 L oxygen flow  . General: Comfortable at this time . Eyes: Grossly normal lids, irises & conjunctiva . ENT: grossly tongue is normal . Neck: no obvious mass . Cardiovascular: S1 S2 normal no gallop . Respiratory: Scattered rhonchi expansion is equal . Abdomen: soft . Skin: no rash seen on limited exam . Musculoskeletal: not rigid . Psychiatric:unable to assess . Neurologic: no seizure no involuntary movements         Lab Data:   Basic Metabolic Panel: Recent Labs  Lab 09/05/20 0416 09/09/20 0234 09/10/20 1104  NA 137 137 136  K 3.6 3.2* 3.8  CL 85* 86* 82*  CO2 43* 43* 45*  GLUCOSE 210* 167* 153*  BUN 48* 23 20  CREATININE <0.30* 0.30* <0.30*  CALCIUM 8.9 8.5* 8.9  MG 2.5* 2.4 2.6*  PHOS 4.2 3.5  --     ABG: No results for input(s): PHART, PCO2ART, PO2ART, HCO3, O2SAT in the last 168 hours.  Liver Function Tests: No results for input(s): AST, ALT, ALKPHOS, BILITOT, PROT, ALBUMIN in the last 168 hours. No results for input(s): LIPASE, AMYLASE in the last 168 hours. No results for input(s): AMMONIA in the last 168 hours.  CBC: Recent Labs  Lab 09/05/20 0416 09/09/20 0234  WBC 7.6 7.1  HGB 10.5* 9.4*  HCT 34.4* 30.5*  MCV 100.3* 98.1  PLT PLATELET CLUMPS NOTED ON SMEAR, UNABLE TO  ESTIMATE PLATELET CLUMPS NOTED ON SMEAR, UNABLE TO ESTIMATE    Cardiac Enzymes: No results for input(s): CKTOTAL, CKMB, CKMBINDEX, TROPONINI in the last 168 hours.  BNP (last 3 results) Recent Labs    05/31/20 1252  BNP 76.0    ProBNP (last 3 results) No results for input(s): PROBNP in the last 8760 hours.  Radiological Exams: No results found.  Assessment/Plan Active Problems:   Acute on chronic respiratory failure with hypoxia (HCC)   COVID-19 virus infection   Pneumonia due to COVID-19 virus   Pneumothorax, left   Metabolic encephalopathy   1. Acute on chronic respiratory failure with hypoxia plan is to continue with home training as ordered continue secretion management supportive care 2. COVID-19 virus infection recovery 3. Pneumonia due to COVID-19 has been treated we will continue to monitor 4. Pneumatocele large residual pneumatocele secondary to COVID-19 5. Encephalopathy has resolved she is awake and alert   I have personally seen and evaluated the patient, evaluated laboratory and imaging results, formulated the assessment and plan and placed orders. The Patient requires high complexity decision making with multiple systems involvement.  Rounds were done with the Respiratory Therapy Director and Staff therapists and discussed with nursing staff also.  Yevonne Pax, MD [facility] Pulmonary Critical Care Medicine Sleep Medicine

## 2020-10-11 DEATH — deceased

## 2022-12-13 IMAGING — CT CT ANGIO CHEST
2 of 6 series · 19 of 46 positions shown · IV contrast (omnipaque)
Comparison: None.

CLINICAL DATA: Shortness of breath.  [id number] positive.

EXAM:
CT ANGIOGRAPHY CHEST WITH CONTRAST
TECHNIQUE: Multidetector CT imaging of the chest was performed using the
standard protocol during bolus administration of intravenous
contrast. Multiplanar CT image reconstructions and MIPs were
obtained to evaluate the vascular anatomy.
CONTRAST:  75mL OMNIPAQUE IOHEXOL 350 MG/ML SOLN

[Series 5: pe axial thins · axial · 0.62mm/px · z∈[-236,-12]mm · 16 of 246 slices shown]
[im 11/246  lung]
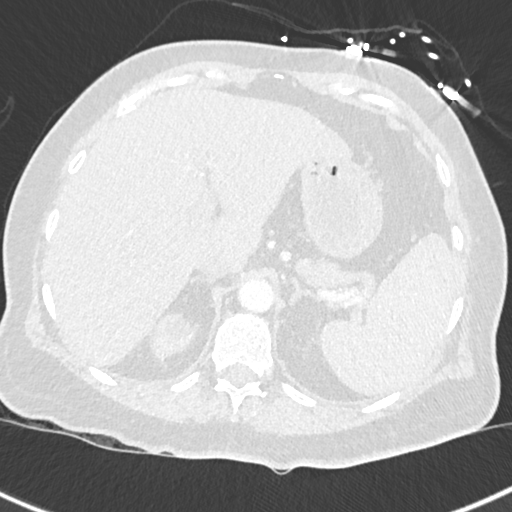
[im 32/246  soft-tissue]
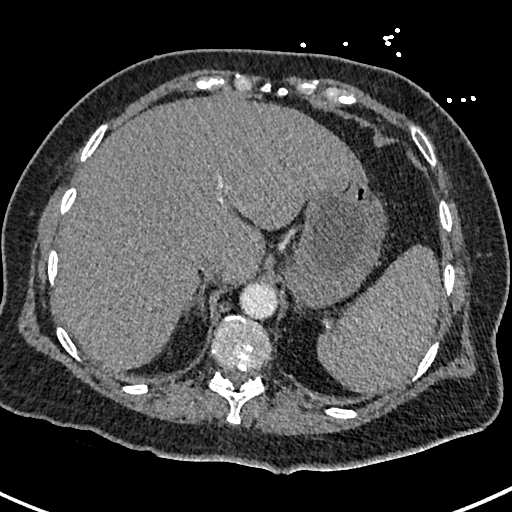
[im 43/246  lung]
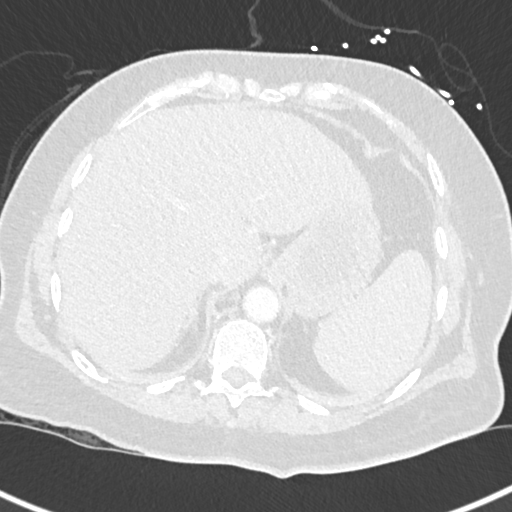
[im 54/246  soft-tissue]
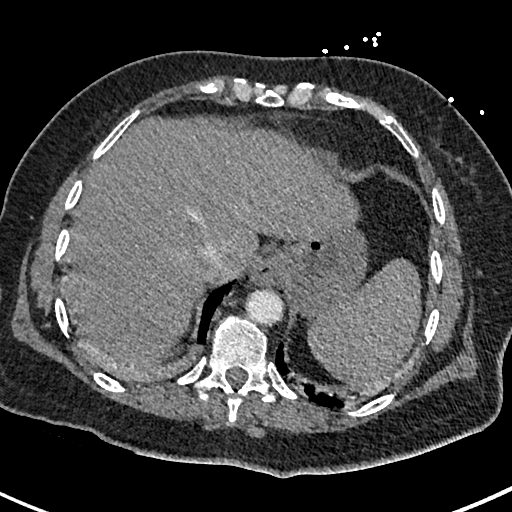
[im 75/246  lung]
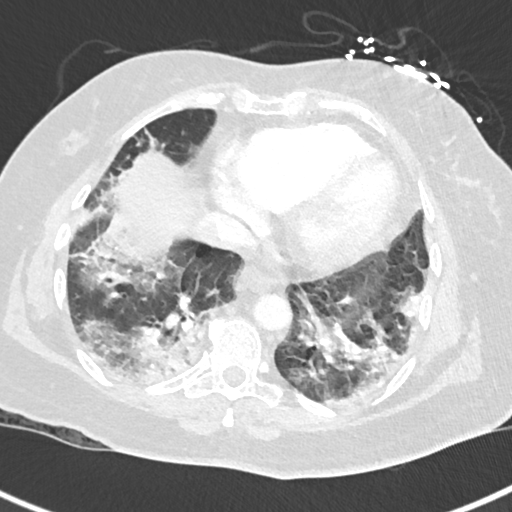
[im 86/246  soft-tissue]
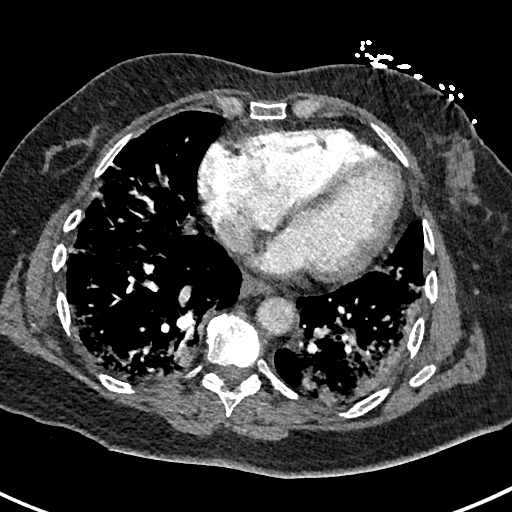
[im 96/246  lung]
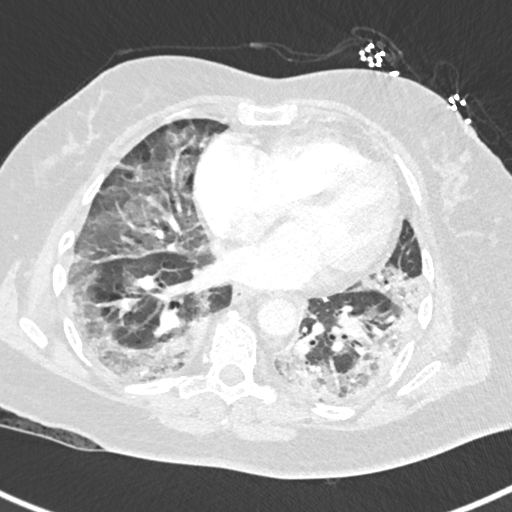
[im 118/246  soft-tissue]
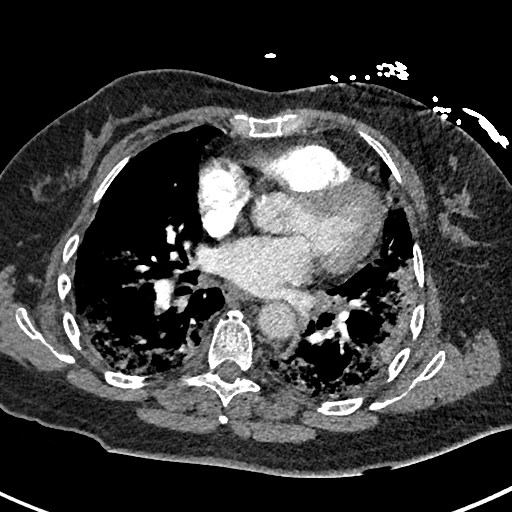
[im 128/246  lung]
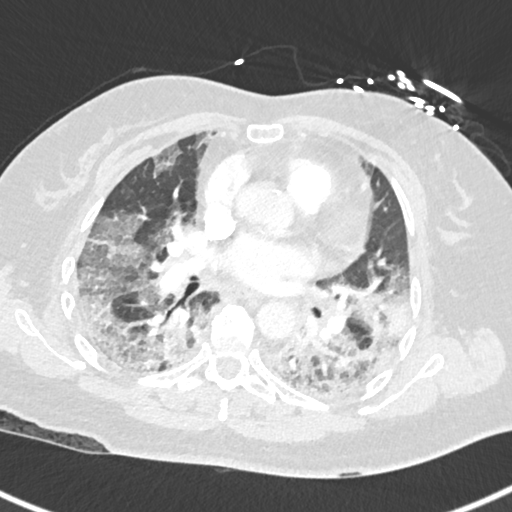
[im 150/246  soft-tissue]
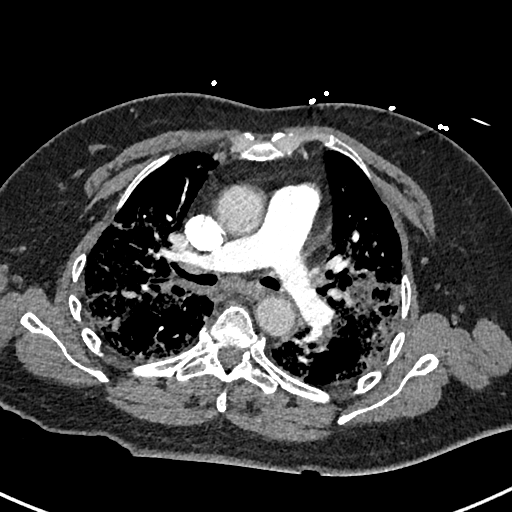
[im 160/246  lung]
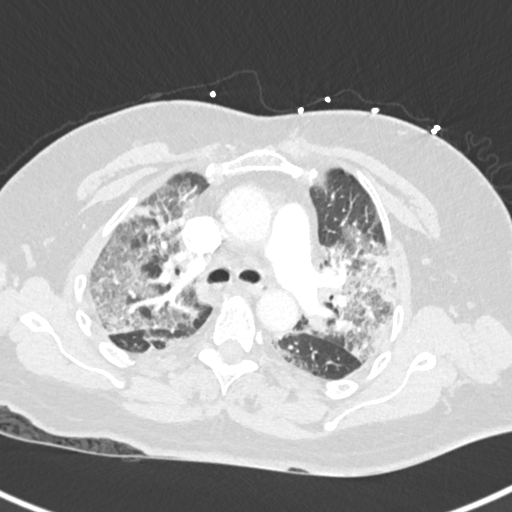
[im 171/246  soft-tissue]
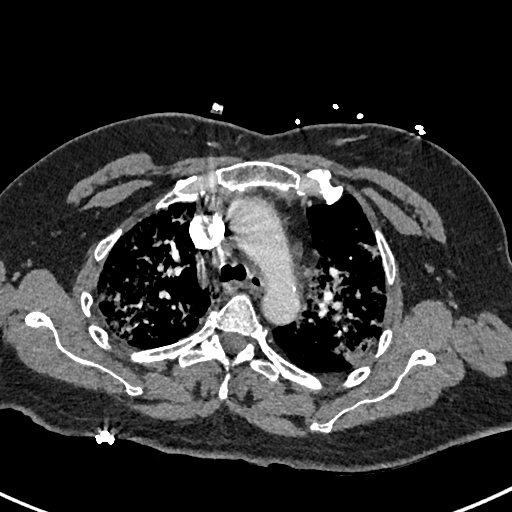
[im 192/246  lung]
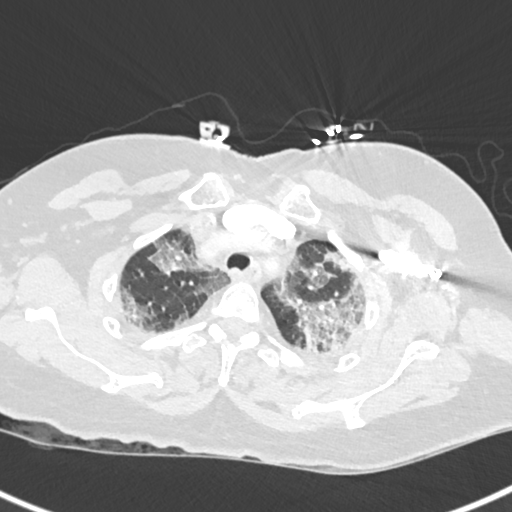
[im 203/246  soft-tissue]
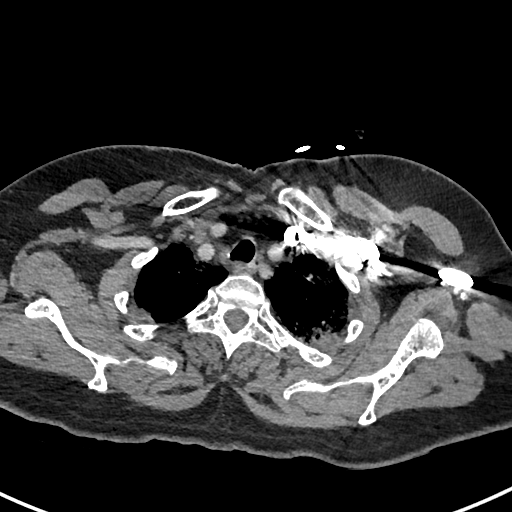
[im 214/246  lung]
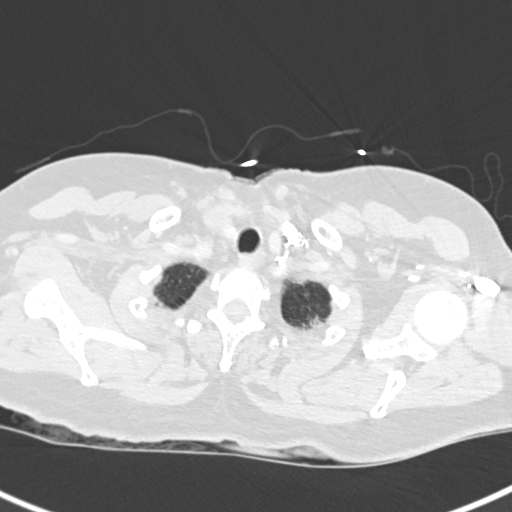
[im 235/246  soft-tissue]
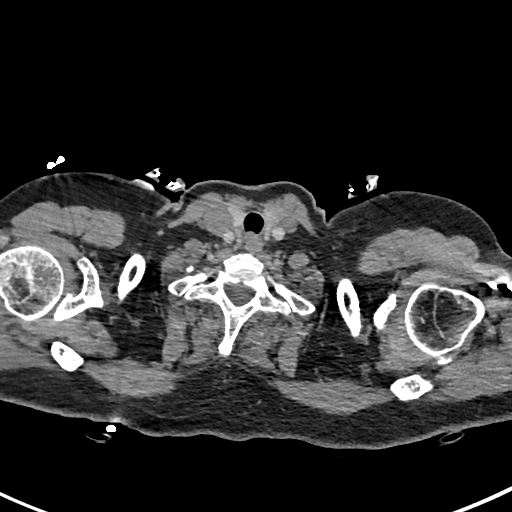

[Series 8: cor soft · coronal · 0.52mm/px · 3 of 139 slices shown]
[im 35/139  soft-tissue]
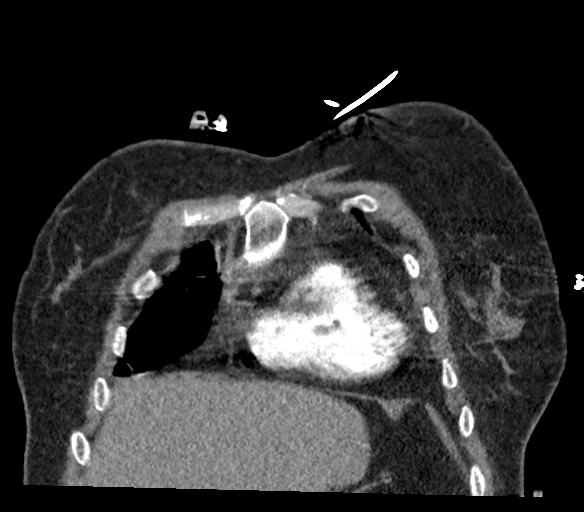
[im 70/139  soft-tissue]
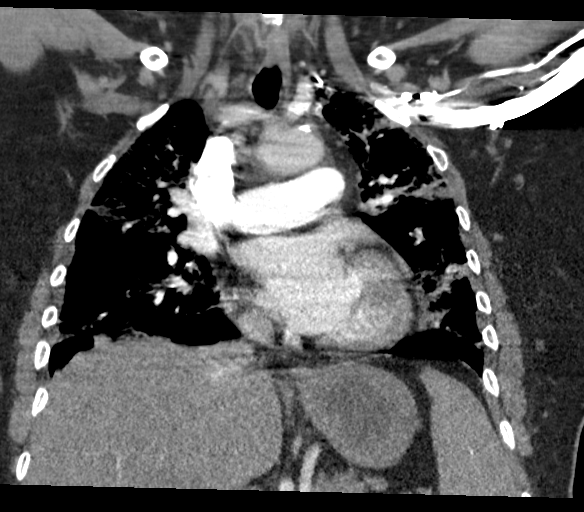
[im 104/139  soft-tissue]
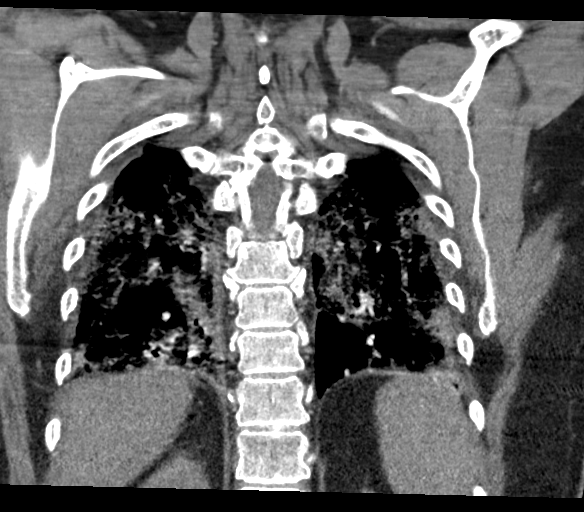

[19 of 46 positions shown; findings below may reference images not displayed]

FINDINGS: Cardiovascular: Satisfactory opacification of the pulmonary arteries
to the segmental level. No evidence of pulmonary embolism. Mild
cardiomegaly. No pericardial effusion. Atherosclerosis of thoracic
aorta is noted without aneurysm formation.

Mediastinum/Nodes: No enlarged mediastinal, hilar, or axillary lymph
nodes. Thyroid gland, trachea, and esophagus demonstrate no
significant findings.

Lungs/Pleura: No pneumothorax or pleural effusion is noted. Patchy
airspace opacities are noted throughout both lungs consistent with
multifocal pneumonia.

Upper Abdomen: No acute abnormality.

Musculoskeletal: No chest wall abnormality. No acute or significant
osseous findings.

Review of the MIP images confirms the above findings.
IMPRESSION: 1. No definite evidence of pulmonary embolus.
2. Patchy airspace opacities are noted throughout both lungs
consistent with multifocal pneumonia.
3. Aortic atherosclerosis.

Aortic Atherosclerosis ([id number].O).
